# Patient Record
Sex: Male | Born: 1960 | Race: White | Hispanic: No | Smoking: Former smoker
Health system: Southern US, Community
[De-identification: ages and names within clinical notes are randomized; demographics above are authoritative.]

## PROBLEM LIST (undated history)

## (undated) DIAGNOSIS — Z951 Presence of aortocoronary bypass graft: Secondary | ICD-10-CM

## (undated) DIAGNOSIS — I214 Non-ST elevation (NSTEMI) myocardial infarction: Secondary | ICD-10-CM

## (undated) DIAGNOSIS — E785 Hyperlipidemia, unspecified: Secondary | ICD-10-CM

## (undated) DIAGNOSIS — I779 Disorder of arteries and arterioles, unspecified: Secondary | ICD-10-CM

## (undated) DIAGNOSIS — E109 Type 1 diabetes mellitus without complications: Secondary | ICD-10-CM

## (undated) DIAGNOSIS — I739 Peripheral vascular disease, unspecified: Secondary | ICD-10-CM

## (undated) DIAGNOSIS — B1910 Unspecified viral hepatitis B without hepatic coma: Secondary | ICD-10-CM

## (undated) DIAGNOSIS — I2510 Atherosclerotic heart disease of native coronary artery without angina pectoris: Secondary | ICD-10-CM

## (undated) DIAGNOSIS — G43909 Migraine, unspecified, not intractable, without status migrainosus: Secondary | ICD-10-CM

## (undated) DIAGNOSIS — R030 Elevated blood-pressure reading, without diagnosis of hypertension: Secondary | ICD-10-CM

## (undated) DIAGNOSIS — R7301 Impaired fasting glucose: Secondary | ICD-10-CM

## (undated) HISTORY — DX: Morbid (severe) obesity due to excess calories: E66.01

## (undated) HISTORY — DX: Atherosclerotic heart disease of native coronary artery without angina pectoris: I25.10

## (undated) HISTORY — DX: Disorder of arteries and arterioles, unspecified: I77.9

## (undated) HISTORY — DX: Impaired fasting glucose: R73.01

## (undated) HISTORY — DX: Hyperlipidemia, unspecified: E78.5

## (undated) HISTORY — PX: ANTERIOR CERVICAL DECOMP/DISCECTOMY FUSION: SHX1161

## (undated) HISTORY — PX: BACK SURGERY: SHX140

## (undated) HISTORY — DX: Presence of aortocoronary bypass graft: Z95.1

## (undated) HISTORY — DX: Peripheral vascular disease, unspecified: I73.9

## (undated) HISTORY — DX: Elevated blood-pressure reading, without diagnosis of hypertension: R03.0

---

## 1998-06-23 ENCOUNTER — Emergency Department (HOSPITAL_COMMUNITY): Admission: EM | Admit: 1998-06-23 | Discharge: 1998-06-23 | Payer: Self-pay

## 2000-04-19 ENCOUNTER — Encounter: Payer: Self-pay | Admitting: Gastroenterology

## 2000-04-19 ENCOUNTER — Ambulatory Visit (HOSPITAL_COMMUNITY): Admission: RE | Admit: 2000-04-19 | Discharge: 2000-04-19 | Payer: Self-pay | Admitting: Gastroenterology

## 2001-12-08 ENCOUNTER — Encounter: Payer: Self-pay | Admitting: Emergency Medicine

## 2001-12-08 ENCOUNTER — Emergency Department (HOSPITAL_COMMUNITY): Admission: EM | Admit: 2001-12-08 | Discharge: 2001-12-08 | Payer: Self-pay | Admitting: Emergency Medicine

## 2014-09-03 ENCOUNTER — Ambulatory Visit (INDEPENDENT_AMBULATORY_CARE_PROVIDER_SITE_OTHER): Payer: BLUE CROSS/BLUE SHIELD | Admitting: Family Medicine

## 2014-09-03 VITALS — BP 138/86 | HR 97 | Temp 98.2°F | Resp 18 | Ht 71.0 in | Wt 301.0 lb

## 2014-09-03 DIAGNOSIS — E1165 Type 2 diabetes mellitus with hyperglycemia: Secondary | ICD-10-CM | POA: Diagnosis not present

## 2014-09-03 DIAGNOSIS — E118 Type 2 diabetes mellitus with unspecified complications: Secondary | ICD-10-CM

## 2014-09-03 DIAGNOSIS — E669 Obesity, unspecified: Secondary | ICD-10-CM | POA: Insufficient documentation

## 2014-09-03 DIAGNOSIS — IMO0002 Reserved for concepts with insufficient information to code with codable children: Secondary | ICD-10-CM | POA: Insufficient documentation

## 2014-09-03 MED ORDER — METFORMIN HCL 500 MG PO TABS: 500.0000 mg | ORAL_TABLET | Freq: Two times a day (BID) | ORAL | Status: DC

## 2014-09-03 NOTE — Patient Instructions (Signed)
We are going to start you back on treatment for your diabetes.  You do have diabetes for sure This is a chronic illness that generally does not go away, but which we will control with diet, exercise and medication as needed Please come by for fasting labs in 2 weeks or so.  You can come in just for a blood draw  In the meantime please start taking metformin. Take one in the evening for one week, then increase to twice a day  Please come and see me in one month in the office (you can make an appt if you like) to check on your progress.   As a diabetic, there are several things you can do to monitor your condition and maintain your health.  1. Check your feet daily for any skin breakdown 2. Exercise and keep track of your diet 3. Let us know before you run out of your medications 4. Get your annual flu shot, and ask if you need a pneumonia shot 5. Ask if you are up to date on your labs; you should have an A1c every 6 months, a urine protein test annually, and a cholesterol test annually.  Your doctor may decide to do labs more often if indicated 6. Take off your shoes and socks at each visit.  Be sure your doctor examines your feet.   7. Ask about your blood pressure.  Your goal is 130/ 80 or less 8. Get an annual eye exam.  Please ask your ophthalmologist to send us your report 9. Keep up with your dental cleanings and exams.

## 2014-09-03 NOTE — Progress Notes (Signed)
[facility] [address], [place] [id number] [phone]  Date:  09/03/2014   Name:  Joshua LeechFredrick F Hinchcliff   DOB:  11/28/1960   MRN:  [id number]  PCP:  No primary care provider on file.    Chief Complaint: establish pcp   History of Present Illness:  Joshua Howell is a 54 y.o. 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 However he was found to have glucose in his urine. A1c is 10%.  He is not currently on any medication.  He was first dx a year ago. Was on metformin for a little while but he is no longer taking this. His old doctor moved away, so he stopped following up.  He states that he was told he could stop the medication however prior to his doctor moving.   He is not fasting today; had a biscuit this am His wife of 30 years passed away 3 years ago; he tends to eat to deal with loneliness.   He is aware that he needs to lose weight and has been able to lose weight in the past  There are no active problems to display for this patient.   Past Medical History  Diagnosis Date  . Diabetes mellitus without complication     Past Surgical History  Procedure Laterality Date  . Neck surgery      History  Substance Use Topics  . Smoking status: Former Games developermoker  . Smokeless tobacco: Not on file  . Alcohol Use: Not on file    History reviewed. No pertinent family history.  No Known Allergies  Medication list has been reviewed and updated.  No current outpatient prescriptions on file prior to visit.   No current facility-administered medications on file prior to visit.    Review of Systems:  As per HPI- otherwise negative.   Physical Examination: Filed Vitals:   09/03/14 1223  BP: 138/86  Pulse: 97  Temp: 98.2 F (36.8 C)  Resp: 18   Filed Vitals:   09/03/14 1223  Height: 5\' 11"  (1.803 m)  Weight: 301 lb (136.533 kg)   Body mass index is 42 kg/(m^2). Ideal Body Weight: Weight in (lb) to  have BMI = 25: 178.9  GEN: WDWN, NAD, Non-toxic, A & O x 3, morbid obesity HEENT: Atraumatic, Normocephalic. Neck supple. No masses, No LAD. Ears and Nose: No external deformity. CV: RRR, No M/G/R. No JVD. No thrill. No extra heart sounds. PULM: CTA B, no wheezes, crackles, rhonchi. No retractions. No resp. distress. No accessory muscle use. ABD: S, NT, ND. No rebound. No HSM. EXTR: No c/c/e NEURO Normal gait.  PSYCH: Normally interactive. Conversant. Not depressed or anxious appearing.  Calm demeanor.    Assessment and Plan: Diabetes mellitus type 2, uncontrolled - Plan: metFORMIN (GLUCOPHAGE) 500 MG tablet, Comprehensive metabolic panel, Lipid panel  Morbid obesity  Here today for a DOT exam. Added a personal PE when he was found to have uncontrolled DM with A1c of 10%, not currently on any tx.  Will restart metformin 500 qd, increase to BID He is not fasting today; will have him come in for fasting labs as avove in 2 weeks, plan to recheck progress in clinic in one month  Signed Abbe AmsterdamJessica Phenix Vandermeulen, MD

## 2014-09-17 ENCOUNTER — Other Ambulatory Visit (INDEPENDENT_AMBULATORY_CARE_PROVIDER_SITE_OTHER): Payer: BLUE CROSS/BLUE SHIELD | Admitting: Radiology

## 2014-09-17 DIAGNOSIS — E1165 Type 2 diabetes mellitus with hyperglycemia: Secondary | ICD-10-CM

## 2014-09-17 DIAGNOSIS — IMO0002 Reserved for concepts with insufficient information to code with codable children: Secondary | ICD-10-CM

## 2014-09-17 LAB — COMPREHENSIVE METABOLIC PANEL
ALT: 23 U/L (ref 0–53)
AST: 18 U/L (ref 0–37)
Albumin: 4.4 g/dL (ref 3.5–5.2)
Alkaline Phosphatase: 46 U/L (ref 39–117)
BUN: 13 mg/dL (ref 6–23)
CO2: 27 mEq/L (ref 19–32)
Calcium: 9.3 mg/dL (ref 8.4–10.5)
Chloride: 101 mEq/L (ref 96–112)
Creat: 1.08 mg/dL (ref 0.50–1.35)
Glucose, Bld: 144 mg/dL — ABNORMAL HIGH (ref 70–99)
Potassium: 4.8 mEq/L (ref 3.5–5.3)
Sodium: 137 mEq/L (ref 135–145)
Total Bilirubin: 1.7 mg/dL — ABNORMAL HIGH (ref 0.2–1.2)
Total Protein: 7.4 g/dL (ref 6.0–8.3)

## 2014-09-17 LAB — LIPID PANEL
Cholesterol: 149 mg/dL (ref 0–200)
HDL: 40 mg/dL (ref >=40)
LDL Cholesterol: 72 mg/dL (ref 0–99)
Total CHOL/HDL Ratio: 3.7 Ratio
Triglycerides: 185 mg/dL — ABNORMAL HIGH (ref <150)
VLDL: 37 mg/dL (ref 0–40)

## 2014-09-17 NOTE — Progress Notes (Signed)
Lab only 

## 2014-09-18 ENCOUNTER — Encounter: Payer: Self-pay | Admitting: Family Medicine

## 2016-05-11 DIAGNOSIS — J0100 Acute maxillary sinusitis, unspecified: Secondary | ICD-10-CM | POA: Diagnosis not present

## 2016-05-30 ENCOUNTER — Institutional Professional Consult (permissible substitution): Payer: BLUE CROSS/BLUE SHIELD | Admitting: Medical

## 2016-06-06 ENCOUNTER — Ambulatory Visit (INDEPENDENT_AMBULATORY_CARE_PROVIDER_SITE_OTHER): Payer: BLUE CROSS/BLUE SHIELD | Admitting: Medical

## 2016-06-06 ENCOUNTER — Encounter: Payer: Self-pay | Admitting: Medical

## 2016-06-06 ENCOUNTER — Telehealth: Payer: Self-pay | Admitting: Medical

## 2016-06-06 VITALS — BP 140/98 | HR 86 | Ht 72.0 in | Wt 297.6 lb

## 2016-06-06 DIAGNOSIS — R7301 Impaired fasting glucose: Secondary | ICD-10-CM | POA: Diagnosis not present

## 2016-06-06 DIAGNOSIS — R009 Unspecified abnormalities of heart beat: Secondary | ICD-10-CM

## 2016-06-06 DIAGNOSIS — J0110 Acute frontal sinusitis, unspecified: Secondary | ICD-10-CM

## 2016-06-06 DIAGNOSIS — I10 Essential (primary) hypertension: Secondary | ICD-10-CM | POA: Diagnosis not present

## 2016-06-06 DIAGNOSIS — R0683 Snoring: Secondary | ICD-10-CM | POA: Diagnosis not present

## 2016-06-06 LAB — COMPREHENSIVE METABOLIC PANEL
ALT: 18 U/L (ref 9–46)
AST: 18 U/L (ref 10–35)
Albumin: 4.1 g/dL (ref 3.6–5.1)
Alkaline Phosphatase: 52 U/L (ref 40–115)
BUN: 13 mg/dL (ref 7–25)
CO2: 23 mmol/L (ref 20–31)
Calcium: 9.6 mg/dL (ref 8.6–10.3)
Chloride: 104 mmol/L (ref 98–110)
Creat: 1.08 mg/dL (ref 0.70–1.33)
Glucose, Bld: 129 mg/dL — ABNORMAL HIGH (ref 65–99)
Potassium: 4.3 mmol/L (ref 3.5–5.3)
Sodium: 137 mmol/L (ref 135–146)
Total Bilirubin: 1.5 mg/dL — ABNORMAL HIGH (ref 0.2–1.2)
Total Protein: 7.3 g/dL (ref 6.1–8.1)

## 2016-06-06 LAB — CBC
HEMATOCRIT: 48.6 % (ref 38.5–50.0)
HEMOGLOBIN: 16.4 g/dL (ref 13.2–17.1)
MCH: 31.9 pg (ref 27.0–33.0)
MCHC: 33.7 g/dL (ref 32.0–36.0)
MCV: 94.6 fL (ref 80.0–100.0)
MPV: 11.1 fL (ref 7.5–12.5)
Platelets: 227 10*3/uL (ref 140–400)
RBC: 5.14 MIL/uL (ref 4.20–5.80)
RDW: 13.6 % (ref 11.0–15.0)
WBC: 8.6 10*3/uL (ref 4.0–10.5)

## 2016-06-06 LAB — LIPID PANEL
Cholesterol: 168 mg/dL (ref <200)
HDL: 40 mg/dL — ABNORMAL LOW (ref >40)
LDL Cholesterol: 74 mg/dL (ref <100)
Total CHOL/HDL Ratio: 4.2 Ratio (ref <5.0)
Triglycerides: 268 mg/dL — ABNORMAL HIGH (ref <150)
VLDL: 54 mg/dL — ABNORMAL HIGH (ref <30)

## 2016-06-06 LAB — TSH: TSH: 1.86 mIU/L (ref 0.40–4.50)

## 2016-06-06 MED ORDER — AMOXICILLIN 500 MG PO TABS: ORAL_TABLET | ORAL | 0 refills | Status: DC

## 2016-06-06 NOTE — Progress Notes (Signed)
Subjective: Chief Complaint  Patient presents with  . new patient    new patient , needs sleep study for  work.   Here as a new patient.  Here with his daughter today.  Here to establish care.   Recently had  DOT physical, was advised he needed a sleep study, was given temporary clearance.    Works daily, does [employer].  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    No prior sleep study.  Has hx/o impaired glucose, elevated BPs, was  Metformin in the psat for elevated sugars.  Never been on BP medication.    Thinks he still has sinus infection.  Was seen by urgent care 2 weeks ago for same, but despite a round of antibiotic, it didn't clear up.  Still has sinus pressure, congestion, headache, post nasal drainage.  No fever, no NVD, no SOB.   Regarding sleep, no hx/o apnea, no daytime somnolence, no fatigue, but is known to snore, has 17" neck.    Past Medical History:  Diagnosis Date  . Elevated blood-pressure reading without diagnosis of hypertension   . Impaired fasting blood sugar   . Morbid obesity (HCC)    Current Outpatient Prescriptions on File Prior to Visit  Medication Sig Dispense Refill  . DiphenhydrAMINE HCl (BENADRYL ALLERGY PO) Take by mouth.    . metFORMIN (GLUCOPHAGE) 500 MG tablet Take 1 tablet (500 mg total) by mouth 2 (two) times daily with a meal. (Patient not taking: Reported on 06/06/2016) 180 tablet 3   No current facility-administered medications on file prior to visit.    ROS as in subjective   Objective: BP (!) 140/98 (BP Location: Right Arm, Patient Position: Sitting)   Pulse 86   Ht 6' (1.829 m)   Wt 297 lb 9.6 oz (135 kg)   SpO2 95%   BMI 40.36 kg/m   BP Readings from Last 3 Encounters:  06/06/16 (!) 140/98  09/03/14 138/86   Wt Readings from Last 3 Encounters:  06/06/16 297 lb 9.6 oz (135 kg)  09/03/14 (!) 301 lb (136.5 kg)   General appearance: Alert, WD/WN, no  distress                             Skin: warm, no rash                           Head: + frontal sinus tenderness,                            Eyes: conjunctiva normal, corneas clear, PERRLA                            Ears: pearly TMs, external ear canals normal                          Nose: septum midline, turbinates swollen, with erythema and clear discharge             Mouth/throat: MMM, tongue normal, mild pharyngeal erythema                           Neck: supple, no adenopathy, no thyromegaly, non tender  Heart: RRR, normal S1, S2, no murmurs                         Lungs: CTA bilaterally, no wheezes, rales, or rhonchi       Assessment: Encounter Diagnoses  Name Primary?  . Elevated heart rate with elevated blood pressure and diagnosis of hypertension Yes  . Morbid obesity (HCC)   . Snoring   . Impaired fasting blood sugar   . Acute frontal sinusitis, recurrence not specified     Plan: Discussed concerns.  Will pursue sleep study, labs today.  Discussed need to work on weight loss, healthy diet, monitoring BP.    Sinusitis - begin amoxicillin, rest, hydrate well.  [name] was seen today for new patient.  Diagnoses and all orders for this visit:  Elevated heart rate with elevated blood pressure and diagnosis of hypertension -     Comprehensive metabolic panel -     Lipid panel -     CBC -     TSH -     Hemoglobin A1c  Morbid obesity (HCC) -     Comprehensive metabolic panel -     Lipid panel -     CBC -     TSH -     Hemoglobin A1c  Snoring -     Comprehensive metabolic panel -     Lipid panel -     CBC -     TSH -     Hemoglobin A1c  Impaired fasting blood sugar -     Comprehensive metabolic panel -     Lipid panel -     CBC -     TSH -     Hemoglobin A1c  Acute frontal sinusitis, recurrence not specified  Other orders -     amoxicillin (AMOXIL) 500 MG tablet; 2 tablets po BID x 10 days

## 2016-06-06 NOTE — Telephone Encounter (Signed)
[facility]

## 2016-06-07 ENCOUNTER — Other Ambulatory Visit: Payer: Self-pay | Admitting: Medical

## 2016-06-07 ENCOUNTER — Other Ambulatory Visit: Payer: Self-pay

## 2016-06-07 DIAGNOSIS — E118 Type 2 diabetes mellitus with unspecified complications: Principal | ICD-10-CM

## 2016-06-07 DIAGNOSIS — IMO0002 Reserved for concepts with insufficient information to code with codable children: Secondary | ICD-10-CM

## 2016-06-07 DIAGNOSIS — E1165 Type 2 diabetes mellitus with hyperglycemia: Secondary | ICD-10-CM

## 2016-06-07 DIAGNOSIS — R0683 Snoring: Secondary | ICD-10-CM

## 2016-06-07 LAB — HEMOGLOBIN A1C
HEMOGLOBIN A1C: 7.8 % — AB (ref <5.7)
MEAN PLASMA GLUCOSE: 177 mg/dL

## 2016-06-07 MED ORDER — METFORMIN HCL 500 MG PO TABS: 500.0000 mg | ORAL_TABLET | Freq: Two times a day (BID) | ORAL | 2 refills | Status: DC

## 2016-06-07 NOTE — Telephone Encounter (Signed)
Sent  Referral for sleep study

## 2016-06-26 ENCOUNTER — Encounter: Payer: Self-pay | Admitting: Medical

## 2016-06-26 ENCOUNTER — Ambulatory Visit (INDEPENDENT_AMBULATORY_CARE_PROVIDER_SITE_OTHER): Payer: BLUE CROSS/BLUE SHIELD | Admitting: Medical

## 2016-06-26 VITALS — BP 140/88 | HR 86 | Wt 295.2 lb

## 2016-06-26 DIAGNOSIS — E782 Mixed hyperlipidemia: Secondary | ICD-10-CM | POA: Diagnosis not present

## 2016-06-26 DIAGNOSIS — R0683 Snoring: Secondary | ICD-10-CM

## 2016-06-26 DIAGNOSIS — E118 Type 2 diabetes mellitus with unspecified complications: Secondary | ICD-10-CM

## 2016-06-26 NOTE — Progress Notes (Signed)
Subjective: Chief Complaint  Patient presents with  . follow up dm , possible bloodwork    follow up dm    Here for f/u on recent visit.  I saw him on 06/06/16 for new patient regarding possible sleep apnea, elevated BP.   We did labs at that visit, and unfortunately labs were consisted with diabetes and abnormal lipids.   Here to discuss this.  When we called back about labs, we advised he begin Metformin, we also refereed for sleep study, and discussed need for weight loss.    No prior sleep study.  Has hx/o impaired glucose, elevated BPs, was  Metformin in the past for elevated sugars.  Never been on BP medication.    Regarding sleep, no hx/o apnea, no daytime somnolence, no fatigue, but is known to snore, has 17" neck.    Past Medical History:  Diagnosis Date  . Elevated blood-pressure reading without diagnosis of hypertension   . Impaired fasting blood sugar   . Morbid obesity (HCC)    Current Outpatient Prescriptions on File Prior to Visit  Medication Sig Dispense Refill  . metFORMIN (GLUCOPHAGE) 500 MG tablet Take 1 tablet (500 mg total) by mouth 2 (two) times daily with a meal. 180 tablet 2  . DiphenhydrAMINE HCl (BENADRYL ALLERGY PO) Take by mouth.     No current facility-administered medications on file prior to visit.    ROS as in subjective   Objective: BP 140/88   Pulse 86   Wt 295 lb 3.2 oz (133.9 kg)   SpO2 96%   BMI 40.04 kg/m   BP Readings from Last 3 Encounters:  06/26/16 140/88  06/06/16 (!) 140/98  09/03/14 138/86   Wt Readings from Last 3 Encounters:  06/26/16 295 lb 3.2 oz (133.9 kg)  06/06/16 297 lb 9.6 oz (135 kg)  09/03/14 (!) 301 lb (136.5 kg)   General appearance: Alert, WD/WN, no distress                             Assessment: Encounter Diagnoses  Name Primary?  . Diabetes mellitus with complication (HCC) Yes  . Mixed dyslipidemia   . Morbid obesity (HCC)   . Snoring      Plan: Diabetes - discussed diagnosis, possible  complications, diet, exercise, need for lifestyle changes.   discussed medications recommendations today.   C/t metformin that we just started him back on, gave script for glucometer and discussed daily testing.  F/u 79mo.  Mixed dyslipidemia - advised diet changes, c/t physical activity.  obesity - advised needed lifestyle changes, weight loss  Snoring, obesity, elevated BPs .  He declines sleep study for now.   Joshua Howell was seen today for follow up dm , possible bloodwork.  Diagnoses and all orders for this visit:  Diabetes mellitus with complication (HCC)  Mixed dyslipidemia  Morbid obesity (HCC)  Snoring

## 2016-06-26 NOTE — Patient Instructions (Signed)
Type 2 Diabetes  Diabetes is a long-lasting (chronic) disease.  With diabetes, either the pancreas does not make enough of a hormone called insulin, or the body has trouble using the insulin that is made.  Over time, diabetes can damage the eyes, kidneys, and nerves causing retinopathy, nephropathy, and neuropathy.  Diabetes puts you at risk for heart disease and peripheral vascular disease which can lead to heart attack, stroke, foot ulcers, and amputations.    Our goal and hopefully your goal is to manage your diabetes in such a way to slow the progression of the disease and do all we can to keep you healthy  Home Care:   Eat healthy, exercise regularly, limit alcohol, and don't smoke!  Check your blood sugar (glucose) once a day before breakfast, or as indicated by our discussion today.  Take your medications daily, don't run out of medications.  Learn about low blood sugar (hypoglycemia). Know how to treat it.  Wear a necklace or bracelet that says you have diabetes.  Check your feet every night for cuts, sores, blisters, and redness. Tell your medical provider if you have problems.  Maintain a normal body weight, or normal BMI - height to weight ratio of 20-25.  Ask me about this.  GET HELP RIGHT AWAY IF:  You have trouble keeping your blood sugar in target range.  You have problems with your medicines.  You are sick and not getting better after 24 hours.  You have a sore or wound that is not healing.  You have vision problems or changes.  You have a fever.  Diet: healthy diabetic diet as we discussed  Exercise regularly since it has beneficial effects on the heart and blood sugars. Exercising at least three times per week or 150 minutes per week can be as important as medication to a diabetic.  Find some form of exercise that you will enjoy doing regularly.  This can include walking, biking, kayaking, golfing, swimming, dance, aerobics, hiking, etc.  If you have joint  problems, many local gyms have equipment to accommodate people with specific needs.    Vaccinations:  Diabetics are at increased risk for infection, and illnesses can take longer to resolve.  Current vaccine recommendations include yearly Influenza (flu) vaccine (recommended in October), Pneumococcal vaccine, Hepatitis B vaccine series, Tdap (tetanus, diptheria and pertussis) vaccine every 10 years, and other age appropriate vaccinations.     Office visits:  We recommend routine medical care to make sure we are addressing prevention and issues as they arise.  Typically this could mean twice yearly or up to quarterly depending upon your unique health situation.  Exams should include a yearly physical, a yearly foot exam, and other examination as appropriate.  You should see an eye doctor yearly to help screen for and prevent blood vessel complications in your eyes.  Labs: Diabetics should have blood work done at least twice yearly to monitor your Hemoglobin A1C (a three-month average of your blood sugars) and your cholesterol.  You should have your urine and blood checked yearly to screen for kidney damage.  This may include creatinine and micro-albumin levels.  Other labs as appropriate.    Blood pressure goals:  Goal blood pressure in diabetics should be 130/80 or less. Monitoring your blood pressure with a home blood pressure cuff of your own is an excellent idea.  If you are prescribed medication for blood pressure, take your medication every day, and don't run out of medication.  Having high  blood pressure can damage your heart, eyes, kidneys, and put you at risk for heart attack and stroke.  Tobacco use:  If you smoke, dip or chew, quitting will reduce your risk of heart attack, stroke, peripheral vascular disease, and many cancers.    Diabetic Report Card for Joshua Howell  June 26, 2016  Below is a summary of recent tests related to your diabetes that can help you manage your health.    Hemoglobin A1C:  Your Hemoglobin A1C values should be less than 7. If these are greater than 7, you have a higher chance of having eye, heart, and kidney problems in the future.   Your most recent Hemoglobin A1C values were:  Hgb A1c MFr Bld (%)  Date Value  06/06/2016 7.8 (H)     Cholesterol:  Your LDL Cholesterol (bad cholesterol) values should be less than 100 mg/dL if you do not have cardiovascular disease.  The LDL should be less than 70 mg/dL if you do have cardiovascular disease.  If your LDL is consistently higher than 100 mg/dL, then your risk of heart attack and stroke increases yearly.   Your most recent LDL Cholesterol (bad cholesterol) results were:  LDL Cholesterol (mg/dL)  Date Value  16/02/9603 74  09/17/2014 72     Your HDL Cholesterol (good cholesterol) values should be higher than 40 mg/dL.  If your HDL is lower than 40 mg/dL, this increases your risk of heart attack and stroke.   Your most recent HDL Cholesterol (bad cholesterol) results were:  Lab Results  Component Value Date   CHOL 168 06/06/2016   HDL 40 (L) 06/06/2016   LDLCALC 74 06/06/2016   TRIG 268 (H) 06/06/2016   CHOLHDL 4.2 06/06/2016       Blood Pressure:  Your blood pressure values should be less than 130/80. Please contact me if your readings at home are consistently higher than this.   Your most recent blood pressure readings at our clinic were:  BP Readings from Last 3 Encounters:  06/26/16 140/88  06/06/16 (!) 140/98  09/03/14 138/86

## 2016-07-12 DIAGNOSIS — E109 Type 1 diabetes mellitus without complications: Secondary | ICD-10-CM

## 2016-07-12 HISTORY — DX: Type 1 diabetes mellitus without complications: E10.9

## 2016-09-24 ENCOUNTER — Other Ambulatory Visit: Payer: Self-pay

## 2016-09-24 ENCOUNTER — Encounter: Payer: Self-pay | Admitting: Medical

## 2016-09-24 ENCOUNTER — Ambulatory Visit (INDEPENDENT_AMBULATORY_CARE_PROVIDER_SITE_OTHER): Payer: BLUE CROSS/BLUE SHIELD | Admitting: Medical

## 2016-09-24 VITALS — BP 132/78 | HR 98 | Wt 289.6 lb

## 2016-09-24 DIAGNOSIS — E118 Type 2 diabetes mellitus with unspecified complications: Secondary | ICD-10-CM

## 2016-09-24 DIAGNOSIS — IMO0002 Reserved for concepts with insufficient information to code with codable children: Secondary | ICD-10-CM

## 2016-09-24 DIAGNOSIS — E782 Mixed hyperlipidemia: Secondary | ICD-10-CM | POA: Diagnosis not present

## 2016-09-24 DIAGNOSIS — E1165 Type 2 diabetes mellitus with hyperglycemia: Secondary | ICD-10-CM

## 2016-09-24 LAB — POCT GLYCOSYLATED HEMOGLOBIN (HGB A1C)

## 2016-09-24 MED ORDER — METFORMIN HCL 500 MG PO TABS: 500.0000 mg | ORAL_TABLET | Freq: Two times a day (BID) | ORAL | 2 refills | Status: DC

## 2016-09-24 NOTE — Progress Notes (Signed)
Subjective: Chief Complaint  Patient presents with  . Diabetes    diabetes check    Here for f/u.   Last visit we discussed his new diagnosis of diabetes and elevated lipids.   He has been compliant with Metformin 500mg  BID, tolerating it well.  He has made diet changes, less bread, less calories, is active on the job with exercise.  He is monitoring glucose, getting 100-166.   He has dropped a pant size and losing some weight.   No foot concerns.   No polyuria, no polydipsia.   Drinks coke zero.  No other aggravating or relieving factors. No other complaint.  Past Medical History:  Diagnosis Date  . Diabetes mellitus without complication (HCC) 07/2016  . Elevated blood-pressure reading without diagnosis of hypertension   . Impaired fasting blood sugar   . Morbid obesity (HCC)    Current Outpatient Prescriptions on File Prior to Visit  Medication Sig Dispense Refill  . DiphenhydrAMINE HCl (BENADRYL ALLERGY PO) Take by mouth.     No current facility-administered medications on file prior to visit.    ROS as in subjective    Objective: BP 132/78   Pulse 98   Wt 289 lb 9.6 oz (131.4 kg)   SpO2 96%   BMI 39.28 kg/m   BP Readings from Last 3 Encounters:  09/24/16 132/78  06/26/16 140/88  06/06/16 (!) 140/98   Wt Readings from Last 3 Encounters:  09/24/16 289 lb 9.6 oz (131.4 kg)  06/26/16 295 lb 3.2 oz (133.9 kg)  06/06/16 297 lb 9.6 oz (135 kg)   General appearance: alert, no distress, WD/WN,  HEENT: normocephalic, sclerae anicteric, TMs pearly, nares patent, no discharge or erythema, pharynx normal Oral cavity: MMM, no lesions Neck: supple, no lymphadenopathy, no thyromegaly, no masses Heart: RRR, normal S1, S2, no murmurs Lungs: CTA bilaterally, no wheezes, rhonchi, or rales Ext: no edema Pulses: 2+ symmetric, upper and lower extremities, normal cap refill      Assessment: Encounter Diagnoses  Name Primary?  . Diabetes mellitus with complication (HCC) Yes  .  Mixed dyslipidemia   . Morbid obesity (HCC)   . Uncontrolled type 2 diabetes mellitus with complication, without long-term current use of insulin (HCC)       Plan Congratulated him on his recent diet changes, weight loss, compliance with medications, glucose monitoring.   C/t efforts.  Hgba1C improved today.  C/t metformin but add samples of Jardiance.  Advised he call within a month regarding glucose readings and additional weight loss.     Joshua MendFredrick was seen today for diabetes.  Diagnoses and all orders for this visit:  Diabetes mellitus with complication (HCC) -     HgB A1c  Mixed dyslipidemia  Morbid obesity (HCC)  Uncontrolled type 2 diabetes mellitus with complication, without long-term current use of insulin (HCC) -     metFORMIN (GLUCOPHAGE) 500 MG tablet; Take 1 tablet (500 mg total) by mouth 2 (two) times daily with a meal.  Other orders -     empagliflozin (JARDIANCE) 10 MG TABS tablet; Take 10 mg by mouth daily.

## 2016-09-25 ENCOUNTER — Encounter: Payer: Self-pay | Admitting: Medical

## 2016-09-25 MED ORDER — METFORMIN HCL 500 MG PO TABS: 500.0000 mg | ORAL_TABLET | Freq: Two times a day (BID) | ORAL | 2 refills | Status: DC

## 2016-09-25 MED ORDER — EMPAGLIFLOZIN 10 MG PO TABS: 10.0000 mg | ORAL_TABLET | Freq: Every day | ORAL | 0 refills | Status: DC

## 2017-01-23 ENCOUNTER — Emergency Department (HOSPITAL_COMMUNITY): Payer: BLUE CROSS/BLUE SHIELD

## 2017-01-23 ENCOUNTER — Encounter: Payer: Self-pay | Admitting: Medical

## 2017-01-23 ENCOUNTER — Ambulatory Visit (INDEPENDENT_AMBULATORY_CARE_PROVIDER_SITE_OTHER): Payer: BLUE CROSS/BLUE SHIELD | Admitting: Medical

## 2017-01-23 ENCOUNTER — Encounter (HOSPITAL_COMMUNITY): Payer: Self-pay | Admitting: Emergency Medicine

## 2017-01-23 ENCOUNTER — Inpatient Hospital Stay (HOSPITAL_COMMUNITY)
Admission: EM | Admit: 2017-01-23 | Discharge: 2017-02-01 | DRG: 234 | Disposition: A | Discharge status: Home / Self Care | Payer: BLUE CROSS/BLUE SHIELD | Attending: Thoracic Surgery (Cardiothoracic Vascular Surgery) | Admitting: Thoracic Surgery (Cardiothoracic Vascular Surgery)

## 2017-01-23 VITALS — BP 144/92 | HR 90 | Wt 290.8 lb

## 2017-01-23 DIAGNOSIS — E669 Obesity, unspecified: Secondary | ICD-10-CM | POA: Diagnosis present

## 2017-01-23 DIAGNOSIS — J9811 Atelectasis: Secondary | ICD-10-CM | POA: Diagnosis not present

## 2017-01-23 DIAGNOSIS — Z7984 Long term (current) use of oral hypoglycemic drugs: Secondary | ICD-10-CM

## 2017-01-23 DIAGNOSIS — J90 Pleural effusion, not elsewhere classified: Secondary | ICD-10-CM | POA: Diagnosis not present

## 2017-01-23 DIAGNOSIS — E1169 Type 2 diabetes mellitus with other specified complication: Secondary | ICD-10-CM | POA: Diagnosis not present

## 2017-01-23 DIAGNOSIS — E1165 Type 2 diabetes mellitus with hyperglycemia: Secondary | ICD-10-CM | POA: Diagnosis present

## 2017-01-23 DIAGNOSIS — E782 Mixed hyperlipidemia: Secondary | ICD-10-CM | POA: Diagnosis not present

## 2017-01-23 DIAGNOSIS — D696 Thrombocytopenia, unspecified: Secondary | ICD-10-CM | POA: Diagnosis not present

## 2017-01-23 DIAGNOSIS — Z87891 Personal history of nicotine dependence: Secondary | ICD-10-CM | POA: Diagnosis not present

## 2017-01-23 DIAGNOSIS — Z8249 Family history of ischemic heart disease and other diseases of the circulatory system: Secondary | ICD-10-CM | POA: Diagnosis not present

## 2017-01-23 DIAGNOSIS — E871 Hypo-osmolality and hyponatremia: Secondary | ICD-10-CM | POA: Diagnosis not present

## 2017-01-23 DIAGNOSIS — R0600 Dyspnea, unspecified: Secondary | ICD-10-CM | POA: Insufficient documentation

## 2017-01-23 DIAGNOSIS — I214 Non-ST elevation (NSTEMI) myocardial infarction: Secondary | ICD-10-CM

## 2017-01-23 DIAGNOSIS — Z79899 Other long term (current) drug therapy: Secondary | ICD-10-CM | POA: Diagnosis not present

## 2017-01-23 DIAGNOSIS — Z951 Presence of aortocoronary bypass graft: Secondary | ICD-10-CM | POA: Diagnosis not present

## 2017-01-23 DIAGNOSIS — E118 Type 2 diabetes mellitus with unspecified complications: Secondary | ICD-10-CM

## 2017-01-23 DIAGNOSIS — I2510 Atherosclerotic heart disease of native coronary artery without angina pectoris: Secondary | ICD-10-CM | POA: Diagnosis not present

## 2017-01-23 DIAGNOSIS — Z01810 Encounter for preprocedural cardiovascular examination: Secondary | ICD-10-CM | POA: Diagnosis not present

## 2017-01-23 DIAGNOSIS — R0602 Shortness of breath: Secondary | ICD-10-CM | POA: Diagnosis not present

## 2017-01-23 DIAGNOSIS — R079 Chest pain, unspecified: Secondary | ICD-10-CM | POA: Diagnosis not present

## 2017-01-23 DIAGNOSIS — D62 Acute posthemorrhagic anemia: Secondary | ICD-10-CM | POA: Diagnosis not present

## 2017-01-23 DIAGNOSIS — IMO0002 Reserved for concepts with insufficient information to code with codable children: Secondary | ICD-10-CM | POA: Diagnosis present

## 2017-01-23 DIAGNOSIS — Z6838 Body mass index (BMI) 38.0-38.9, adult: Secondary | ICD-10-CM

## 2017-01-23 DIAGNOSIS — I081 Rheumatic disorders of both mitral and tricuspid valves: Secondary | ICD-10-CM | POA: Diagnosis not present

## 2017-01-23 DIAGNOSIS — R0609 Other forms of dyspnea: Secondary | ICD-10-CM | POA: Diagnosis not present

## 2017-01-23 DIAGNOSIS — I25110 Atherosclerotic heart disease of native coronary artery with unstable angina pectoris: Secondary | ICD-10-CM | POA: Diagnosis not present

## 2017-01-23 DIAGNOSIS — I10 Essential (primary) hypertension: Secondary | ICD-10-CM | POA: Diagnosis not present

## 2017-01-23 DIAGNOSIS — Z09 Encounter for follow-up examination after completed treatment for conditions other than malignant neoplasm: Secondary | ICD-10-CM

## 2017-01-23 DIAGNOSIS — Z981 Arthrodesis status: Secondary | ICD-10-CM | POA: Diagnosis not present

## 2017-01-23 DIAGNOSIS — Z4682 Encounter for fitting and adjustment of non-vascular catheter: Secondary | ICD-10-CM | POA: Diagnosis not present

## 2017-01-23 HISTORY — DX: Type 1 diabetes mellitus without complications: E10.9

## 2017-01-23 HISTORY — DX: Migraine, unspecified, not intractable, without status migrainosus: G43.909

## 2017-01-23 HISTORY — DX: Unspecified viral hepatitis B without hepatic coma: B19.10

## 2017-01-23 HISTORY — DX: Non-ST elevation (NSTEMI) myocardial infarction: I21.4

## 2017-01-23 LAB — BASIC METABOLIC PANEL
Anion gap: 8 (ref 5–15)
BUN: 15 mg/dL (ref 6–20)
CO2: 25 mmol/L (ref 22–32)
Calcium: 9.3 mg/dL (ref 8.9–10.3)
Chloride: 102 mmol/L (ref 101–111)
Creatinine, Ser: 1.21 mg/dL (ref 0.61–1.24)
GFR calc Af Amer: >60 mL/min (ref >60)
GFR calc non Af Amer: >60 mL/min (ref >60)
Glucose, Bld: 172 mg/dL — ABNORMAL HIGH (ref 65–99)
Potassium: 4.4 mmol/L (ref 3.5–5.1)
Sodium: 135 mmol/L (ref 135–145)

## 2017-01-23 LAB — GLUCOSE, CAPILLARY: GLUCOSE-CAPILLARY: 193 mg/dL — AB (ref 65–99)

## 2017-01-23 LAB — I-STAT TROPONIN, ED: Troponin i, poc: 1.61 ng/mL (ref 0.00–0.08)

## 2017-01-23 LAB — CBC
HCT: 49 % (ref 39.0–52.0)
Hemoglobin: 16.6 g/dL (ref 13.0–17.0)
MCH: 32.4 pg (ref 26.0–34.0)
MCHC: 33.9 g/dL (ref 30.0–36.0)
MCV: 95.7 fL (ref 78.0–100.0)
Platelets: 188 10*3/uL (ref 150–400)
RBC: 5.12 MIL/uL (ref 4.22–5.81)
RDW: 12.9 % (ref 11.5–15.5)
WBC: 8.6 10*3/uL (ref 4.0–10.5)

## 2017-01-23 MED ORDER — ASPIRIN 81 MG PO CHEW
324.0000 mg | CHEWABLE_TABLET | Freq: Once | ORAL | Status: AC
  Administered 2017-01-23: 324 mg via ORAL
  Filled 2017-01-23: qty 4

## 2017-01-23 MED ORDER — NITROGLYCERIN 0.4 MG SL SUBL: 0.4000 mg | SUBLINGUAL_TABLET | SUBLINGUAL | Status: DC | PRN

## 2017-01-23 MED ORDER — ASPIRIN EC 81 MG PO TBEC
81.0000 mg | DELAYED_RELEASE_TABLET | Freq: Every day | ORAL | Status: DC
  Administered 2017-01-25 – 2017-01-27 (×3): 81 mg via ORAL
  Filled 2017-01-23 (×3): qty 1

## 2017-01-23 MED ORDER — ATORVASTATIN CALCIUM 80 MG PO TABS
80.0000 mg | ORAL_TABLET | Freq: Every day | ORAL | Status: DC
  Administered 2017-01-24 – 2017-01-31 (×7): 80 mg via ORAL
  Filled 2017-01-23 (×7): qty 1

## 2017-01-23 MED ORDER — ACETAMINOPHEN 325 MG PO TABS: 650.0000 mg | ORAL_TABLET | ORAL | Status: DC | PRN

## 2017-01-23 MED ORDER — HEPARIN (PORCINE) IN NACL 100-0.45 UNIT/ML-% IJ SOLN
1200.0000 [IU]/h | INTRAMUSCULAR | Status: DC
  Administered 2017-01-23: 1200 [IU]/h via INTRAVENOUS
  Filled 2017-01-23: qty 250

## 2017-01-23 MED ORDER — METOPROLOL TARTRATE 12.5 MG HALF TABLET
12.5000 mg | ORAL_TABLET | Freq: Two times a day (BID) | ORAL | Status: DC
  Administered 2017-01-23 – 2017-01-27 (×9): 12.5 mg via ORAL
  Filled 2017-01-23 (×9): qty 1

## 2017-01-23 MED ORDER — HEPARIN BOLUS VIA INFUSION
4000.0000 [IU] | Freq: Once | INTRAVENOUS | Status: AC
  Administered 2017-01-23: 4000 [IU] via INTRAVENOUS
  Filled 2017-01-23: qty 4000

## 2017-01-23 MED ORDER — ONDANSETRON HCL 4 MG/2ML IJ SOLN: 4.0000 mg | Freq: Four times a day (QID) | INTRAMUSCULAR | Status: DC | PRN

## 2017-01-23 NOTE — Progress Notes (Signed)
ANTICOAGULATION CONSULT NOTE - Initial Consult  Pharmacy Consult for heparin Indication: chest pain/ACS  No Known Allergies  Patient Measurements:   Heparin Dosing Weight: 99 Kg  Vital Signs: Temp: 97.9 F (36.6 C) (09/12 1641) Temp Source: Oral (09/12 1641) BP: 124/72 (09/12 1641) Pulse Rate: 94 (09/12 1641)  Labs:  Recent Labs  01/23/17 1651  HGB 16.6  HCT 49.0  PLT 188  CREATININE 1.21    CrCl cannot be calculated (Unknown ideal weight.).   Medical History: Past Medical History:  Diagnosis Date  . Diabetes mellitus without complication (HCC) 07/2016  . Elevated blood-pressure reading without diagnosis of hypertension   . Impaired fasting blood sugar   . Morbid obesity (HCC)    Assessment: 56 yoM sent from urgent care with abnormal EKG and chest pain earlier this week. No anticoagulation PTA; Troponin 1.61; CBC WNL  Goal of Therapy:  Heparin level 0.3-0.7 units/ml Monitor platelets by anticoagulation protocol: Yes   Plan:  Give 4000 units bolus x 1 Start heparin infusion at 1200 units/hr Check anti-Xa level in 6 hours and daily while on heparin Continue to monitor H&H and platelets  Sheyli Horwitz L Vestal Crandall 01/23/2017,6:17 PM

## 2017-01-23 NOTE — ED Triage Notes (Signed)
Pt reports feeling like he had indigestion yesterday with some pain in upper back, saw his pcp today who did an ekg and sent him for further evaluation due to ekg looking abnormal. Pt denies pain today, resp e/u, nad.

## 2017-01-23 NOTE — H&P (Signed)
History & Physical    Patient ID: Joshua LeechFredrick F Thum MRN: [id number], DOB/AGE: 58/09/1960   Admit date: 01/23/2017   Primary Physician: Jac Canavanysinger, David S, PA-C Primary Cardiologist: None  Patient Profile    56 y o man with NSTEMI  Past Medical History    Past Medical History:  Diagnosis Date  . Diabetes mellitus without complication (HCC) 07/2016  . Elevated blood-pressure reading without diagnosis of hypertension   . Impaired fasting blood sugar   . Morbid obesity (HCC)     Past Surgical History:  Procedure Laterality Date  . NECK SURGERY       Allergies  No Known Allergies  History of Present Illness    Mr Leonie GreenLingle has DM otherwise no sign PMH. 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 Was on the way to work on Monday when he developed chest burning. Lasted for half a day. Nothing ,ade it better or worse. He came ot ED today upon urging from his friend. He went to urgent care first. There they were concerned about his ECG and [facility]. IN the ED he is pain free. No SOB, PND, orthopnea or LEE.   He visits with PCP regularly and has "ok" lipids, BP and A1c.   Home Medications    Prior to Admission medications   Medication Sig Start Date End Date Taking? Authorizing Provider  DiphenhydrAMINE HCl (BENADRYL ALLERGY PO) Take 25 mg by mouth as needed (allergies).    Yes [provider]  metFORMIN (GLUCOPHAGE) 500 MG tablet Take 1 tablet (500 mg total) by mouth 2 (two) times daily with a meal. 09/25/16  Yes Tysinger, Kermit Baloavid S, PA-C    Family History    Family History  Problem Relation Age of Onset  . Gallbladder disease Mother   . Aneurysm Mother        brain  . Stroke Mother   . Heart disease Father 55       3 vessel ABG  . Heart disease Brother 45       MI  . Other Brother        died of hemorrhage in head after fall    Social History    Social History   Social History  . Marital status: Married    Spouse name: N/A  . Number of children: N/A  .  Years of education: N/A   Occupational History  . Not on file.   Social History Main Topics  . Smoking status: Former Games developermoker  . Smokeless tobacco: Never Used  . Alcohol use No  . Drug use: No  . Sexual activity: Not on file   Other Topics Concern  . Not on file   Social History Narrative  . No narrative on file     Review of Systems    General:  No chills, fever, night sweats or weight changes.  Cardiovascular:  No chest pain, dyspnea on exertion, edema, orthopnea, palpitations, paroxysmal nocturnal dyspnea. Dermatological: No rash, lesions/masses Respiratory: No cough, dyspnea Urologic: No hematuria, dysuria Abdominal:   No nausea, vomiting, diarrhea, bright red blood per rectum, melena, or hematemesis Neurologic:  No visual changes, wkns, changes in mental status. All other systems reviewed and are otherwise negative except as noted above.  Physical Exam    Blood pressure 124/74, pulse 89, temperature 97.9 F (36.6 C), temperature source Oral, resp. rate 15, height 6' 0.05" (1.83 m), SpO2 97 %.  General: Pleasant, NAD Psych: Normal affect. Neuro: Alert and oriented X 3. Moves  all extremities spontaneously. HEENT: Normal  Neck: Supple without bruits or JVD. Lungs:  Resp regular and unlabored, CTA. Heart: RRR no s3, s4, or murmurs. Abdomen: Soft, non-tender, non-distended, BS + x 4.  Extremities: No clubbing, cyanosis or edema. DP/PT/Radials 2+ and equal bilaterally.  Labs    Troponin [facility] Test)  Recent Labs  01/23/17 1657  TROPIPOC 1.61*   No results for input(s): CKTOTAL, CKMB, TROPONINI in the last 72 hours. Lab Results  Component Value Date   WBC 8.6 01/23/2017   HGB 16.6 01/23/2017   HCT 49.0 01/23/2017   MCV 95.7 01/23/2017   PLT 188 01/23/2017    Recent Labs Lab 01/23/17 1651  NA 135  K 4.4  CL 102  CO2 25  BUN 15  CREATININE 1.21  CALCIUM 9.3  GLUCOSE 172*   Lab Results  Component Value Date   CHOL 168 06/06/2016   HDL 40  (L) 06/06/2016   LDLCALC 74 06/06/2016   TRIG 268 (H) 06/06/2016   No results found for: [facility]   Radiology Studies    Dg Chest 2 View  Result Date: 01/23/2017 CLINICAL DATA:  56 year old male with abnormal EKG. Epigastric pain, nausea and shortness of breath. EXAM: CHEST  2 VIEW COMPARISON:  None. FINDINGS: Lung volumes are at the upper limits of normal to mildly hyperinflated with some attenuation of upper lobe bronchovascular markings. Cardiac size and mediastinal contours are within normal limits. Visualized tracheal air column is within normal limits. No pneumothorax, pulmonary edema, pleural effusion or confluent pulmonary opacity. Mildly increased interstitial markings in both lungs. Osteopenia. No acute osseous abnormality identified. Negative visible bowel gas pattern. IMPRESSION: 1.  No acute cardiopulmonary abnormality. 2. Borderline to mild pulmonary hyperinflation with questionable upper lobe emphysema. Electronically Signed   By: Odessa Fleming M.D.   On: 01/23/2017 17:11    ECG & Cardiac Imaging    NSR, borderline ST elevation in I AVL  Assessment & Plan    Mr Brookover has a PMH of DM. No with NSTEMI. He has some high risk findings on ECG not fully meeting STEMI criteria. His trop is ~1. He is pain free.   Plan: - Admit to cardiology - TSH, A1c - Heparin drip, npo after MN. Initiated BB and Statin. ASA given - LHC tomorrow.   Signed, Macario Golds, MD 01/23/2017, 9:11 PM

## 2017-01-23 NOTE — ED Notes (Addendum)
Informed first nurse Arline AspCindy of troponin 1.61ng/mL

## 2017-01-23 NOTE — Progress Notes (Signed)
Subjective:  Joshua Howell is a 56 y.o. male who presents for chest pain.    Monday was at work, felt central chest pressure, SOB, nausea, lasted for hours, felt fatigue, coworker said he looked pale.   He rested, and after lunch felt a little better.  Thought it was indigestion.   Has felt relatively ok yesterday and today.  However, he reports DOE for the past month, relieved with rest.   No other specific chest pain events.     No hx/o HTN.  Diabetes diagnosed this year, recent glucose 135-140.    compliant with metformin.   He is a former smoker.    No other aggravating or relieving factors.    No other c/o.  The following portions of the patient's history were reviewed and updated as appropriate: allergies, current medications, past family history, past medical history, past social history, past surgical history and problem list.  ROS Otherwise as in subjective above   Past Medical History:  Diagnosis Date  . Diabetes mellitus without complication (HCC) 07/2016  . Elevated blood-pressure reading without diagnosis of hypertension   . Impaired fasting blood sugar   . Morbid obesity (HCC)    Current Outpatient Prescriptions on File Prior to Visit  Medication Sig Dispense Refill  . DiphenhydrAMINE HCl (BENADRYL ALLERGY PO) Take by mouth.    . metFORMIN (GLUCOPHAGE) 500 MG tablet Take 1 tablet (500 mg total) by mouth 2 (two) times daily with a meal. 180 tablet 2   No current facility-administered medications on file prior to visit.     Objective:Exam BP (!) 144/92   Pulse 90   Wt 290 lb 12.8 oz (131.9 kg)   SpO2 96%   BMI 39.44 kg/m   General appearance: alert, no distress, WD/WN, morbidly obese white male HEENT: normocephalic, sclerae anicteric, conjunctiva pink and moist, TMs pearly, nares patent, no discharge or erythema, pharynx normal Oral cavity: MMM, no lesions Neck: supple, no lymphadenopathy, no  thyromegaly, no masses Heart: RRR, normal S1, S2, no murmurs Lungs: CTA bilaterally, no wheezes, rhonchi, or rales Abdomen: +bs, soft, non tender, non distended, no masses, no hepatomegaly, no splenomegaly Pulses: 2+ radial pulses, 2+ pedal pulses, normal cap refill Ext: no edema Somewhat flushed in face, ext a little pale    Adult ECG Report  Indication: chest pain, DOE  Rate: 85 bpm  Rhythm: normal sinus rhythm and sinus arrhythmia  QRS Axis: 71 degrees  PR Interval:  QRS Duration:  QTc:  Conduction Disturbances: widened QRS in precordial leads, depression of ST wave III, possible IVCD  Other Abnormalities: none  Patient's cardiac risk factors are: advanced age (older than 25 for men, 23 for women), diabetes mellitus, family history of premature cardiovascular disease, male gender and obesity (BMI >= 30 kg/m2).  EKG comparison: none  Narrative Interpretation: abnormal EKG    Assessment: Encounter Diagnoses  Name Primary?  . Chest pain, unspecified type Yes  . DOE (dyspnea on exertion)   . Diabetes mellitus with complication (HCC)   . Mixed dyslipidemia   . Morbid obesity (HCC)   . Family history of premature CAD      Plan: Discussed abnormal EKG findings, no prior EKG to compare.   He hasn't had symptoms in the last 2 days but for the past month DOE, and significant chest pain Monday.    He has several cardiac risk factors, including male age 50yo, diabetic, morbid obesity, premature family hx/o of CAD.   After discussed  symptoms , he will report to the ED for evaluation now.  Declines EMS.      Follow up: to ED  Joshua Howell was seen today for chest pain.  Diagnoses and all orders for this visit:  Chest pain, unspecified type -     EKG 12-Lead -     Pulse oximetry (single); Future  DOE (dyspnea on exertion) -     EKG 12-Lead -     Pulse oximetry (single); Future  Diabetes mellitus with complication (HCC) -     EKG 12-Lead -     Pulse oximetry  (single); Future  Mixed dyslipidemia -     EKG 12-Lead -     Pulse oximetry (single); Future  Morbid obesity (HCC) -     EKG 12-Lead -     Pulse oximetry (single); Future  Family history of premature CAD -     EKG 12-Lead -     Pulse oximetry (single); Future

## 2017-01-23 NOTE — ED Provider Notes (Signed)
[facility]-EMERGENCY DEPT Provider Note   CSN: [id number] Arrival date & time: 01/23/17  1624     History   Chief Complaint Chief Complaint  Patient presents with  . Abnormal ECG    HPI Joshua Howell is a 56 y.o. male.  HPI   56 year old male with chest pain. Patient states for the last several weeks he has had dyspnea with exertion has been unusual for him. Monday he was driving his car when he had substernal chest pain which she describes as very severe indigestion and generally felt very tired. Symptoms slowly subsided and he has been asymptomatic since then. He went to be evaluated today after the encouragement of a coworker. He was then referred to the emergency room because of concerning symptoms and abnormal EKG. He states that he has not had further symptoms since Monday.  Past Medical History:  Diagnosis Date  . Diabetes mellitus without complication (HCC) 07/2016  . Elevated blood-pressure reading without diagnosis of hypertension   . Impaired fasting blood sugar   . Morbid obesity [facility](HCC)     Patient Active Problem List   Diagnosis Date Noted  . Family history of premature CAD 01/23/2017  . DOE (dyspnea on exertion) 01/23/2017  . Chest pain 01/23/2017  . Mixed dyslipidemia 06/26/2016  . Snoring 06/06/2016  . Diabetes mellitus with complication (HCC) 09/03/2014  . Morbid obesity (HCC) 09/03/2014    Past Surgical History:  Procedure Laterality Date  . NECK SURGERY         Home Medications    Prior to Admission medications   Medication Sig Start Date End Date Taking? Authorizing Provider  DiphenhydrAMINE HCl (BENADRYL ALLERGY PO) Take by mouth.    [provider]  metFORMIN (GLUCOPHAGE) 500 MG tablet Take 1 tablet (500 mg total) by mouth 2 (two) times daily with a meal. 09/25/16   Tysinger, Kermit Baloavid S, PA-C    Family History Family History  Problem Relation Age of Onset  . Gallbladder disease Mother   . Aneurysm Mother        brain  . Stroke  Mother   . Heart disease Father 55       3 vessel ABG  . Heart disease Brother 45       MI  . Other Brother        died of hemorrhage in head after fall    Social History Social History  Substance Use Topics  . Smoking status: Former Games developermoker  . Smokeless tobacco: Never Used  . Alcohol use No     Allergies   Patient has no known allergies.   Review of Systems Review of Systems  All systems reviewed and negative, other than as noted in HPI.   Physical Exam Updated Vital Signs BP 124/72   Pulse 94   Temp 97.9 F (36.6 C) (Oral)   Resp 16   SpO2 94%   Physical Exam  Constitutional: He appears well-developed and well-nourished. No distress.  Laying in bed. NAD. Obese. Fine looking beard.   HENT:  Head: Normocephalic and atraumatic.  Eyes: Conjunctivae are normal. Right eye exhibits no discharge. Left eye exhibits no discharge.  Neck: Neck supple.  Cardiovascular: Normal rate, regular rhythm and normal heart sounds.  Exam reveals no gallop and no friction rub.   No murmur heard. Pulmonary/Chest: Effort normal and breath sounds normal. No respiratory distress.  Abdominal: Soft. He exhibits no distension. There is no tenderness.  Musculoskeletal: He exhibits no edema or tenderness.  Neurological: He  is alert.  Skin: Skin is warm and dry.  Psychiatric: He has a normal mood and affect. His behavior is normal. Thought content normal.  Nursing note and vitals reviewed.    ED Treatments / Results  Labs (all labs ordered are listed, but only abnormal results are displayed) Labs Reviewed  BASIC METABOLIC PANEL - Abnormal; Notable for the following:       Result Value   Glucose, Bld 172 (*)    All other components within normal limits  I-STAT TROPONIN, ED - Abnormal; Notable for the following:    Troponin i, poc 1.61 (*)    All other components within normal limits  CBC    EKG  EKG Interpretation None       Radiology Dg Chest 2 View  Result Date:  01/23/2017 CLINICAL DATA:  56 year old male with abnormal EKG. Epigastric pain, nausea and shortness of breath. EXAM: CHEST  2 VIEW COMPARISON:  None. FINDINGS: Lung volumes are at the upper limits of normal to mildly hyperinflated with some attenuation of upper lobe bronchovascular markings. Cardiac size and mediastinal contours are within normal limits. Visualized tracheal air column is within normal limits. No pneumothorax, pulmonary edema, pleural effusion or confluent pulmonary opacity. Mildly increased interstitial markings in both lungs. Osteopenia. No acute osseous abnormality identified. Negative visible bowel gas pattern. IMPRESSION: 1.  No acute cardiopulmonary abnormality. 2. Borderline to mild pulmonary hyperinflation with questionable upper lobe emphysema. Electronically Signed   By: Odessa Fleming M.D.   On: 01/23/2017 17:11    Procedures Procedures (including critical care time)  CRITICAL CARE Performed by: Raeford Razor Total critical care time: 35 minutes Critical care time was exclusive of separately billable procedures and treating other patients. Critical care was necessary to treat or prevent imminent or life-threatening deterioration. Critical care was time spent personally by me on the following activities: development of treatment plan with patient and/or surrogate as well as nursing, discussions with consultants, evaluation of patient's response to treatment, examination of patient, obtaining history from patient or surrogate, ordering and performing treatments and interventions, ordering and review of laboratory studies, ordering and review of radiographic studies, pulse oximetry and re-evaluation of patient's condition.   Medications Ordered in ED Medications  aspirin chewable tablet 324 mg (not administered)     Initial Impression / Assessment and Plan / ED Course  I have reviewed the triage vital signs and the nursing notes.  Pertinent labs & imaging results that were  available during my care of the patient were reviewed by me and considered in my medical decision making (see chart for details).     56 year old male with concerning symptoms and a significantly elevated troponin.   EKG from doctor's office was reviewed. Sinus rhythm. Normal intervals. Poor R wave progression across precordium. Perhaps some mild ST depression inferiorly.  Patient states that he's been asymptomatic for the past 2 days although he seems to really downplay the extent of them. His troponin is significantly elevated. Actual event was probably on Monday based on his description. Will give him aspirin and heparin though. Will discuss with cardiology. Admission.    Final Clinical Impressions(s) / ED Diagnoses   Final diagnoses:        NSTEMI (non-ST elevated myocardial infarction) [facility])        New Prescriptions New Prescriptions   No medications on file     Raeford Razor, MD 02/02/17 [phone]

## 2017-01-24 ENCOUNTER — Other Ambulatory Visit: Payer: Self-pay | Admitting: *Deleted

## 2017-01-24 ENCOUNTER — Encounter (HOSPITAL_COMMUNITY): Payer: Self-pay | Admitting: Interventional Cardiology

## 2017-01-24 ENCOUNTER — Encounter (HOSPITAL_COMMUNITY)
Admission: EM | Disposition: A | Discharge status: Home / Self Care | Payer: Self-pay | Source: Home / Self Care | Attending: Cardiovascular Disease

## 2017-01-24 ENCOUNTER — Inpatient Hospital Stay (HOSPITAL_COMMUNITY): Payer: BLUE CROSS/BLUE SHIELD

## 2017-01-24 DIAGNOSIS — E118 Type 2 diabetes mellitus with unspecified complications: Secondary | ICD-10-CM

## 2017-01-24 DIAGNOSIS — Z8249 Family history of ischemic heart disease and other diseases of the circulatory system: Secondary | ICD-10-CM

## 2017-01-24 DIAGNOSIS — I2510 Atherosclerotic heart disease of native coronary artery without angina pectoris: Secondary | ICD-10-CM

## 2017-01-24 DIAGNOSIS — E782 Mixed hyperlipidemia: Secondary | ICD-10-CM

## 2017-01-24 DIAGNOSIS — I214 Non-ST elevation (NSTEMI) myocardial infarction: Principal | ICD-10-CM

## 2017-01-24 DIAGNOSIS — I25110 Atherosclerotic heart disease of native coronary artery with unstable angina pectoris: Secondary | ICD-10-CM

## 2017-01-24 DIAGNOSIS — I10 Essential (primary) hypertension: Secondary | ICD-10-CM

## 2017-01-24 HISTORY — PX: LEFT HEART CATH AND CORONARY ANGIOGRAPHY: CATH118249

## 2017-01-24 LAB — GLUCOSE, CAPILLARY
GLUCOSE-CAPILLARY: 121 mg/dL — AB (ref 65–99)
Glucose-Capillary: 151 mg/dL — ABNORMAL HIGH (ref 65–99)
Glucose-Capillary: 132 mg/dL — ABNORMAL HIGH (ref 65–99)
Glucose-Capillary: 114 mg/dL — ABNORMAL HIGH (ref 65–99)

## 2017-01-24 LAB — TSH: TSH: 3.112 u[IU]/mL (ref 0.350–4.500)

## 2017-01-24 LAB — PROTIME-INR
INR: 1.01
Prothrombin Time: 13.2 seconds (ref 11.4–15.2)

## 2017-01-24 LAB — HEPATIC FUNCTION PANEL
ALT: 24 U/L (ref 17–63)
AST: 33 U/L (ref 15–41)
Albumin: 3.7 g/dL (ref 3.5–5.0)
Alkaline Phosphatase: 49 U/L (ref 38–126)
BILIRUBIN DIRECT: 0.3 mg/dL (ref 0.1–0.5)
BILIRUBIN TOTAL: 2.2 mg/dL — AB (ref 0.3–1.2)
Indirect Bilirubin: 1.9 mg/dL — ABNORMAL HIGH (ref 0.3–0.9)
Total Protein: 7.2 g/dL (ref 6.5–8.1)

## 2017-01-24 LAB — CBC
HCT: 45.6 % (ref 39.0–52.0)
Hemoglobin: 15.4 g/dL (ref 13.0–17.0)
MCH: 32.3 pg (ref 26.0–34.0)
MCHC: 33.8 g/dL (ref 30.0–36.0)
MCV: 95.6 fL (ref 78.0–100.0)
Platelets: 164 10*3/uL (ref 150–400)
RBC: 4.77 MIL/uL (ref 4.22–5.81)
RDW: 12.8 % (ref 11.5–15.5)
WBC: 8.2 10*3/uL (ref 4.0–10.5)

## 2017-01-24 LAB — ECHOCARDIOGRAM COMPLETE
Height: 72 in
Weight: 4525.6 oz

## 2017-01-24 LAB — TROPONIN I
TROPONIN I: 3.02 ng/mL — AB (ref <0.03)
TROPONIN I: 2.54 ng/mL — AB (ref <0.03)
Troponin I: 3.26 ng/mL (ref <0.03)

## 2017-01-24 LAB — LIPID PANEL
CHOLESTEROL: 162 mg/dL (ref 0–200)
HDL: 43 mg/dL (ref >40)
LDL Cholesterol: 93 mg/dL (ref 0–99)
Total CHOL/HDL Ratio: 3.8 RATIO
Triglycerides: 130 mg/dL (ref <150)
VLDL: 26 mg/dL (ref 0–40)

## 2017-01-24 LAB — HEPARIN LEVEL (UNFRACTIONATED)
Heparin Unfractionated: 0.12 IU/mL — ABNORMAL LOW (ref 0.30–0.70)
Heparin Unfractionated: 0.14 IU/mL — ABNORMAL LOW (ref 0.30–0.70)

## 2017-01-24 LAB — HIV ANTIBODY (ROUTINE TESTING W REFLEX): HIV Screen 4th Generation wRfx: NONREACTIVE

## 2017-01-24 LAB — HEMOGLOBIN A1C
HEMOGLOBIN A1C: 7 % — AB (ref 4.8–5.6)
MEAN PLASMA GLUCOSE: 154.2 mg/dL

## 2017-01-24 LAB — T4, FREE: FREE T4: 0.85 ng/dL (ref 0.61–1.12)

## 2017-01-24 LAB — MRSA PCR SCREENING: MRSA by PCR: NEGATIVE

## 2017-01-24 SURGERY — LEFT HEART CATH AND CORONARY ANGIOGRAPHY
Anesthesia: LOCAL

## 2017-01-24 MED ORDER — MOVING RIGHT ALONG BOOK
Freq: Once | Status: AC
  Administered 2017-01-24: 13:00:00
  Filled 2017-01-24: qty 1

## 2017-01-24 MED ORDER — INSULIN ASPART 100 UNIT/ML ~~LOC~~ SOLN: 0.0000 [IU] | Freq: Every day | SUBCUTANEOUS | Status: DC

## 2017-01-24 MED ORDER — ONDANSETRON HCL 4 MG/2ML IJ SOLN: 4.0000 mg | Freq: Four times a day (QID) | INTRAMUSCULAR | Status: DC | PRN

## 2017-01-24 MED ORDER — IOPAMIDOL (ISOVUE-370) INJECTION 76%
INTRAVENOUS | Status: DC | PRN
  Administered 2017-01-24: 50 mL via INTRA_ARTERIAL

## 2017-01-24 MED ORDER — ~~LOC~~ CARDIAC SURGERY, PATIENT & FAMILY EDUCATION
Freq: Once | Status: AC
  Administered 2017-01-24: 21:00:00
  Filled 2017-01-24: qty 1

## 2017-01-24 MED ORDER — MIDAZOLAM HCL 2 MG/2ML IJ SOLN
INTRAMUSCULAR | Status: DC | PRN
  Administered 2017-01-24: 2 mg via INTRAVENOUS

## 2017-01-24 MED ORDER — VERAPAMIL HCL 2.5 MG/ML IV SOLN
INTRAVENOUS | Status: DC | PRN
  Administered 2017-01-24: 09:00:00 via INTRA_ARTERIAL

## 2017-01-24 MED ORDER — HEPARIN SODIUM (PORCINE) 1000 UNIT/ML IJ SOLN
INTRAMUSCULAR | Status: AC
  Filled 2017-01-24: qty 1

## 2017-01-24 MED ORDER — SODIUM CHLORIDE 0.9% FLUSH: 3.0000 mL | INTRAVENOUS | Status: DC | PRN

## 2017-01-24 MED ORDER — ACETAMINOPHEN 325 MG PO TABS: 650.0000 mg | ORAL_TABLET | ORAL | Status: DC | PRN

## 2017-01-24 MED ORDER — FENTANYL CITRATE (PF) 100 MCG/2ML IJ SOLN
INTRAMUSCULAR | Status: AC
  Filled 2017-01-24: qty 2

## 2017-01-24 MED ORDER — INSULIN ASPART 100 UNIT/ML ~~LOC~~ SOLN
0.0000 [IU] | Freq: Three times a day (TID) | SUBCUTANEOUS | Status: DC
  Administered 2017-01-24 – 2017-01-27 (×7): 2 [IU] via SUBCUTANEOUS

## 2017-01-24 MED ORDER — LIDOCAINE HCL (PF) 1 % IJ SOLN
INTRAMUSCULAR | Status: AC
  Filled 2017-01-24: qty 30

## 2017-01-24 MED ORDER — HEPARIN (PORCINE) IN NACL 100-0.45 UNIT/ML-% IJ SOLN: 1550.0000 [IU]/h | INTRAMUSCULAR | Status: DC

## 2017-01-24 MED ORDER — PERFLUTREN LIPID MICROSPHERE
1.0000 mL | INTRAVENOUS | Status: AC | PRN
  Administered 2017-01-24: 12:00:00 2 mL via INTRAVENOUS
  Filled 2017-01-24: qty 10

## 2017-01-24 MED ORDER — FENTANYL CITRATE (PF) 100 MCG/2ML IJ SOLN
INTRAMUSCULAR | Status: DC | PRN
  Administered 2017-01-24: 25 ug via INTRAVENOUS

## 2017-01-24 MED ORDER — HEPARIN (PORCINE) IN NACL 2-0.9 UNIT/ML-% IJ SOLN
INTRAMUSCULAR | Status: AC
  Filled 2017-01-24: qty 1000

## 2017-01-24 MED ORDER — SODIUM CHLORIDE 0.9 % IV SOLN
INTRAVENOUS | Status: AC
  Administered 2017-01-24: 10:00:00 via INTRAVENOUS

## 2017-01-24 MED ORDER — LIDOCAINE HCL (PF) 1 % IJ SOLN
INTRAMUSCULAR | Status: DC | PRN
  Administered 2017-01-24: 5 mL

## 2017-01-24 MED ORDER — HEPARIN (PORCINE) IN NACL 100-0.45 UNIT/ML-% IJ SOLN
1800.0000 [IU]/h | INTRAMUSCULAR | Status: DC
  Administered 2017-01-24: 16:00:00 1550 [IU]/h via INTRAVENOUS
  Administered 2017-01-25 – 2017-01-27 (×4): 1800 [IU]/h via INTRAVENOUS
  Filled 2017-01-24 (×6): qty 250

## 2017-01-24 MED ORDER — SODIUM CHLORIDE 0.9 % IV SOLN
250.0000 mL | INTRAVENOUS | Status: DC | PRN
  Administered 2017-01-28 (×2): via INTRAVENOUS

## 2017-01-24 MED ORDER — MOVING RIGHT ALONG BOOK
Freq: Once | Status: AC
  Administered 2017-01-24: 21:00:00
  Filled 2017-01-24: qty 1

## 2017-01-24 MED ORDER — PERFLUTREN LIPID MICROSPHERE
INTRAVENOUS | Status: AC
  Filled 2017-01-24: qty 10

## 2017-01-24 MED ORDER — SODIUM CHLORIDE 0.9 % IV SOLN: 250.0000 mL | INTRAVENOUS | Status: DC | PRN

## 2017-01-24 MED ORDER — ASPIRIN 81 MG PO CHEW
CHEWABLE_TABLET | ORAL | Status: AC
  Filled 2017-01-24: qty 4

## 2017-01-24 MED ORDER — HEPARIN BOLUS VIA INFUSION
2500.0000 [IU] | Freq: Once | INTRAVENOUS | Status: AC
  Administered 2017-01-24: 2500 [IU] via INTRAVENOUS
  Filled 2017-01-24: qty 2500

## 2017-01-24 MED ORDER — IOPAMIDOL (ISOVUE-370) INJECTION 76%
INTRAVENOUS | Status: AC
  Filled 2017-01-24: qty 100

## 2017-01-24 MED ORDER — SODIUM CHLORIDE 0.9 % WEIGHT BASED INFUSION: 1.0000 mL/kg/h | INTRAVENOUS | Status: DC

## 2017-01-24 MED ORDER — MIDAZOLAM HCL 2 MG/2ML IJ SOLN
INTRAMUSCULAR | Status: AC
  Filled 2017-01-24: qty 2

## 2017-01-24 MED ORDER — HEPARIN (PORCINE) IN NACL 2-0.9 UNIT/ML-% IJ SOLN
INTRAMUSCULAR | Status: AC | PRN
  Administered 2017-01-24: 1000 mL

## 2017-01-24 MED ORDER — VERAPAMIL HCL 2.5 MG/ML IV SOLN
INTRAVENOUS | Status: AC
  Filled 2017-01-24: qty 2

## 2017-01-24 MED ORDER — SODIUM CHLORIDE 0.9 % WEIGHT BASED INFUSION
3.0000 mL/kg/h | INTRAVENOUS | Status: DC
  Administered 2017-01-24: 3 mL/kg/h via INTRAVENOUS

## 2017-01-24 MED ORDER — ASPIRIN 81 MG PO CHEW
324.0000 mg | CHEWABLE_TABLET | Freq: Once | ORAL | Status: DC
  Administered 2017-01-24: 11:00:00 324 mg via ORAL

## 2017-01-24 MED ORDER — HEPARIN SODIUM (PORCINE) 1000 UNIT/ML IJ SOLN
INTRAMUSCULAR | Status: DC | PRN
  Administered 2017-01-24: 6000 [IU] via INTRAVENOUS

## 2017-01-24 MED ORDER — SODIUM CHLORIDE 0.9% FLUSH
3.0000 mL | Freq: Two times a day (BID) | INTRAVENOUS | Status: DC
  Administered 2017-01-25 (×2): 3 mL via INTRAVENOUS

## 2017-01-24 MED ORDER — ~~LOC~~ CARDIAC SURGERY, PATIENT & FAMILY EDUCATION
Freq: Once | Status: AC
  Administered 2017-01-24: 13:00:00
  Filled 2017-01-24: qty 1

## 2017-01-24 MED ORDER — SODIUM CHLORIDE 0.9% FLUSH: 3.0000 mL | Freq: Two times a day (BID) | INTRAVENOUS | Status: DC

## 2017-01-24 SURGICAL SUPPLY — 9 items
CATH 5FR JL3.5 JR4 ANG PIG MP (CATHETERS) ×2 IMPLANT
DEVICE RAD COMP TR BAND LRG (VASCULAR PRODUCTS) ×2 IMPLANT
GLIDESHEATH SLEND SS 6F .021 (SHEATH) ×2 IMPLANT
GUIDEWIRE INQWIRE 1.5J.035X260 (WIRE) ×1 IMPLANT
INQWIRE 1.5J .035X260CM (WIRE) ×2
KIT HEART LEFT (KITS) ×2 IMPLANT
PACK CARDIAC CATHETERIZATION (CUSTOM PROCEDURE TRAY) ×2 IMPLANT
TRANSDUCER W/STOPCOCK (MISCELLANEOUS) ×2 IMPLANT
TUBING CIL FLEX 10 FLL-RA (TUBING) ×2 IMPLANT

## 2017-01-24 NOTE — Progress Notes (Signed)
ANTICOAGULATION CONSULT NOTE - Follow Up Consult  Pharmacy Consult for Heparin Indication: 3VCAD  No Known Allergies  Patient Measurements: Height: 6' (182.9 cm) Weight: 282 lb 13.6 oz (128.3 kg) IBW/kg (Calculated) : 77.6 Heparin Dosing Weight: 99 kg  Vital Signs: Temp: 98.6 F (37 C) (09/13 1446) Temp Source: Oral (09/13 1446) BP: 100/67 (09/13 1900) Pulse Rate: 59 (09/13 1825)  Labs:  Recent Labs  01/23/17 1651 01/24/17 0116 01/24/17 0640 01/24/17 1248 01/24/17 1902 01/24/17 2109  HGB 16.6 15.4  --   --   --   --   HCT 49.0 45.6  --   --   --   --   PLT 188 164  --   --   --   --   LABPROT  --   --   --  13.2  --   --   INR  --   --   --  1.01  --   --   HEPARINUNFRC  --  0.12*  --   --   --  0.14*  CREATININE 1.21  --   --   --   --   --   TROPONINI  --   --  3.02* 2.54* 3.26*  --     Estimated Creatinine Clearance: 92.1 mL/min (by C-G formula based on SCr of 1.21 mg/dL).   Assessment:  Anticoag: none PTA; hep gtt for ACS s/p cath 9/13 (resume 6 hours post sheath; removed ~ 9:30, radial). 3VCAD for CABG consult. PM HL 0.14 low  Goal of Therapy:  Heparin level 0.3-0.7 units/ml Monitor platelets by anticoagulation protocol: Yes   Plan:  Increase IV heparin to 1800 units/hr Daily HL and CBC CABG 9/17   Rameses Ou S. Merilynn Finlandobertson, PharmD, BCPS Clinical Staff Pharmacist Pager [phone]  Misty Stanleyobertson, Rithy Mandley Stillinger 01/24/2017,9:52 PM

## 2017-01-24 NOTE — Progress Notes (Signed)
  Echocardiogram 2D Echocardiogram has been performed.  Joshua PartridgeBrooke S Glendia Olshefski 01/24/2017, 12:14 PM

## 2017-01-24 NOTE — Progress Notes (Signed)
Progress Note  Patient Name: Joshua LeechFredrick F Howell Date of Encounter: 01/24/2017  Primary Cardiologist: New, will be Dr Delton SeeNelson  Patient Profile     56 y.o. male w/ hx DM, ?HTN, morbid obesity was admitted 09/12 w/ NSTEMI, initial trop 1.61  Subjective   Initially, he thought CP was indigestion. It is currently gone. No SOB.   Inpatient Medications    Scheduled Meds: . aspirin  324 mg Oral Once  . aspirin EC  81 mg Oral Daily  . atorvastatin  80 mg Oral q1800  . metoprolol tartrate  12.5 mg Oral BID  . sodium chloride flush  3 mL Intravenous Q12H   Continuous Infusions: . sodium chloride    . sodium chloride 3 mL/kg/hr (01/24/17 0800)   Followed by  . sodium chloride    . heparin 1,550 Units/hr (01/24/17 0600)   PRN Meds: sodium chloride, acetaminophen, nitroGLYCERIN, ondansetron (ZOFRAN) IV, sodium chloride flush   Vital Signs    Vitals:   01/24/17 0100 01/24/17 0200 01/24/17 0400 01/24/17 0722  BP: (!) 116/53 (!) 90/44 123/87 111/67  Pulse: 74 64 73 (!) 58  Resp: 17 15 17 15   Temp:   (!) 97.5 F (36.4 C) 97.6 F (36.4 C)  TempSrc:   Oral Oral  SpO2: 90% 96% 98% 96%  Weight:   282 lb 13.6 oz (128.3 kg)   Height:        Intake/Output Summary (Last 24 hours) at 01/24/17 0837 Last data filed at 01/24/17 0600  Gross per 24 hour  Intake           134.18 ml  Output              400 ml  Net          -265.82 ml   Filed Weights   01/23/17 2119 01/23/17 2237 01/24/17 0400  Weight: 290 lb (131.5 kg) 282 lb 13.6 oz (128.3 kg) 282 lb 13.6 oz (128.3 kg)    Telemetry    SR - Personally Reviewed  ECG    SR, late R wave progression, small Q waves seen, no ST elevation or depression - Personally Reviewed  Physical Exam   General: Well developed, well nourished, male appearing in no acute distress. Head: Normocephalic, atraumatic.  Neck: Supple without bruits, JVD not elevated but difficult to assess 2nd body habitus. Lungs:  Resp regular and unlabored,  CTA. Heart: RRR, S1, S2, no S3, S4, or murmur; no rub. Abdomen: Soft, non-tender, non-distended with normoactive bowel sounds. No hepatomegaly. No rebound/guarding. No obvious abdominal masses. Extremities: No clubbing, cyanosis, no edema. Distal pedal pulses are 2+ bilaterally. Neuro: Alert and oriented X 3. Moves all extremities spontaneously. Psych: Normal affect.  Labs    Hematology  Recent Labs Lab 01/23/17 1651 01/24/17 0116  WBC 8.6 8.2  RBC 5.12 4.77  HGB 16.6 15.4  HCT 49.0 45.6  MCV 95.7 95.6  MCH 32.4 32.3  MCHC 33.9 33.8  RDW 12.9 12.8  PLT 188 164    Chemistry  Recent Labs Lab 01/23/17 1651 01/24/17 0640  NA 135  --   K 4.4  --   CL 102  --   CO2 25  --   GLUCOSE 172*  --   BUN 15  --   CREATININE 1.21  --   CALCIUM 9.3  --   PROT  --  7.2  ALBUMIN  --  3.7  AST  --  33  ALT  --  24  ALKPHOS  --  49  BILITOT  --  2.2*  GFRNONAA >60  --   GFRAA >60  --   ANIONGAP 8  --      Cardiac Enzymes  Recent Labs Lab 01/24/17 0640  TROPONINI 3.02*     Recent Labs Lab 01/23/17 1657  TROPIPOC 1.61*     Radiology    Dg Chest 2 View  Result Date: 01/23/2017 CLINICAL DATA:  56 year old male with abnormal EKG. Epigastric pain, nausea and shortness of breath. EXAM: CHEST  2 VIEW COMPARISON:  None. FINDINGS: Lung volumes are at the upper limits of normal to mildly hyperinflated with some attenuation of upper lobe bronchovascular markings. Cardiac size and mediastinal contours are within normal limits. Visualized tracheal air column is within normal limits. No pneumothorax, pulmonary edema, pleural effusion or confluent pulmonary opacity. Mildly increased interstitial markings in both lungs. Osteopenia. No acute osseous abnormality identified. Negative visible bowel gas pattern. IMPRESSION: 1.  No acute cardiopulmonary abnormality. 2. Borderline to mild pulmonary hyperinflation with questionable upper lobe emphysema. Electronically Signed   By: Odessa Fleming  M.D.   On: 01/23/2017 17:11     Cardiac Studies   ECHO: 01/24/2017 - ordered   CATH: 01/24/2017 - ordered   Patient Profile     56 y.o. male w/ hx  DM, ?HTN, morbid obesity was admitted 09/12 w/ NSTEMI, initial trop 1.61  Assessment & Plan    Principal Problem:   NSTEMI (non-ST elevated myocardial infarction) (HCC) - no more CP, cath indicated - The risks and benefits of a cardiac catheterization including, but not limited to, death, stroke, MI, kidney damage and bleeding were discussed with the patient who indicates understanding and agrees to proceed.   Active Problems:   Diabetes mellitus with complication (HCC) - encourage diet compliance, hold metformin. - add SSI    Morbid obesity (HCC) - encourage healthier diet    Mixed dyslipidemia - lipid profile ordered, high-dose statin started   Joshua Howell 8:37 AM 01/24/2017 Pager: [phone]  The patient was seen, examined and discussed with Theodore Demark, PA-C and I agree with the above.   56 y.o. male w/ hx  DM, ?HTN, morbid obesity was admitted 09/12 w/ NSTEMI, initial trop 1.61, the patient underwent left cardiac cath this am with findings of 3 vessel disease and is awaiting CT surgery consult.  He is currently chest pain free, no SOB. He is advised to stop smoking, He has FH of premature CAD with his brother having MI in his early 72' and undergoing CABG surgery, still alive. His HR, BP rather low, he was started on aspirin, high dose atorvastatin and low dose metoprolol.  Echo is pending.  Joshua Alexander, MD 01/24/2017

## 2017-01-24 NOTE — Progress Notes (Signed)
ANTICOAGULATION CONSULT NOTE  Pharmacy Consult for heparin Indication: chest pain/ACS  No Known Allergies  Patient Measurements: Height: 6' (182.9 cm) Weight: 282 lb 13.6 oz (128.3 kg) IBW/kg (Calculated) : 77.6 Heparin Dosing Weight: 99 Kg  Vital Signs: Temp: 97.6 F (36.4 C) (09/13 0722) Temp Source: Oral (09/13 0722) BP: 107/75 (09/13 0920) Pulse Rate: 75 (09/13 0920)  Labs:  Recent Labs  01/23/17 1651 01/24/17 0116 01/24/17 0640  HGB 16.6 15.4  --   HCT 49.0 45.6  --   PLT 188 164  --   HEPARINUNFRC  --  0.12*  --   CREATININE 1.21  --   --   TROPONINI  --   --  3.02*    Estimated Creatinine Clearance: 92.1 mL/min (by C-G formula based on SCr of 1.21 mg/dL).   Medical History: Past Medical History:  Diagnosis Date  . Elevated blood-pressure reading without diagnosis of hypertension   . Hepatitis B   . Impaired fasting blood sugar   . Migraine    "only in [employer]" (01/23/2017)  . Morbid obesity (HCC)   . NSTEMI (non-ST elevated myocardial infarction) (HCC) 01/23/2017  . Type I diabetes mellitus (HCC) 07/2016   Assessment:  56 yoM sent from urgent care with abnormal EKG and chest pain earlier this week. No anticoagulation PTA. He is noted with NSTEMI s/p cath with 3VCAD for CABG consult. Pharmacy to resume heparin 6 hours post sheath pull (done at ~ 9: 30am)  Goal of Therapy:  Heparin level 0.3-0.7 units/ml Monitor platelets by anticoagulation protocol: Yes   Plan:  -resume heparin at 1550 units/hr at 3:30pm -Heparin level in 6 hours and daily wth CBC daily  Harland GermanAndrew Pretty Weltman, Pharm D 01/24/2017 10:13 AM

## 2017-01-24 NOTE — Consult Note (Signed)
Reason for Consult:3 vessel CAD Referring Physician: Dr. Doreen Beam is an 56 y.o. male.  HPI: 56 yo man with a history of morbid obesity, hypertension, type II diabetes without complication, and a family history of CAD who presented yesterday after a prolonged episode of CP 4 days ago. He has no prior history of CAD. On Monday while driving to work he developed a substernal CP. He thought it was indigestion, but more severe than he usually experiences. He had some relief with belching. He c/o feeling fatigued but the pain did not radiate and he did not have N/V or diaphoresis.Lasted several hours before resolving spontaneously.  He went to his primary on Thursday. An ECG was abnormal and he was sent to [facility]. He ruled in for an OOH MI with a troponin of 3.02. He has not had any additional pain since Monday. Today he unedrwent cardiac catheterization which revealed severe 3 vessel CAD.  Past Medical History:  Diagnosis Date  . Elevated blood-pressure reading without diagnosis of hypertension   . Hepatitis B   . Impaired fasting blood sugar   . Migraine    "only in [employer]" (01/23/2017)  . Morbid obesity (HCC)   . NSTEMI (non-ST elevated myocardial infarction) (HCC) 01/23/2017  . Type I diabetes mellitus (HCC) 07/2016    Past Surgical History:  Procedure Laterality Date  . ANTERIOR CERVICAL DECOMP/DISCECTOMY FUSION     "took a piece off my right hip hip & put it in there"  . BACK SURGERY    . LEFT HEART CATH AND CORONARY ANGIOGRAPHY N/A 01/24/2017   Procedure: LEFT HEART CATH AND CORONARY ANGIOGRAPHY;  Surgeon: Corky Crafts, MD;  Location: [facility] INVASIVE CV LAB;  Service: Cardiovascular;  Laterality: N/A;    Family History  Problem Relation Age of Onset  . Gallbladder disease Mother   . Aneurysm Mother        brain  . Stroke Mother   . Heart disease Father 55       3 vessel ABG  . Heart disease Brother 45       MI  . Other Brother        died of  hemorrhage in head after fall    Social History:  reports that he has quit smoking. His smoking use included Cigarettes. He has never used smokeless tobacco. He reports that he drinks alcohol. He reports that he does not use drugs.  Allergies: No Known Allergies  Medications:  Prior to Admission:  Prescriptions Prior to Admission  Medication Sig Dispense Refill Last Dose  . DiphenhydrAMINE HCl (BENADRYL ALLERGY PO) Take 25 mg by mouth as needed (allergies).    prn  . metFORMIN (GLUCOPHAGE) 500 MG tablet Take 1 tablet (500 mg total) by mouth 2 (two) times daily with a meal. 180 tablet 2 01/23/2017 at Unknown time    Results for orders placed or performed during the hospital encounter of 01/23/17 (from the past 48 hour(s))  Basic metabolic panel     Status: Abnormal   Collection Time: 01/23/17  4:51 PM  Result Value Ref Range   Sodium 135 135 - 145 mmol/L   Potassium 4.4 3.5 - 5.1 mmol/L   Chloride 102 101 - 111 mmol/L   CO2 25 22 - 32 mmol/L   Glucose, Bld 172 (H) 65 - 99 mg/dL   BUN 15 6 - 20 mg/dL   Creatinine, Ser 1.09 0.61 - 1.24 mg/dL   Calcium 9.3 8.9 - 10.3  mg/dL   GFR calc non Af Amer >60 >60 mL/min   GFR calc Af Amer >60 >60 mL/min    Comment: (NOTE) The eGFR has been calculated using the CKD EPI equation. This calculation has not been validated in all clinical situations. eGFR's persistently <60 mL/min signify possible Chronic Kidney Disease.    Anion gap 8 5 - 15  CBC     Status: None   Collection Time: 01/23/17  4:51 PM  Result Value Ref Range   WBC 8.6 4.0 - 10.5 K/uL   RBC 5.12 4.22 - 5.81 MIL/uL   Hemoglobin 16.6 13.0 - 17.0 g/dL   HCT 37.3 54.6 - 14.9 %   MCV 95.7 78.0 - 100.0 fL   MCH 32.4 26.0 - 34.0 pg   MCHC 33.9 30.0 - 36.0 g/dL   RDW 93.1 93.6 - 58.3 %   Platelets 188 150 - 400 K/uL  I-stat troponin, ED     Status: Abnormal   Collection Time: 01/23/17  4:57 PM  Result Value Ref Range   Troponin i, poc 1.61 (HH) 0.00 - 0.08 ng/mL   Comment  NOTIFIED PHYSICIAN    Comment 3            Comment: Due to the release kinetics of cTnI, a negative result within the first hours of the onset of symptoms does not rule out myocardial infarction with certainty. If myocardial infarction is still suspected, repeat the test at appropriate intervals.   MRSA PCR Screening     Status: None   Collection Time: 01/23/17 10:33 PM  Result Value Ref Range   MRSA by PCR NEGATIVE NEGATIVE    Comment:        The GeneXpert MRSA Assay (FDA approved for NASAL specimens only), is one component of a comprehensive MRSA colonization surveillance program. It is not intended to diagnose MRSA infection nor to guide or monitor treatment for MRSA infections.   Glucose, capillary     Status: Abnormal   Collection Time: 01/23/17 11:06 PM  Result Value Ref Range   Glucose-Capillary 193 (H) 65 - 99 mg/dL   Comment 1 Notify RN    Comment 2 Document in Chart   Heparin level (unfractionated)     Status: Abnormal   Collection Time: 01/24/17  1:16 AM  Result Value Ref Range   Heparin Unfractionated 0.12 (L) 0.30 - 0.70 IU/mL    Comment:        IF HEPARIN RESULTS ARE BELOW EXPECTED VALUES, AND PATIENT DOSAGE HAS BEEN CONFIRMED, SUGGEST FOLLOW UP TESTING OF ANTITHROMBIN III LEVELS.   CBC     Status: None   Collection Time: 01/24/17  1:16 AM  Result Value Ref Range   WBC 8.2 4.0 - 10.5 K/uL   RBC 4.77 4.22 - 5.81 MIL/uL   Hemoglobin 15.4 13.0 - 17.0 g/dL   HCT 64.9 40.2 - 70.0 %   MCV 95.6 78.0 - 100.0 fL   MCH 32.3 26.0 - 34.0 pg   MCHC 33.8 30.0 - 36.0 g/dL   RDW 43.2 97.1 - 02.4 %   Platelets 164 150 - 400 K/uL  HIV antibody (Routine Testing)     Status: None   Collection Time: 01/24/17  1:16 AM  Result Value Ref Range   HIV Screen 4th Generation wRfx Non Reactive Non Reactive    Comment: (NOTE) Performed At: [facility] [address] [place], [place] [id number] Mila Homer MD LL:[id number]   Hemoglobin A1c     Status:  Abnormal   Collection Time: 01/24/17  1:16 AM  Result Value Ref Range   Hgb A1c MFr Bld 7.0 (H) 4.8 - 5.6 %    Comment: (NOTE) Pre diabetes:          5.7%-6.4% Diabetes:              >6.4% Glycemic control for   <7.0% adults with diabetes    Mean Plasma Glucose 154.2 mg/dL  TSH     Status: None   Collection Time: 01/24/17  1:16 AM  Result Value Ref Range   TSH 3.112 0.350 - 4.500 uIU/mL    Comment: Performed by a 3rd Generation assay with a functional sensitivity of <=0.01 uIU/mL.  T4, free     Status: None   Collection Time: 01/24/17  1:16 AM  Result Value Ref Range   Free T4 0.85 0.61 - 1.12 ng/dL    Comment: (NOTE) Biotin ingestion may interfere with free T4 tests. If the results are inconsistent with the TSH level, previous test results, or the clinical presentation, then consider biotin interference. If needed, order repeat testing after stopping biotin.   Glucose, capillary     Status: Abnormal   Collection Time: 01/24/17  6:08 AM  Result Value Ref Range   Glucose-Capillary 151 (H) 65 - 99 mg/dL   Comment 1 Notify RN    Comment 2 Document in Chart   Troponin I     Status: Abnormal   Collection Time: 01/24/17  6:40 AM  Result Value Ref Range   Troponin I 3.02 (HH) <0.03 ng/mL    Comment: CRITICAL RESULT CALLED TO, READ BACK BY AND VERIFIED WITH: J.Carney Corners [id number] 01/24/17 CLARK,S   Lipid panel     Status: None   Collection Time: 01/24/17  6:40 AM  Result Value Ref Range   Cholesterol 162 0 - 200 mg/dL   Triglycerides 130 <150 mg/dL   HDL 43 >40 mg/dL   Total CHOL/HDL Ratio 3.8 RATIO   VLDL 26 0 - 40 mg/dL   LDL Cholesterol 93 0 - 99 mg/dL    Comment:        Total Cholesterol/HDL:CHD Risk Coronary Heart Disease Risk Table                     Men   Women  1/2 Average Risk   3.4   3.3  Average Risk       5.0   4.4  2 X Average Risk   9.6   7.1  3 X Average Risk  23.4   11.0        Use the calculated Patient Ratio above and the CHD Risk Table to determine  the patient's CHD Risk.        ATP III CLASSIFICATION (LDL):  <100     mg/dL   Optimal  100-129  mg/dL   Near or Above                    Optimal  130-159  mg/dL   Borderline  160-189  mg/dL   High  >190     mg/dL   Very High   Hepatic function panel     Status: Abnormal   Collection Time: 01/24/17  6:40 AM  Result Value Ref Range   Total Protein 7.2 6.5 - 8.1 g/dL   Albumin 3.7 3.5 - 5.0 g/dL   AST 33 15 - 41 U/L   ALT 24 17 - 63 U/L  Alkaline Phosphatase 49 38 - 126 U/L   Total Bilirubin 2.2 (H) 0.3 - 1.2 mg/dL   Bilirubin, Direct 0.3 0.1 - 0.5 mg/dL   Indirect Bilirubin 1.9 (H) 0.3 - 0.9 mg/dL  Glucose, capillary     Status: Abnormal   Collection Time: 01/24/17 12:36 PM  Result Value Ref Range   Glucose-Capillary 132 (H) 65 - 99 mg/dL   Comment 1 Notify RN    Comment 2 Document in Chart   Troponin I     Status: Abnormal   Collection Time: 01/24/17 12:48 PM  Result Value Ref Range   Troponin I 2.54 (HH) <0.03 ng/mL    Comment: CRITICAL VALUE NOTED.  VALUE IS CONSISTENT WITH PREVIOUSLY REPORTED AND CALLED VALUE.  Protime-INR     Status: None   Collection Time: 01/24/17 12:48 PM  Result Value Ref Range   Prothrombin Time 13.2 11.4 - 15.2 seconds   INR 1.01     Dg Chest 2 View  Result Date: 01/23/2017 CLINICAL DATA:  56 year old male with abnormal EKG. Epigastric pain, nausea and shortness of breath. EXAM: CHEST  2 VIEW COMPARISON:  None. FINDINGS: Lung volumes are at the upper limits of normal to mildly hyperinflated with some attenuation of upper lobe bronchovascular markings. Cardiac size and mediastinal contours are within normal limits. Visualized tracheal air column is within normal limits. No pneumothorax, pulmonary edema, pleural effusion or confluent pulmonary opacity. Mildly increased interstitial markings in both lungs. Osteopenia. No acute osseous abnormality identified. Negative visible bowel gas pattern. IMPRESSION: 1.  No acute cardiopulmonary abnormality. 2.  Borderline to mild pulmonary hyperinflation with questionable upper lobe emphysema. Electronically Signed   By: Genevie Ann M.D.   On: 01/23/2017 17:11    Review of Systems  Constitutional: Positive for malaise/fatigue. Negative for diaphoresis.  Eyes: Negative for blurred vision and double vision.  Cardiovascular: Positive for chest pain and leg swelling. Negative for claudication.  Gastrointestinal: Positive for heartburn. Negative for nausea and vomiting.  Genitourinary: Negative for dysuria and frequency.  Neurological: Negative for dizziness, focal weakness, loss of consciousness and weakness.  Endo/Heme/Allergies: Positive for polydipsia. Does not bruise/bleed easily.  All other systems reviewed and are negative.  Blood pressure 109/65, pulse 63, temperature 98.6 F (37 C), temperature source Oral, resp. rate 17, height 6' (1.829 m), weight 282 lb 13.6 oz (128.3 kg), SpO2 95 %. Physical Exam  Vitals reviewed. Constitutional: He is oriented to person, place, and time. No distress.  obese  HENT:  Head: Normocephalic and atraumatic.  Mouth/Throat: No oropharyngeal exudate.  Eyes: Conjunctivae and EOM are normal. No scleral icterus.  Neck: Neck supple. No thyromegaly present.  No bruit  Cardiovascular: Normal rate, regular rhythm, normal heart sounds and intact distal pulses.  Exam reveals no gallop and no friction rub.   No murmur heard. Respiratory: Effort normal and breath sounds normal. No respiratory distress. He has no wheezes. He has no rales.  GI: Soft. He exhibits no distension. There is no tenderness.  Musculoskeletal: He exhibits no edema.  Lymphadenopathy:    He has no cervical adenopathy.  Neurological: He is alert and oriented to person, place, and time. No cranial nerve deficit.  Motor intact  Skin: Skin is warm and dry.   ECHOCARDIOGRAM Study Conclusions  - Left ventricle: The cavity size was normal. Systolic function was   normal. The estimated ejection  fraction was in the range of 60%   to 65%. Wall motion was normal; there were no regional wall  motion abnormalities. - Right atrium: The atrium was mildly dilated.  CARDIAC CATHETERIZATION Conclusion     Mid RCA lesion, 80 %stenosed. Proximal vessel is ectatic.  Post Atrio lesion, 100 %stenosed.  Ramus lesion, 95 %stenosed.  Ost LAD to Prox LAD lesion, 90 %stenosed.  The left ventricular systolic function is normal.  LV end diastolic pressure is normal.  The left ventricular ejection fraction is 55-65% by visual estimate.  There is no aortic valve stenosis.   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  Plan for CVTS consult.  Restart heparin 6 hours post sheath pull.    I personally reviewed the cath images and concur with the findings noted above.  Assessment/Plan: 56 yo man with multiple CRF but no prior history of CAD presents late after an out of hospital MI. He was found to have severe 3 vessel CAD with preserved LV function. CABG is indicated for survival benefit and relief of symptoms.  I discussed the general nature of the procedure, the need for general anesthesia, the use of cardiopulmonary bypass, and the incisions to be used with the patient and his children. We discussed the expected hospital stay, overall recovery and short and long term outcomes. I informed them of the indications, risks, benefits and alternatives. They understand the risks include, but are not limited to death, stroke, MI, DVT/PE, bleeding, possible need for transfusion, infections, cardiac arrhythmias, as well as other organ system dysfunction including respiratory, renal, or GI complications.   He accepts the risks and agrees to proceed.  Plan CABG on Monday 9/17  Melrose Nakayama 01/24/2017, 4:01 PM

## 2017-01-24 NOTE — Care Management Note (Signed)
Case Management Note  Patient Details  Name: Joshua Howell MRN: [id number] Date of Birth: 08/16/1960  Subjective/Objective:  From home with son who is 56 years old he is there in the evening, he also has a daughter who can assist in the day time if needed.  s/p left heart cath/coronary angiography, hs severe 3 vessel CAD, for CVTS consult.                 Action/Plan: NCM will follow for dc needs.   Expected Discharge Date:                  Expected Discharge Plan:  Home/Self Care  In-House Referral:     Discharge planning Services  CM Consult  Post Acute Care Choice:    Choice offered to:     DME Arranged:    DME Agency:     HH Arranged:    HH Agency:     Status of Service:  In process, will continue to follow  If discussed at Long Length of Stay Meetings, dates discussed:    Additional Comments:  Leone Havenaylor, Yovanni Frenette Clinton, RN 01/24/2017, 10:11 AM

## 2017-01-24 NOTE — Interval H&P Note (Signed)
Cath Lab Visit (complete for each Cath Lab visit)  Clinical Evaluation Leading to the Procedure:   ACS: Yes.    Non-ACS:    Anginal Classification: CCS IV  Anti-ischemic medical therapy: Minimal Therapy (1 class of medications)  Non-Invasive Test Results: No non-invasive testing performed  Prior CABG: No previous CABG      History and Physical Interval Note:  01/24/2017 8:50 AM  Joshua LeechFredrick F Offutt  has presented today for surgery, with the diagnosis of NSTEMI  The various methods of treatment have been discussed with the patient and family. After consideration of risks, benefits and other options for treatment, the patient has consented to  Procedure(s): LEFT HEART CATH AND CORONARY ANGIOGRAPHY (N/A) as a surgical intervention .  The patient's history has been reviewed, patient examined, no change in status, stable for surgery.  I have reviewed the patient's chart and labs.  Questions were answered to the patient's satisfaction.     Lance MussJayadeep Alphonsa Brickle

## 2017-01-24 NOTE — Progress Notes (Signed)
ANTICOAGULATION CONSULT NOTE - FOLLOW UP    HL = 0.12 (goal 0.3 - 0.7 units/mL) Heparin dosing weight = 99 kg   Assessment: 56 YOM presented with abnormal EKG and chest pain.  He continues on IV heparin and heparin level is sub-therapeutic.  No issue with heparin infusion nor bleeding per RN.   Plan: Rebolus with 2500 units IV x 1, then Increase heparin gtt to 1550 units/hr Check 6 hr heparin level   Joshua Howell, PharmD, BCPS 01/24/2017, 2:30 AM

## 2017-01-25 ENCOUNTER — Inpatient Hospital Stay (HOSPITAL_COMMUNITY): Payer: BLUE CROSS/BLUE SHIELD

## 2017-01-25 DIAGNOSIS — Z01810 Encounter for preprocedural cardiovascular examination: Secondary | ICD-10-CM

## 2017-01-25 LAB — CBC
HEMATOCRIT: 46.2 % (ref 39.0–52.0)
Hemoglobin: 15.6 g/dL (ref 13.0–17.0)
MCH: 32.4 pg (ref 26.0–34.0)
MCHC: 33.8 g/dL (ref 30.0–36.0)
MCV: 95.9 fL (ref 78.0–100.0)
Platelets: 170 10*3/uL (ref 150–400)
RBC: 4.82 MIL/uL (ref 4.22–5.81)
RDW: 13 % (ref 11.5–15.5)
WBC: 7.2 10*3/uL (ref 4.0–10.5)

## 2017-01-25 LAB — TYPE AND SCREEN
ABO/RH(D): A POS
Antibody Screen: NEGATIVE

## 2017-01-25 LAB — GLUCOSE, CAPILLARY
GLUCOSE-CAPILLARY: 129 mg/dL — AB (ref 65–99)
GLUCOSE-CAPILLARY: 126 mg/dL — AB (ref 65–99)
Glucose-Capillary: 128 mg/dL — ABNORMAL HIGH (ref 65–99)
Glucose-Capillary: 114 mg/dL — ABNORMAL HIGH (ref 65–99)

## 2017-01-25 LAB — PULMONARY FUNCTION TEST
DL/VA % PRED: 106 %
DL/VA: 5.04 ml/min/mmHg/L
DLCO COR % PRED: 95 %
DLCO COR: 33.33 ml/min/mmHg
DLCO UNC % PRED: 97 %
DLCO unc: 34.23 ml/min/mmHg
FEF 25-75 POST: 4.21 L/s
FEF 25-75 PRE: 2.02 L/s
FEF2575-%CHANGE-POST: 108 %
FEF2575-%PRED-PRE: 62 %
FEF2575-%Pred-Post: 129 %
FEV1-%Change-Post: 23 %
FEV1-%PRED-PRE: 73 %
FEV1-%Pred-Post: 89 %
FEV1-POST: 3.52 L
FEV1-Pre: 2.86 L
FEV1FVC-%CHANGE-POST: 3 %
FEV1FVC-%PRED-PRE: 97 %
FEV6-%CHANGE-POST: 18 %
FEV6-%PRED-PRE: 77 %
FEV6-%Pred-Post: 91 %
FEV6-Post: 4.51 L
FEV6-Pre: 3.81 L
FEV6FVC-%Change-Post: 0 %
FEV6FVC-%Pred-Post: 101 %
FEV6FVC-%Pred-Pre: 102 %
FVC-%Change-Post: 18 %
FVC-%Pred-Post: 89 %
FVC-%Pred-Pre: 75 %
FVC-PRE: 3.88 L
FVC-Post: 4.61 L
POST FEV1/FVC RATIO: 76 %
PRE FEV1/FVC RATIO: 74 %
Post FEV6/FVC ratio: 98 %
Pre FEV6/FVC Ratio: 98 %
RV % pred: 151 %
RV: 3.51 L
TLC % pred: 102 %
TLC: 7.54 L

## 2017-01-25 LAB — HEPARIN LEVEL (UNFRACTIONATED)
Heparin Unfractionated: 0.38 IU/mL (ref 0.30–0.70)
Heparin Unfractionated: 0.34 IU/mL (ref 0.30–0.70)

## 2017-01-25 LAB — ABO/RH: ABO/RH(D): A POS

## 2017-01-25 MED ORDER — TEMAZEPAM 7.5 MG PO CAPS: 15.0000 mg | ORAL_CAPSULE | Freq: Once | ORAL | Status: DC | PRN

## 2017-01-25 MED ORDER — METOPROLOL TARTRATE 12.5 MG HALF TABLET
12.5000 mg | ORAL_TABLET | Freq: Once | ORAL | Status: DC
  Filled 2017-01-25: qty 1

## 2017-01-25 MED ORDER — DIAZEPAM 5 MG PO TABS
5.0000 mg | ORAL_TABLET | Freq: Once | ORAL | Status: AC
  Administered 2017-01-28: 5 mg via ORAL
  Filled 2017-01-25: qty 1

## 2017-01-25 MED ORDER — ALBUTEROL SULFATE (2.5 MG/3ML) 0.083% IN NEBU
2.5000 mg | INHALATION_SOLUTION | Freq: Once | RESPIRATORY_TRACT | Status: AC
  Administered 2017-01-25: 13:00:00 2.5 mg via RESPIRATORY_TRACT

## 2017-01-25 MED ORDER — CHLORHEXIDINE GLUCONATE 0.12 % MT SOLN
15.0000 mL | Freq: Once | OROMUCOSAL | Status: AC
  Administered 2017-01-28: 15 mL via OROMUCOSAL
  Filled 2017-01-25: qty 15

## 2017-01-25 MED ORDER — BISACODYL 5 MG PO TBEC
5.0000 mg | DELAYED_RELEASE_TABLET | Freq: Once | ORAL | Status: DC
  Filled 2017-01-25: qty 1

## 2017-01-25 MED ORDER — CHLORHEXIDINE GLUCONATE CLOTH 2 % EX PADS: 6.0000 | MEDICATED_PAD | Freq: Once | CUTANEOUS | Status: DC

## 2017-01-25 NOTE — Care Management Note (Addendum)
Case Management Note  Patient Details  Name: Joshua Howell MRN: [id number] Date of Birth: June 29, 1960  Subjective/Objective:    From home with son who is 56 years old he is there in the evening, he also has a daughter who can assist in the day time if needed.  s/p left heart cath/coronary angiography, hs severe 3 vessel CAD, for CVTS consult.  9/14 0901 [name] RN, BSN - Per CVTS  Plan for CABG on Monday 9/17, conts on heparin drip.     9/17 1455 [name] RN, BSN - post op CABG,  On vent,  conts on iv abx, precedex, dopamine, neo.    9/18 1752 [name] RN, BSN - wean dopamine and neo drips, , dc chest tubes.            Action/Plan: NCM will follow for dc needs.   Expected Discharge Date:                  Expected Discharge Plan:  Home/Self Care  In-House Referral:     Discharge planning Services  CM Consult  Post Acute Care Choice:    Choice offered to:     DME Arranged:    DME Agency:     HH Arranged:    HH Agency:     Status of Service:  In process, will continue to follow  If discussed at Long Length of Stay Meetings, dates discussed:    Additional Comments:  [name], RN 01/25/2017, 9:01 AM

## 2017-01-25 NOTE — Progress Notes (Signed)
ANTICOAGULATION CONSULT NOTE  Pharmacy Consult for heparin Indication: chest pain/ACS  No Known Allergies  Patient Measurements: Height: 6' (182.9 cm) Weight: 284 lb 6.3 oz (129 kg) IBW/kg (Calculated) : 77.6 Heparin Dosing Weight: 99 Kg  Vital Signs: Temp: 98.3 F (36.8 C) (09/14 1124) Temp Source: Oral (09/14 1124) BP: 116/62 (09/14 1124) Pulse Rate: 62 (09/14 1124)  Labs:  Recent Labs  01/23/17 1651  01/24/17 0116 01/24/17 0640 01/24/17 1248 01/24/17 1902 01/24/17 2109 01/25/17 0445 01/25/17 1211  HGB 16.6  --  15.4  --   --   --   --  15.6  --   HCT 49.0  --  45.6  --   --   --   --  46.2  --   PLT 188  --  164  --   --   --   --  170  --   LABPROT  --   --   --   --  13.2  --   --   --   --   INR  --   --   --   --  1.01  --   --   --   --   HEPARINUNFRC  --   < > 0.12*  --   --   --  0.14* 0.38 0.34  CREATININE 1.21  --   --   --   --   --   --   --   --   TROPONINI  --   --   --  3.02* 2.54* 3.26*  --   --   --   < > = values in this interval not displayed.  Estimated Creatinine Clearance: 92.4 mL/min (by C-G formula based on SCr of 1.21 mg/dL).   Medical History: Past Medical History:  Diagnosis Date  . Elevated blood-pressure reading without diagnosis of hypertension   . Hepatitis B   . Impaired fasting blood sugar   . Migraine    "only in [employer]" (01/23/2017)  . Morbid obesity (HCC)   . NSTEMI (non-ST elevated myocardial infarction) (HCC) 01/23/2017  . Type I diabetes mellitus (HCC) 07/2016   Assessment:  56 yoM sent from urgent care with abnormal EKG and chest pain earlier this week. No anticoagulation PTA. He is noted with NSTEMI s/p cath with 3VCAD for CABG on 9/17 -heparin level is at goal  Goal of Therapy:  Heparin level 0.3-0.7 units/ml Monitor platelets by anticoagulation protocol: Yes   Plan:  -No heparin changes needed -Daily heparin level and CBC  Harland German, Pharm D 01/25/2017 1:46 PM

## 2017-01-25 NOTE — Progress Notes (Signed)
CARDIAC REHAB PHASE I  MD to advise if pt appropriate for ambulation while awaiting surgery. Cardiac surgery pre-op education completed with pt and family at bedside. Reviewed IS, sternal precautions, cardiac surgery booklet and cardiac surgery guidelines. Left instructions to view cardiac surgery videos. Pt verbalized understanding. Pt in bed, call bell within reach. Will follow.   [id number] Joylene Grapes, RN, BSN 01/25/2017 9:42 AM

## 2017-01-25 NOTE — Progress Notes (Signed)
Progress Note  Patient Name: Joshua Howell Date of Encounter: 01/25/2017  Primary Cardiologist: Dr. Delton See  Subjective   No indigestion, no SOB no other complaints  Inpatient Medications    Scheduled Meds: . aspirin EC  81 mg Oral Daily  . atorvastatin  80 mg Oral q1800  . insulin aspart  0-15 Units Subcutaneous TID WC  . insulin aspart  0-5 Units Subcutaneous QHS  . metoprolol tartrate  12.5 mg Oral BID  . sodium chloride flush  3 mL Intravenous Q12H   Continuous Infusions: . sodium chloride    . heparin 1,800 Units/hr (01/25/17 0835)   PRN Meds: sodium chloride, acetaminophen, acetaminophen, nitroGLYCERIN, ondansetron (ZOFRAN) IV, sodium chloride flush   Vital Signs    Vitals:   01/24/17 2000 01/24/17 2100 01/25/17 0445 01/25/17 0740  BP: (!) 127/57 121/67 (!) 95/54 111/77  Pulse:    65  Resp: (!) 27 (!) 23 18   Temp:   98.2 F (36.8 C) (!) 97.5 F (36.4 C)  TempSrc:   Oral Oral  SpO2: 96% 95% 95% 100%  Weight:   284 lb 6.3 oz (129 kg)   Height:        Intake/Output Summary (Last 24 hours) at 01/25/17 1044 Last data filed at 01/25/17 0700  Gross per 24 hour  Intake          1073.13 ml  Output             2300 ml  Net         -1226.87 ml   Filed Weights   01/23/17 2237 01/24/17 0400 01/25/17 0445  Weight: 282 lb 13.6 oz (128.3 kg) 282 lb 13.6 oz (128.3 kg) 284 lb 6.3 oz (129 kg)    Telemetry    SR - Personally Reviewed  ECG    SB at 59, no acute changes  - Personally Reviewed  Physical Exam   GEN: No acute distress.   Neck: No JVD Cardiac: RRR, no murmurs, rubs, or gallops. Rt wrist without hematoma  Respiratory: Clear to auscultation bilaterally. GI: obese,Soft, nontender, non-distended  MS: No edema; No deformity. Neuro:  Nonfocal  Psych: Normal affect   Labs    Chemistry Recent Labs Lab 01/23/17 1651 01/24/17 0640  NA 135  --   K 4.4  --   CL 102  --   CO2 25  --   GLUCOSE 172*  --   BUN 15  --   CREATININE 1.21  --     CALCIUM 9.3  --   PROT  --  7.2  ALBUMIN  --  3.7  AST  --  33  ALT  --  24  ALKPHOS  --  49  BILITOT  --  2.2*  GFRNONAA >60  --   GFRAA >60  --   ANIONGAP 8  --      Hematology Recent Labs Lab 01/23/17 1651 01/24/17 0116 01/25/17 0445  WBC 8.6 8.2 7.2  RBC 5.12 4.77 4.82  HGB 16.6 15.4 15.6  HCT 49.0 45.6 46.2  MCV 95.7 95.6 95.9  MCH 32.4 32.3 32.4  MCHC 33.9 33.8 33.8  RDW 12.9 12.8 13.0  PLT 188 164 170    Cardiac Enzymes Recent Labs Lab 01/24/17 0640 01/24/17 1248 01/24/17 1902  TROPONINI 3.02* 2.54* 3.26*    Recent Labs Lab 01/23/17 1657  TROPIPOC 1.61*     BNPNo results for input(s): BNP, PROBNP in the last 168 hours.   DDimer No results  for input(s): DDIMER in the last 168 hours.   Radiology    Dg Chest 2 View  Result Date: 01/23/2017 CLINICAL DATA:  56 year old male with abnormal EKG. Epigastric pain, nausea and shortness of breath. EXAM: CHEST  2 VIEW COMPARISON:  None. FINDINGS: Lung volumes are at the upper limits of normal to mildly hyperinflated with some attenuation of upper lobe bronchovascular markings. Cardiac size and mediastinal contours are within normal limits. Visualized tracheal air column is within normal limits. No pneumothorax, pulmonary edema, pleural effusion or confluent pulmonary opacity. Mildly increased interstitial markings in both lungs. Osteopenia. No acute osseous abnormality identified. Negative visible bowel gas pattern. IMPRESSION: 1.  No acute cardiopulmonary abnormality. 2. Borderline to mild pulmonary hyperinflation with questionable upper lobe emphysema. Electronically Signed   By: Odessa Fleming M.D.   On: 01/23/2017 17:11    Cardiac Studies   Procedures  01/24/17  LEFT HEART CATH AND CORONARY ANGIOGRAPHY  Conclusion     Mid RCA lesion, 80 %stenosed. Proximal vessel is ectatic.  Post Atrio lesion, 100 %stenosed.  Ramus lesion, 95 %stenosed.  Ost LAD to Prox LAD lesion, 90 %stenosed.  The left ventricular  systolic function is normal.  LV end diastolic pressure is normal.  The left ventricular ejection fraction is 55-65% by visual estimate.  There is no aortic valve stenosis.   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  Plan for CVTS consult.  Restart heparin 6 hours post sheath pull.     ECHO 01/24/17 Study Conclusions  - Left ventricle: The cavity size was normal. Systolic function was   normal. The estimated ejection fraction was in the range of 60%   to 65%. Wall motion was normal; there were no regional wall   motion abnormalities. - Right atrium: The atrium was mildly dilated.   Patient Profile     56 y.o. male w/ hx DM, ?HTN, morbid obesity was admitted 09/12 w/ NSTEMI, initial trop 1.61   Assessment & Plan    NSTEMI (non-ST elevated myocardial infarction) (HCC) -out of hospital MI -Troponin pk 3.26 - no more CP, cath done - no chest pain today -on IV heparin  Transfer to tele  ? Ambulate in hall  CAD significant disease back on heparin. --Dr. Dorris Fetch has seen  Pt agreeable to CABG and planned for Monday     Diabetes mellitus with complication (HCC) - encourage diet compliance, hold metformin. - add SSI - glucose 114 to 128 stable    Morbid obesity (HCC) - encourage healthier diet    Mixed dyslipidemia - lipid profile  With LDL 93, TG 130, T chol 162 , high-dose statin started  For questions or updates, please contact [facility] Please consult [email or web address] for contact info under Cardiology/STEMI.      Signed, Nada Boozer, NP  01/25/2017, 10:44 AM    The patient was seen, examined and discussed with Nada Boozer, NP and I agree with the above.   Asymptomatic, on ASA, atorvastatin, metoprolol, back on heparin drip as his troponin is still going up, BP controlled, awaiting CABG on Monday, we will transfer to telemetry.   Tobias Alexander, MD 01/25/2017

## 2017-01-25 NOTE — Progress Notes (Signed)
1 Day Post-Op Procedure(s) (LRB): LEFT HEART CATH AND CORONARY ANGIOGRAPHY (N/A) Subjective: No complaints  Objective: Vital signs in last 24 hours: Temp:  [97.5 F (36.4 C)-98.3 F (36.8 C)] 98.3 F (36.8 C) (09/14 1124) Pulse Rate:  [59-65] 62 (09/14 1124) Cardiac Rhythm: Normal sinus rhythm;Sinus bradycardia (09/14 0800) Resp:  [15-27] 18 (09/14 0445) BP: (95-127)/(54-77) 116/62 (09/14 1124) SpO2:  [95 %-100 %] 100 % (09/14 1124) Weight:  [284 lb 6.3 oz (129 kg)] 284 lb 6.3 oz (129 kg) (09/14 0445)  Hemodynamic parameters for last 24 hours:    Intake/Output from previous day: 09/13 0701 - 09/14 0700 In: 1073.1 [P.O.:810; I.V.:263.1] Out: 2300 [Urine:2300] Intake/Output this shift: Total I/O In: -  Out: 900 [Urine:900]  General appearance: alert, cooperative and no distress Heart: regular rate and rhythm Lungs: clear to auscultation bilaterally  Lab Results:  Recent Labs  01/24/17 0116 01/25/17 0445  WBC 8.2 7.2  HGB 15.4 15.6  HCT 45.6 46.2  PLT 164 170   BMET:  Recent Labs  01/23/17 1651  NA 135  K 4.4  CL 102  CO2 25  GLUCOSE 172*  BUN 15  CREATININE 1.21  CALCIUM 9.3    PT/INR:  Recent Labs  01/24/17 1248  LABPROT 13.2  INR 1.01   ABG No results found for: PHART, HCO3, TCO2, ACIDBASEDEF, O2SAT CBG (last 3)   Recent Labs  01/24/17 2115 01/25/17 0629 01/25/17 1128  GLUCAP 121* 128* 129*    Assessment/Plan: S/P Procedure(s) (LRB): LEFT HEART CATH AND CORONARY ANGIOGRAPHY (N/A) -PFTs OK Vascular labs done but report not in chart yet For CABG on Monday, all questions answered   LOS: 2 days    [name] 01/25/2017

## 2017-01-25 NOTE — Progress Notes (Signed)
Pre-op Cardiac Surgery  Carotid Findings:   Findings are consistent with a 1-39 percent stenosis involving the right internal carotid artery and the left internal carotid artery. The vertebral arteries demonstrate antegrade flow.  Upper Extremity Right Left  Brachial Pressures 126  Triphasic 137  Triphasic  Radial Waveforms Triphasic Triphasic  Ulnar Waveforms Triphasic Triphasic  Palmar Arch (Allen's Test) Palmar waveforms are obliterated with radial and ulnar compression. Palmar waveforms are obliterated with radial and ulnar compression.    Lower  Extremity Right Left  Dorsalis Pedis 147 138  Posterior Tibial 141 136  Ankle/Brachial Indices 1.07 1.01   Findings:   Right ABI of 1.07 and left ABI of 1.01 are suggestive of arterial flow within normal limits at rest.  01/25/17 4:10 PM Olen Cordial RVT

## 2017-01-25 NOTE — Progress Notes (Signed)
ANTICOAGULATION CONSULT NOTE - FOLLOW UP    HL = 0.38 (goal 0.3 - 0.7 units/mL) Heparin dosing weight = 99 kg   Assessment: 56 YOM s/p cath to continue on IV heparin while awaiting CABG.  Heparin level is therapeutic; no bleeding reported.   Plan: Continue heparin gtt at 1800 units/hr Daily heparin level and CBC   Yazlin Ekblad D. Laney Potash, PharmD, BCPS 01/25/2017, 5:29 AM

## 2017-01-26 DIAGNOSIS — I25110 Atherosclerotic heart disease of native coronary artery with unstable angina pectoris: Secondary | ICD-10-CM

## 2017-01-26 LAB — COMPREHENSIVE METABOLIC PANEL
ALT: 22 U/L (ref 17–63)
AST: 23 U/L (ref 15–41)
Albumin: 3.5 g/dL (ref 3.5–5.0)
Alkaline Phosphatase: 46 U/L (ref 38–126)
Anion gap: 8 (ref 5–15)
BUN: 18 mg/dL (ref 6–20)
CHLORIDE: 104 mmol/L (ref 101–111)
CO2: 23 mmol/L (ref 22–32)
Calcium: 8.7 mg/dL — ABNORMAL LOW (ref 8.9–10.3)
Creatinine, Ser: 1.16 mg/dL (ref 0.61–1.24)
GFR calc non Af Amer: >60 mL/min (ref >60)
Glucose, Bld: 120 mg/dL — ABNORMAL HIGH (ref 65–99)
POTASSIUM: 4.2 mmol/L (ref 3.5–5.1)
Sodium: 135 mmol/L (ref 135–145)
TOTAL PROTEIN: 6.7 g/dL (ref 6.5–8.1)
Total Bilirubin: 1.6 mg/dL — ABNORMAL HIGH (ref 0.3–1.2)

## 2017-01-26 LAB — VAS US DOPPLER PRE CABG
LCCADDIAS: -13 cm/s
LCCADSYS: -54 cm/s
LCCAPDIAS: 12 cm/s
LEFT ECA DIAS: -9 cm/s
LEFT VERTEBRAL DIAS: -11 cm/s
LICADDIAS: -21 cm/s
LICAPDIAS: -14 cm/s
LICAPSYS: -40 cm/s
Left CCA prox sys: 66 cm/s
Left ICA dist sys: -47 cm/s
RCCAPSYS: -79 cm/s
RIGHT ECA DIAS: -9 cm/s
RIGHT VERTEBRAL DIAS: -10 cm/s
Right CCA prox dias: -12 cm/s
Right cca dist sys: -39 cm/s

## 2017-01-26 LAB — CBC
HEMATOCRIT: 44.9 % (ref 39.0–52.0)
HEMOGLOBIN: 15.1 g/dL (ref 13.0–17.0)
MCH: 32.1 pg (ref 26.0–34.0)
MCHC: 33.6 g/dL (ref 30.0–36.0)
MCV: 95.3 fL (ref 78.0–100.0)
PLATELETS: 154 10*3/uL (ref 150–400)
RBC: 4.71 MIL/uL (ref 4.22–5.81)
RDW: 12.9 % (ref 11.5–15.5)
WBC: 6.2 10*3/uL (ref 4.0–10.5)

## 2017-01-26 LAB — GLUCOSE, CAPILLARY
GLUCOSE-CAPILLARY: 132 mg/dL — AB (ref 65–99)
GLUCOSE-CAPILLARY: 130 mg/dL — AB (ref 65–99)
Glucose-Capillary: 125 mg/dL — ABNORMAL HIGH (ref 65–99)
Glucose-Capillary: 126 mg/dL — ABNORMAL HIGH (ref 65–99)

## 2017-01-26 LAB — BLOOD GAS, ARTERIAL
ACID-BASE DEFICIT: 1.1 mmol/L (ref 0.0–2.0)
Bicarbonate: 23.2 mmol/L (ref 20.0–28.0)
DRAWN BY: 28340
FIO2: 21
O2 Saturation: 95.5 %
PATIENT TEMPERATURE: 98.6
PH ART: 7.383 (ref 7.350–7.450)
pCO2 arterial: 39.9 mmHg (ref 32.0–48.0)
pO2, Arterial: 82.4 mmHg — ABNORMAL LOW (ref 83.0–108.0)

## 2017-01-26 LAB — APTT: aPTT: 66 seconds — ABNORMAL HIGH (ref 24–36)

## 2017-01-26 LAB — URINALYSIS, COMPLETE (UACMP) WITH MICROSCOPIC
BACTERIA UA: NONE SEEN
Bilirubin Urine: NEGATIVE
Glucose, UA: NEGATIVE mg/dL
Hgb urine dipstick: NEGATIVE
Ketones, ur: NEGATIVE mg/dL
Leukocytes, UA: NEGATIVE
Nitrite: NEGATIVE
PH: 5 (ref 5.0–8.0)
Protein, ur: NEGATIVE mg/dL
SPECIFIC GRAVITY, URINE: 1.009 (ref 1.005–1.030)
SQUAMOUS EPITHELIAL / LPF: NONE SEEN

## 2017-01-26 LAB — SURGICAL PCR SCREEN
MRSA, PCR: NEGATIVE
Staphylococcus aureus: NEGATIVE

## 2017-01-26 LAB — HEPARIN LEVEL (UNFRACTIONATED): HEPARIN UNFRACTIONATED: 0.37 [IU]/mL (ref 0.30–0.70)

## 2017-01-26 MED ORDER — CHLORHEXIDINE GLUCONATE CLOTH 2 % EX PADS
6.0000 | MEDICATED_PAD | Freq: Once | CUTANEOUS | Status: AC
  Administered 2017-01-28: 6 via TOPICAL

## 2017-01-26 MED ORDER — CHLORHEXIDINE GLUCONATE CLOTH 2 % EX PADS
6.0000 | MEDICATED_PAD | Freq: Once | CUTANEOUS | Status: AC
  Administered 2017-01-27: 6 via TOPICAL

## 2017-01-26 NOTE — Progress Notes (Signed)
Progress Note  Patient Name: Joshua Howell Date of Encounter: 01/26/2017  Primary Cardiologist: Delton See  Subjective   No angina walking in the hall.  Inpatient Medications    Scheduled Meds: . aspirin EC  81 mg Oral Daily  . atorvastatin  80 mg Oral q1800  . [START ON 01/27/2017] bisacodyl  5 mg Oral Once  . [START ON 01/28/2017] chlorhexidine  15 mL Mouth/Throat Once  . [START ON 01/27/2017] Chlorhexidine Gluconate Cloth  6 each Topical Once   And  . [START ON 01/28/2017] Chlorhexidine Gluconate Cloth  6 each Topical Once  . [START ON 01/28/2017] diazepam  5 mg Oral Once  . insulin aspart  0-15 Units Subcutaneous TID WC  . insulin aspart  0-5 Units Subcutaneous QHS  . metoprolol tartrate  12.5 mg Oral BID  . [START ON 01/28/2017] metoprolol tartrate  12.5 mg Oral Once  . sodium chloride flush  3 mL Intravenous Q12H   Continuous Infusions: . sodium chloride    . heparin 1,800 Units/hr (01/26/17 0018)   PRN Meds: sodium chloride, acetaminophen, acetaminophen, nitroGLYCERIN, ondansetron (ZOFRAN) IV, sodium chloride flush, [START ON 01/27/2017] temazepam   Vital Signs    Vitals:   01/25/17 1934 01/25/17 2154 01/26/17 0418 01/26/17 0700  BP: (!) 102/59 (!) 117/57 102/68 (!) 99/47  Pulse: 67 63 60 61  Resp: (!) 21  15   Temp: (!) 97.4 F (36.3 C)  97.6 F (36.4 C) 97.7 F (36.5 C)  TempSrc: Oral  Oral Oral  SpO2: 95%  96% 96%  Weight:   271 lb 9.6 oz (123.2 kg)   Height:        Intake/Output Summary (Last 24 hours) at 01/26/17 0847 Last data filed at 01/26/17 0751  Gross per 24 hour  Intake              363 ml  Output             2600 ml  Net            -2237 ml   Filed Weights   01/24/17 0400 01/25/17 0445 01/26/17 0418  Weight: 282 lb 13.6 oz (128.3 kg) 284 lb 6.3 oz (129 kg) 271 lb 9.6 oz (123.2 kg)    Telemetry    NSR - Personally Reviewed  ECG    NSR, NSIVCD, RAD, QRS 116 ms - Personally Reviewed  Physical Exam  Obese GEN: No acute distress.     Neck: No JVD Cardiac: RRR, no murmurs, rubs, or gallops. Healthy radial cath site. Respiratory: Clear to auscultation bilaterally. GI: Soft, nontender, non-distended  MS: No edema; No deformity. Neuro:  Nonfocal  Psych: Normal affect   Labs    Chemistry Recent Labs Lab 01/23/17 1651 01/24/17 0640 01/26/17 0449  NA 135  --  135  K 4.4  --  4.2  CL 102  --  104  CO2 25  --  23  GLUCOSE 172*  --  120*  BUN 15  --  18  CREATININE 1.21  --  1.16  CALCIUM 9.3  --  8.7*  PROT  --  7.2 6.7  ALBUMIN  --  3.7 3.5  AST  --  33 23  ALT  --  24 22  ALKPHOS  --  49 46  BILITOT  --  2.2* 1.6*  GFRNONAA >60  --  >60  GFRAA >60  --  >60  ANIONGAP 8  --  8     Hematology Recent  Labs Lab 01/24/17 0116 01/25/17 0445 01/26/17 0449  WBC 8.2 7.2 6.2  RBC 4.77 4.82 4.71  HGB 15.4 15.6 15.1  HCT 45.6 46.2 44.9  MCV 95.6 95.9 95.3  MCH 32.3 32.4 32.1  MCHC 33.8 33.8 33.6  RDW 12.8 13.0 12.9  PLT 164 170 154    Cardiac Enzymes Recent Labs Lab 01/24/17 0640 01/24/17 1248 01/24/17 1902  TROPONINI 3.02* 2.54* 3.26*    Recent Labs Lab 01/23/17 1657  TROPIPOC 1.61*     BNPNo results for input(s): BNP, PROBNP in the last 168 hours.   DDimer No results for input(s): DDIMER in the last 168 hours.   Radiology    No results found.  Cardiac Studies   Diagnostic Diagram        Patient Profile     56 y.o. male with new onset angina, small NSTEMI, found to have severe 2-vessel CAD, scheduled for CABG Monday.  Assessment & Plan    1. CAD/NSTEMI: keep on IV heparin until CABG. 2. DM w mixed hyperlipidemia: borderline control, improving trend; A1c 7% 3. Morbid obesity: increases surgical risk  For questions or updates, please contact [facility] Please consult [email or web address] for contact info under Cardiology/STEMI.      Signed, Thurmon Fair, MD  01/26/2017, 8:47 AM

## 2017-01-26 NOTE — Progress Notes (Signed)
ANTICOAGULATION CONSULT NOTE  Pharmacy Consult for Heparin Indication: chest pain/ACS  No Known Allergies  Patient Measurements: Height: 6' (182.9 cm) Weight: 271 lb 9.6 oz (123.2 kg) IBW/kg (Calculated) : 77.6 Heparin Dosing Weight: 99 Kg  Vital Signs: Temp: 97.7 F (36.5 C) (09/15 0700) Temp Source: Oral (09/15 0700) BP: 99/47 (09/15 0700) Pulse Rate: 61 (09/15 0700)  Labs:  Recent Labs  01/23/17 1651 01/24/17 0116 01/24/17 0640 01/24/17 1248 01/24/17 1902  01/25/17 0445 01/25/17 1211 01/26/17 0449  HGB 16.6 15.4  --   --   --   --  15.6  --  15.1  HCT 49.0 45.6  --   --   --   --  46.2  --  44.9  PLT 188 164  --   --   --   --  170  --  154  APTT  --   --   --   --   --   --   --   --  66*  LABPROT  --   --   --  13.2  --   --   --   --   --   INR  --   --   --  1.01  --   --   --   --   --   HEPARINUNFRC  --  0.12*  --   --   --   < > 0.38 0.34 0.37  CREATININE 1.21  --   --   --   --   --   --   --  1.16  TROPONINI  --   --  3.02* 2.54* 3.26*  --   --   --   --   < > = values in this interval not displayed.  Estimated Creatinine Clearance: 94.1 mL/min (by C-G formula based on SCr of 1.16 mg/dL).  Assessment:  56 yoM sent from urgent care with abnormal EKG and chest pain earlier this week. No anticoagulation PTA. He is noted with NSTEMI s/p cath with 3VCAD for CABG on 9/17.  -Heparin level is at goal: 0.37 -CBC stable  Goal of Therapy:  Heparin level 0.3-0.7 units/ml Monitor platelets by anticoagulation protocol: Yes   Plan:  -Continue heparin at 1,800 units/hr -Daily heparin level and CBC -Per Dr. Dorris Fetch: heparin stop time 9/17 at 4am   Diana L. Marcy Salvo, PharmD, MS PGY1 Pharmacy Resident Pager: [phone]

## 2017-01-26 NOTE — Progress Notes (Signed)
Pt arrived to 4e from 6c. Pt oriented to room and staff. Telemetry monitor applied and CCMD notified. Vitals obtained. Pt up in chair with call light within reach. Pt denies needs at this time. Will continue current plan of care.   Berdine Dance BSN, RN

## 2017-01-27 LAB — CBC
HCT: 46.9 % (ref 39.0–52.0)
Hemoglobin: 15.9 g/dL (ref 13.0–17.0)
MCH: 32.3 pg (ref 26.0–34.0)
MCHC: 33.9 g/dL (ref 30.0–36.0)
MCV: 95.3 fL (ref 78.0–100.0)
PLATELETS: 173 10*3/uL (ref 150–400)
RBC: 4.92 MIL/uL (ref 4.22–5.81)
RDW: 13.1 % (ref 11.5–15.5)
WBC: 7.1 10*3/uL (ref 4.0–10.5)

## 2017-01-27 LAB — GLUCOSE, CAPILLARY
GLUCOSE-CAPILLARY: 137 mg/dL — AB (ref 65–99)
Glucose-Capillary: 129 mg/dL — ABNORMAL HIGH (ref 65–99)

## 2017-01-27 LAB — HEPARIN LEVEL (UNFRACTIONATED): HEPARIN UNFRACTIONATED: 0.43 [IU]/mL (ref 0.30–0.70)

## 2017-01-27 MED ORDER — TRANEXAMIC ACID (OHS) BOLUS VIA INFUSION
15.0000 mg/kg | INTRAVENOUS | Status: AC
  Administered 2017-01-28: 1848 mg via INTRAVENOUS
  Filled 2017-01-27: qty 1848

## 2017-01-27 MED ORDER — SODIUM CHLORIDE 0.9 % IV SOLN
30.0000 ug/min | INTRAVENOUS | Status: AC
  Administered 2017-01-28: 10 ug/min via INTRAVENOUS
  Filled 2017-01-27: qty 2

## 2017-01-27 MED ORDER — DEXMEDETOMIDINE HCL IN NACL 400 MCG/100ML IV SOLN
0.1000 ug/kg/h | INTRAVENOUS | Status: AC
  Administered 2017-01-28: .3 ug/kg/h via INTRAVENOUS
  Filled 2017-01-27: qty 100

## 2017-01-27 MED ORDER — MAGNESIUM SULFATE 50 % IJ SOLN
40.0000 meq | INTRAMUSCULAR | Status: DC
  Filled 2017-01-27: qty 10

## 2017-01-27 MED ORDER — VANCOMYCIN HCL 10 G IV SOLR
1500.0000 mg | INTRAVENOUS | Status: AC
  Administered 2017-01-28: 1500 mg via INTRAVENOUS
  Filled 2017-01-27: qty 1500

## 2017-01-27 MED ORDER — TRANEXAMIC ACID (OHS) PUMP PRIME SOLUTION
2.0000 mg/kg | INTRAVENOUS | Status: DC
  Filled 2017-01-27: qty 2.46

## 2017-01-27 MED ORDER — PLASMA-LYTE 148 IV SOLN
INTRAVENOUS | Status: AC
  Administered 2017-01-28: 500 mL
  Filled 2017-01-27: qty 2.5

## 2017-01-27 MED ORDER — DOPAMINE-DEXTROSE 3.2-5 MG/ML-% IV SOLN
0.0000 ug/kg/min | INTRAVENOUS | Status: DC
  Filled 2017-01-27: qty 250

## 2017-01-27 MED ORDER — EPINEPHRINE PF 1 MG/ML IJ SOLN
0.0000 ug/min | INTRAVENOUS | Status: DC
  Filled 2017-01-27: qty 4

## 2017-01-27 MED ORDER — HEPARIN SODIUM (PORCINE) 1000 UNIT/ML IJ SOLN
INTRAMUSCULAR | Status: DC
Start: 2017-01-28 — End: 2017-01-28
  Filled 2017-01-27: qty 30

## 2017-01-27 MED ORDER — NITROGLYCERIN IN D5W 200-5 MCG/ML-% IV SOLN
2.0000 ug/min | INTRAVENOUS | Status: DC
  Filled 2017-01-27: qty 250

## 2017-01-27 MED ORDER — DEXTROSE 5 % IV SOLN
1.5000 g | INTRAVENOUS | Status: AC
  Administered 2017-01-28: 1.5 g via INTRAVENOUS
  Administered 2017-01-28: .75 g via INTRAVENOUS
  Filled 2017-01-27: qty 1.5

## 2017-01-27 MED ORDER — POTASSIUM CHLORIDE 2 MEQ/ML IV SOLN
80.0000 meq | INTRAVENOUS | Status: DC
  Filled 2017-01-27: qty 40

## 2017-01-27 MED ORDER — DEXTROSE 5 % IV SOLN
750.0000 mg | INTRAVENOUS | Status: DC
  Filled 2017-01-27: qty 750

## 2017-01-27 MED ORDER — SODIUM CHLORIDE 0.9 % IV SOLN
INTRAVENOUS | Status: AC
  Administered 2017-01-28: 1.7 [IU]/h via INTRAVENOUS
  Filled 2017-01-27: qty 1

## 2017-01-27 MED ORDER — TRANEXAMIC ACID 1000 MG/10ML IV SOLN
1.5000 mg/kg/h | INTRAVENOUS | Status: AC
  Administered 2017-01-28: 1.5 mg/kg/h via INTRAVENOUS
  Filled 2017-01-27: qty 25

## 2017-01-27 MED ORDER — ASPIRIN 81 MG PO CHEW
CHEWABLE_TABLET | ORAL | Status: AC
  Filled 2017-01-27: qty 1

## 2017-01-27 NOTE — Anesthesia Preprocedure Evaluation (Addendum)
Anesthesia Evaluation  Patient identified by MRN, date of birth, ID band Patient awake    Reviewed: Allergy & Precautions, NPO status , Patient's Chart, lab work & pertinent test results  History of Anesthesia Complications Negative for: history of anesthetic complications  Airway Mallampati: I  TM Distance: >3 FB Neck ROM: Full    Dental  (+) Dental Advisory Given, Chipped,    Pulmonary shortness of breath and with exertion, neg COPD, Current Smoker,    breath sounds clear to auscultation       Cardiovascular (-) angina+ CAD (3v ASCAD), + Past MI and + DOE   Rhythm:Regular Rate:Normal  01/24/17 ECHO: EF 60-65%, valves OK   Neuro/Psych  Headaches, neg Seizures negative psych ROS   GI/Hepatic negative GI ROS, (+) Hepatitis -, B  Endo/Other  diabetes (glu 145), Type 2, Oral Hypoglycemic AgentsMorbid obesity  Renal/GU      Musculoskeletal negative musculoskeletal ROS (+)   Abdominal   Peds  Hematology negative hematology ROS (+)   Anesthesia Other Findings Bearded   - Left ventricle: The cavity size was normal. Systolic function was   normal. The estimated ejection fraction was in the range of 60%   to 65%. Wall motion was normal; there were no regional wall   motion abnormalities.   Mid RCA lesion, 80 %stenosed. Proximal vessel is ectatic.  Post Atrio lesion, 100 %stenosed.  Ramus lesion, 95 %stenosed.  Ost LAD to Prox LAD lesion, 90 %stenosed.  The left ventricular systolic function is normal.  LV end diastolic pressure is normal.  The left ventricular ejection fraction is 55-65% by visual estimate.  There is no aortic valve stenosis.    Reproductive/Obstetrics                           Anesthesia Physical Anesthesia Plan  ASA: III  Anesthesia Plan: General   Post-op Pain Management:    Induction: Intravenous  PONV Risk Score and Plan: 2 and Treatment may vary due  to age or medical condition  Airway Management Planned: Oral ETT  Additional Equipment: Arterial line, CVP, PA Cath, TEE and Ultrasound Guidance Line Placement  Intra-op Plan:   Post-operative Plan: Post-operative intubation/ventilation  Informed Consent: I have reviewed the patients History and Physical, chart, labs and discussed the procedure including the risks, benefits and alternatives for the proposed anesthesia with the patient or authorized representative who has indicated his/her understanding and acceptance.   Dental advisory given  Plan Discussed with: CRNA and Surgeon  Anesthesia Plan Comments: (Plan routine monitors, A line, PA cath, GETA with TEE and post op ventilation)       Anesthesia Quick Evaluation

## 2017-01-27 NOTE — Progress Notes (Signed)
ANTICOAGULATION CONSULT NOTE  Pharmacy Consult for Heparin Indication: chest pain/ACS  No Known Allergies  Patient Measurements: Height: 6' (182.9 cm) Weight: 271 lb 9.6 oz (123.2 kg) IBW/kg (Calculated) : 77.6 Heparin Dosing Weight: 99 Kg  Vital Signs: Temp: 97.7 F (36.5 C) (09/16 0421) Temp Source: Oral (09/16 0421) BP: 120/99 (09/16 0421) Pulse Rate: 54 (09/16 0600)  Labs:  Recent Labs  01/24/17 1248 01/24/17 1902  01/25/17 0445 01/25/17 1211 01/26/17 0449 01/27/17 0209  HGB  --   --   < > 15.6  --  15.1 15.9  HCT  --   --   --  46.2  --  44.9 46.9  PLT  --   --   --  170  --  154 173  APTT  --   --   --   --   --  66*  --   LABPROT 13.2  --   --   --   --   --   --   INR 1.01  --   --   --   --   --   --   HEPARINUNFRC  --   --   < > 0.38 0.34 0.37 0.43  CREATININE  --   --   --   --   --  1.16  --   TROPONINI 2.54* 3.26*  --   --   --   --   --   < > = values in this interval not displayed.  Estimated Creatinine Clearance: 94.1 mL/min (by C-G formula based on SCr of 1.16 mg/dL).  Assessment:  56 yoM sent from urgent care with abnormal EKG and chest pain earlier this week. No anticoagulation PTA. He is noted with NSTEMI s/p cath with 3VCAD for CABG on 9/17.  Heparin level is within goal range at 0.43 today on 1800 units/hr. H/H and platelets remain stable. No signs/symptoms of bleeding in chart.  Goal of Therapy:  Heparin level 0.3-0.7 units/ml Monitor platelets by anticoagulation protocol: Yes   Plan:  -Continue heparin at 1,800 units/hr -Daily heparin level and CBC -Per Dr. Dorris Fetch: heparin stop time 9/17 at 4am  Girard Cooter, PharmD Clinical Pharmacist  Phone: [phone]

## 2017-01-28 ENCOUNTER — Inpatient Hospital Stay (HOSPITAL_COMMUNITY): Payer: BLUE CROSS/BLUE SHIELD

## 2017-01-28 ENCOUNTER — Encounter (HOSPITAL_COMMUNITY): Payer: Self-pay | Admitting: Certified Registered Nurse Anesthetist

## 2017-01-28 ENCOUNTER — Inpatient Hospital Stay (HOSPITAL_COMMUNITY)
Admission: EM | Disposition: A | Discharge status: Home / Self Care | Payer: Self-pay | Source: Home / Self Care | Attending: Cardiovascular Disease

## 2017-01-28 ENCOUNTER — Inpatient Hospital Stay (HOSPITAL_COMMUNITY): Payer: BLUE CROSS/BLUE SHIELD | Admitting: Certified Registered Nurse Anesthetist

## 2017-01-28 DIAGNOSIS — Z951 Presence of aortocoronary bypass graft: Secondary | ICD-10-CM

## 2017-01-28 HISTORY — PX: TEE WITHOUT CARDIOVERSION: SHX5443

## 2017-01-28 HISTORY — PX: CORONARY ARTERY BYPASS GRAFT: SHX141

## 2017-01-28 LAB — POCT I-STAT 3, ART BLOOD GAS (G3+)
ACID-BASE DEFICIT: 1 mmol/L (ref 0.0–2.0)
ACID-BASE DEFICIT: 2 mmol/L (ref 0.0–2.0)
Acid-base deficit: 2 mmol/L (ref 0.0–2.0)
Acid-base deficit: 4 mmol/L — ABNORMAL HIGH (ref 0.0–2.0)
Acid-base deficit: 4 mmol/L — ABNORMAL HIGH (ref 0.0–2.0)
BICARBONATE: 24.2 mmol/L (ref 20.0–28.0)
BICARBONATE: 23.5 mmol/L (ref 20.0–28.0)
BICARBONATE: 21.8 mmol/L (ref 20.0–28.0)
Bicarbonate: 25.2 mmol/L (ref 20.0–28.0)
Bicarbonate: 21.6 mmol/L (ref 20.0–28.0)
O2 SAT: 100 %
O2 Saturation: 94 %
O2 Saturation: 95 %
O2 Saturation: 95 %
O2 Saturation: 95 %
PCO2 ART: 49.6 mmHg — AB (ref 32.0–48.0)
PCO2 ART: 39.3 mmHg (ref 32.0–48.0)
PCO2 ART: 40.4 mmHg (ref 32.0–48.0)
PO2 ART: 289 mmHg — AB (ref 83.0–108.0)
PO2 ART: 76 mmHg — AB (ref 83.0–108.0)
Patient temperature: 36.4
Patient temperature: 36.5
Patient temperature: 36.5
TCO2: 27 mmol/L (ref 22–32)
TCO2: 26 mmol/L (ref 22–32)
TCO2: 25 mmol/L (ref 22–32)
TCO2: 23 mmol/L (ref 22–32)
TCO2: 23 mmol/L (ref 22–32)
pCO2 arterial: 42.6 mmHg (ref 32.0–48.0)
pCO2 arterial: 40.4 mmHg (ref 32.0–48.0)
pH, Arterial: 7.314 — ABNORMAL LOW (ref 7.350–7.450)
pH, Arterial: 7.359 (ref 7.350–7.450)
pH, Arterial: 7.369 (ref 7.350–7.450)
pH, Arterial: 7.35 (ref 7.350–7.450)
pH, Arterial: 7.333 — ABNORMAL LOW (ref 7.350–7.450)
pO2, Arterial: 71 mmHg — ABNORMAL LOW (ref 83.0–108.0)
pO2, Arterial: 76 mmHg — ABNORMAL LOW (ref 83.0–108.0)
pO2, Arterial: 76 mmHg — ABNORMAL LOW (ref 83.0–108.0)

## 2017-01-28 LAB — POCT I-STAT, CHEM 8
BUN: 24 mg/dL — ABNORMAL HIGH (ref 6–20)
BUN: 20 mg/dL (ref 6–20)
BUN: 22 mg/dL — ABNORMAL HIGH (ref 6–20)
BUN: 21 mg/dL — AB (ref 6–20)
BUN: 20 mg/dL (ref 6–20)
BUN: 20 mg/dL (ref 6–20)
BUN: 21 mg/dL — AB (ref 6–20)
BUN: 18 mg/dL (ref 6–20)
CALCIUM ION: 1.01 mmol/L — AB (ref 1.15–1.40)
CALCIUM ION: 1.01 mmol/L — AB (ref 1.15–1.40)
CALCIUM ION: 1.03 mmol/L — AB (ref 1.15–1.40)
CHLORIDE: 102 mmol/L (ref 101–111)
CHLORIDE: 103 mmol/L (ref 101–111)
CHLORIDE: 105 mmol/L (ref 101–111)
CREATININE: 0.8 mg/dL (ref 0.61–1.24)
CREATININE: 0.9 mg/dL (ref 0.61–1.24)
CREATININE: 0.9 mg/dL (ref 0.61–1.24)
Calcium, Ion: 1.22 mmol/L (ref 1.15–1.40)
Calcium, Ion: 1.04 mmol/L — ABNORMAL LOW (ref 1.15–1.40)
Calcium, Ion: 1.16 mmol/L (ref 1.15–1.40)
Calcium, Ion: 1.03 mmol/L — ABNORMAL LOW (ref 1.15–1.40)
Calcium, Ion: 1.13 mmol/L — ABNORMAL LOW (ref 1.15–1.40)
Chloride: 100 mmol/L — ABNORMAL LOW (ref 101–111)
Chloride: 105 mmol/L (ref 101–111)
Chloride: 102 mmol/L (ref 101–111)
Chloride: 102 mmol/L (ref 101–111)
Chloride: 102 mmol/L (ref 101–111)
Creatinine, Ser: 0.9 mg/dL (ref 0.61–1.24)
Creatinine, Ser: 0.9 mg/dL (ref 0.61–1.24)
Creatinine, Ser: 0.9 mg/dL (ref 0.61–1.24)
Creatinine, Ser: 0.9 mg/dL (ref 0.61–1.24)
Creatinine, Ser: 0.8 mg/dL (ref 0.61–1.24)
GLUCOSE: 154 mg/dL — AB (ref 65–99)
GLUCOSE: 149 mg/dL — AB (ref 65–99)
Glucose, Bld: 147 mg/dL — ABNORMAL HIGH (ref 65–99)
Glucose, Bld: 141 mg/dL — ABNORMAL HIGH (ref 65–99)
Glucose, Bld: 150 mg/dL — ABNORMAL HIGH (ref 65–99)
Glucose, Bld: 150 mg/dL — ABNORMAL HIGH (ref 65–99)
Glucose, Bld: 158 mg/dL — ABNORMAL HIGH (ref 65–99)
Glucose, Bld: 112 mg/dL — ABNORMAL HIGH (ref 65–99)
HCT: 47 % (ref 39.0–52.0)
HCT: 36 % — ABNORMAL LOW (ref 39.0–52.0)
HCT: 44 % (ref 39.0–52.0)
HCT: 38 % — ABNORMAL LOW (ref 39.0–52.0)
HCT: 34 % — ABNORMAL LOW (ref 39.0–52.0)
HCT: 34 % — ABNORMAL LOW (ref 39.0–52.0)
HCT: 39 % (ref 39.0–52.0)
HEMATOCRIT: 44 % (ref 39.0–52.0)
HEMOGLOBIN: 11.6 g/dL — AB (ref 13.0–17.0)
HEMOGLOBIN: 15 g/dL (ref 13.0–17.0)
Hemoglobin: 16 g/dL (ref 13.0–17.0)
Hemoglobin: 12.2 g/dL — ABNORMAL LOW (ref 13.0–17.0)
Hemoglobin: 15 g/dL (ref 13.0–17.0)
Hemoglobin: 12.9 g/dL — ABNORMAL LOW (ref 13.0–17.0)
Hemoglobin: 11.6 g/dL — ABNORMAL LOW (ref 13.0–17.0)
Hemoglobin: 13.3 g/dL (ref 13.0–17.0)
POTASSIUM: 5.4 mmol/L — AB (ref 3.5–5.1)
POTASSIUM: 4.3 mmol/L (ref 3.5–5.1)
Potassium: 5.2 mmol/L — ABNORMAL HIGH (ref 3.5–5.1)
Potassium: 5.5 mmol/L — ABNORMAL HIGH (ref 3.5–5.1)
Potassium: 6.3 mmol/L (ref 3.5–5.1)
Potassium: 4.6 mmol/L (ref 3.5–5.1)
Potassium: 4.6 mmol/L (ref 3.5–5.1)
Potassium: 5.9 mmol/L — ABNORMAL HIGH (ref 3.5–5.1)
SODIUM: 137 mmol/L (ref 135–145)
Sodium: 136 mmol/L (ref 135–145)
Sodium: 137 mmol/L (ref 135–145)
Sodium: 135 mmol/L (ref 135–145)
Sodium: 135 mmol/L (ref 135–145)
Sodium: 137 mmol/L (ref 135–145)
Sodium: 137 mmol/L (ref 135–145)
Sodium: 138 mmol/L (ref 135–145)
TCO2: 28 mmol/L (ref 22–32)
TCO2: 25 mmol/L (ref 22–32)
TCO2: 26 mmol/L (ref 22–32)
TCO2: 24 mmol/L (ref 22–32)
TCO2: 23 mmol/L (ref 22–32)
TCO2: 25 mmol/L (ref 22–32)
TCO2: 26 mmol/L (ref 22–32)
TCO2: 22 mmol/L (ref 22–32)

## 2017-01-28 LAB — CBC
HCT: 46.1 % (ref 39.0–52.0)
HCT: 44.2 % (ref 39.0–52.0)
HEMATOCRIT: 43.6 % (ref 39.0–52.0)
Hemoglobin: 15.6 g/dL (ref 13.0–17.0)
Hemoglobin: 14.7 g/dL (ref 13.0–17.0)
Hemoglobin: 15 g/dL (ref 13.0–17.0)
MCH: 32.4 pg (ref 26.0–34.0)
MCH: 32.1 pg (ref 26.0–34.0)
MCH: 32.1 pg (ref 26.0–34.0)
MCHC: 33.8 g/dL (ref 30.0–36.0)
MCHC: 33.7 g/dL (ref 30.0–36.0)
MCHC: 33.9 g/dL (ref 30.0–36.0)
MCV: 95.6 fL (ref 78.0–100.0)
MCV: 95.2 fL (ref 78.0–100.0)
MCV: 94.6 fL (ref 78.0–100.0)
PLATELETS: 183 10*3/uL (ref 150–400)
PLATELETS: 135 10*3/uL — AB (ref 150–400)
PLATELETS: 160 10*3/uL (ref 150–400)
RBC: 4.82 MIL/uL (ref 4.22–5.81)
RBC: 4.58 MIL/uL (ref 4.22–5.81)
RBC: 4.67 MIL/uL (ref 4.22–5.81)
RDW: 13.2 % (ref 11.5–15.5)
RDW: 13.3 % (ref 11.5–15.5)
RDW: 13.1 % (ref 11.5–15.5)
WBC: 8.5 10*3/uL (ref 4.0–10.5)
WBC: 16.2 10*3/uL — AB (ref 4.0–10.5)
WBC: 17.6 10*3/uL — AB (ref 4.0–10.5)

## 2017-01-28 LAB — GLUCOSE, CAPILLARY
GLUCOSE-CAPILLARY: 145 mg/dL — AB (ref 65–99)
GLUCOSE-CAPILLARY: 125 mg/dL — AB (ref 65–99)
GLUCOSE-CAPILLARY: 113 mg/dL — AB (ref 65–99)
GLUCOSE-CAPILLARY: 128 mg/dL — AB (ref 65–99)
Glucose-Capillary: 132 mg/dL — ABNORMAL HIGH (ref 65–99)
Glucose-Capillary: 115 mg/dL — ABNORMAL HIGH (ref 65–99)
Glucose-Capillary: 111 mg/dL — ABNORMAL HIGH (ref 65–99)
Glucose-Capillary: 122 mg/dL — ABNORMAL HIGH (ref 65–99)

## 2017-01-28 LAB — POCT I-STAT 4, (NA,K, GLUC, HGB,HCT)
GLUCOSE: 153 mg/dL — AB (ref 65–99)
HEMATOCRIT: 43 % (ref 39.0–52.0)
Hemoglobin: 14.6 g/dL (ref 13.0–17.0)
Potassium: 4.2 mmol/L (ref 3.5–5.1)
Sodium: 139 mmol/L (ref 135–145)

## 2017-01-28 LAB — HEMOGLOBIN AND HEMATOCRIT, BLOOD
HCT: 39.1 % (ref 39.0–52.0)
Hemoglobin: 13.5 g/dL (ref 13.0–17.0)

## 2017-01-28 LAB — PROTIME-INR
INR: 1.26
Prothrombin Time: 15.7 seconds — ABNORMAL HIGH (ref 11.4–15.2)

## 2017-01-28 LAB — CREATININE, SERUM: CREATININE: 1.05 mg/dL (ref 0.61–1.24)

## 2017-01-28 LAB — PLATELET COUNT: PLATELETS: 126 10*3/uL — AB (ref 150–400)

## 2017-01-28 LAB — HEPARIN LEVEL (UNFRACTIONATED): Heparin Unfractionated: 0.34 IU/mL (ref 0.30–0.70)

## 2017-01-28 LAB — MAGNESIUM: MAGNESIUM: 2.7 mg/dL — AB (ref 1.7–2.4)

## 2017-01-28 LAB — APTT: APTT: 25 s (ref 24–36)

## 2017-01-28 SURGERY — CORONARY ARTERY BYPASS GRAFTING (CABG)
Anesthesia: General | Site: Chest

## 2017-01-28 MED ORDER — TRAMADOL HCL 50 MG PO TABS
50.0000 mg | ORAL_TABLET | ORAL | Status: DC | PRN
  Administered 2017-01-29: 100 mg via ORAL
  Filled 2017-01-28: qty 2

## 2017-01-28 MED ORDER — SODIUM CHLORIDE 0.9 % IV SOLN
0.0000 ug/kg/h | INTRAVENOUS | Status: DC
  Filled 2017-01-28: qty 2

## 2017-01-28 MED ORDER — ALBUMIN HUMAN 5 % IV SOLN
250.0000 mL | INTRAVENOUS | Status: AC | PRN
  Administered 2017-01-28: 250 mL via INTRAVENOUS

## 2017-01-28 MED ORDER — PANTOPRAZOLE SODIUM 40 MG PO TBEC
40.0000 mg | DELAYED_RELEASE_TABLET | Freq: Every day | ORAL | Status: DC
  Administered 2017-01-30 – 2017-02-01 (×3): 40 mg via ORAL
  Filled 2017-01-28 (×3): qty 1

## 2017-01-28 MED ORDER — LACTATED RINGERS IV SOLN: INTRAVENOUS | Status: DC

## 2017-01-28 MED ORDER — MIDAZOLAM HCL 5 MG/5ML IJ SOLN
INTRAMUSCULAR | Status: DC | PRN
  Administered 2017-01-28: 1 mg via INTRAVENOUS
  Administered 2017-01-28 (×2): 2 mg via INTRAVENOUS
  Administered 2017-01-28: 1 mg via INTRAVENOUS
  Administered 2017-01-28: 3 mg via INTRAVENOUS
  Administered 2017-01-28: 1 mg via INTRAVENOUS

## 2017-01-28 MED ORDER — ARTIFICIAL TEARS OPHTHALMIC OINT
TOPICAL_OINTMENT | OPHTHALMIC | Status: DC | PRN
  Administered 2017-01-28: 1 via OPHTHALMIC

## 2017-01-28 MED ORDER — METOPROLOL TARTRATE 5 MG/5ML IV SOLN: 2.5000 mg | INTRAVENOUS | Status: DC | PRN

## 2017-01-28 MED ORDER — ALBUMIN HUMAN 5 % IV SOLN
INTRAVENOUS | Status: DC | PRN
  Administered 2017-01-28: 13:00:00 via INTRAVENOUS

## 2017-01-28 MED ORDER — METOPROLOL TARTRATE 12.5 MG HALF TABLET
12.5000 mg | ORAL_TABLET | Freq: Two times a day (BID) | ORAL | Status: DC
  Administered 2017-01-29 – 2017-02-01 (×6): 12.5 mg via ORAL
  Filled 2017-01-28 (×7): qty 1

## 2017-01-28 MED ORDER — BISACODYL 10 MG RE SUPP: 10.0000 mg | Freq: Every day | RECTAL | Status: DC

## 2017-01-28 MED ORDER — ASPIRIN 81 MG PO CHEW: 324.0000 mg | CHEWABLE_TABLET | Freq: Every day | ORAL | Status: DC

## 2017-01-28 MED ORDER — ACETAMINOPHEN 650 MG RE SUPP
650.0000 mg | Freq: Once | RECTAL | Status: AC
  Administered 2017-01-28: 650 mg via RECTAL

## 2017-01-28 MED ORDER — ASPIRIN EC 325 MG PO TBEC
325.0000 mg | DELAYED_RELEASE_TABLET | Freq: Every day | ORAL | Status: DC
  Administered 2017-01-29 – 2017-02-01 (×4): 325 mg via ORAL
  Filled 2017-01-28 (×4): qty 1

## 2017-01-28 MED ORDER — SODIUM CHLORIDE 0.9 % IV SOLN: 250.0000 mL | INTRAVENOUS | Status: DC

## 2017-01-28 MED ORDER — FENTANYL CITRATE (PF) 250 MCG/5ML IJ SOLN
INTRAMUSCULAR | Status: DC | PRN
  Administered 2017-01-28: 50 ug via INTRAVENOUS
  Administered 2017-01-28: 150 ug via INTRAVENOUS
  Administered 2017-01-28: 100 ug via INTRAVENOUS
  Administered 2017-01-28: 150 ug via INTRAVENOUS
  Administered 2017-01-28: 500 ug via INTRAVENOUS
  Administered 2017-01-28: 100 ug via INTRAVENOUS
  Administered 2017-01-28: 200 ug via INTRAVENOUS

## 2017-01-28 MED ORDER — METOPROLOL TARTRATE 25 MG/10 ML ORAL SUSPENSION: 12.5000 mg | Freq: Two times a day (BID) | ORAL | Status: DC

## 2017-01-28 MED ORDER — OXYCODONE HCL 5 MG PO TABS
5.0000 mg | ORAL_TABLET | ORAL | Status: DC | PRN
  Administered 2017-01-29 – 2017-01-31 (×7): 10 mg via ORAL
  Filled 2017-01-28 (×7): qty 2

## 2017-01-28 MED ORDER — ROCURONIUM BROMIDE 10 MG/ML (PF) SYRINGE
PREFILLED_SYRINGE | INTRAVENOUS | Status: DC | PRN
  Administered 2017-01-28 (×2): 50 mg via INTRAVENOUS
  Administered 2017-01-28: 40 mg via INTRAVENOUS
  Administered 2017-01-28: 60 mg via INTRAVENOUS
  Administered 2017-01-28 (×2): 50 mg via INTRAVENOUS

## 2017-01-28 MED ORDER — HEPARIN SODIUM (PORCINE) 1000 UNIT/ML IJ SOLN
INTRAMUSCULAR | Status: AC
  Filled 2017-01-28: qty 1

## 2017-01-28 MED ORDER — SODIUM CHLORIDE 0.9% FLUSH
3.0000 mL | Freq: Two times a day (BID) | INTRAVENOUS | Status: DC
  Administered 2017-01-29 – 2017-01-30 (×3): 3 mL via INTRAVENOUS

## 2017-01-28 MED ORDER — MIDAZOLAM HCL 10 MG/2ML IJ SOLN
INTRAMUSCULAR | Status: AC
  Filled 2017-01-28: qty 2

## 2017-01-28 MED ORDER — LACTATED RINGERS IV SOLN
INTRAVENOUS | Status: DC | PRN
  Administered 2017-01-28: 07:00:00 via INTRAVENOUS

## 2017-01-28 MED ORDER — SUCCINYLCHOLINE CHLORIDE 20 MG/ML IJ SOLN
INTRAMUSCULAR | Status: DC | PRN
  Administered 2017-01-28: 200 mg via INTRAVENOUS

## 2017-01-28 MED ORDER — TRANEXAMIC ACID 1000 MG/10ML IV SOLN
1.5000 mg/kg/h | INTRAVENOUS | Status: DC
  Filled 2017-01-28: qty 25

## 2017-01-28 MED ORDER — SUCCINYLCHOLINE CHLORIDE 200 MG/10ML IV SOSY
PREFILLED_SYRINGE | INTRAVENOUS | Status: AC
  Filled 2017-01-28: qty 10

## 2017-01-28 MED ORDER — INSULIN REGULAR BOLUS VIA INFUSION
0.0000 [IU] | Freq: Three times a day (TID) | INTRAVENOUS | Status: DC
  Administered 2017-01-29: 2 [IU] via INTRAVENOUS
  Filled 2017-01-28: qty 10

## 2017-01-28 MED ORDER — PROPOFOL 10 MG/ML IV BOLUS
INTRAVENOUS | Status: AC
  Filled 2017-01-28: qty 20

## 2017-01-28 MED ORDER — 0.9 % SODIUM CHLORIDE (POUR BTL) OPTIME
TOPICAL | Status: DC | PRN
  Administered 2017-01-28: 5000 mL
  Administered 2017-01-28: 1000 mL

## 2017-01-28 MED ORDER — FENTANYL CITRATE (PF) 250 MCG/5ML IJ SOLN
INTRAMUSCULAR | Status: AC
  Filled 2017-01-28: qty 20

## 2017-01-28 MED ORDER — DOPAMINE-DEXTROSE 3.2-5 MG/ML-% IV SOLN
INTRAVENOUS | Status: DC | PRN
  Administered 2017-01-28: 3 ug/kg/min via INTRAVENOUS

## 2017-01-28 MED ORDER — ACETAMINOPHEN 160 MG/5ML PO SOLN: 650.0000 mg | Freq: Once | ORAL | Status: AC

## 2017-01-28 MED ORDER — ACETAMINOPHEN 160 MG/5ML PO SOLN: 1000.0000 mg | Freq: Four times a day (QID) | ORAL | Status: DC

## 2017-01-28 MED ORDER — BISACODYL 5 MG PO TBEC
10.0000 mg | DELAYED_RELEASE_TABLET | Freq: Every day | ORAL | Status: DC
  Administered 2017-01-29 – 2017-01-30 (×2): 10 mg via ORAL
  Filled 2017-01-28 (×3): qty 2

## 2017-01-28 MED ORDER — ACETAMINOPHEN 500 MG PO TABS
1000.0000 mg | ORAL_TABLET | Freq: Four times a day (QID) | ORAL | Status: DC
  Administered 2017-01-28 – 2017-02-01 (×13): 1000 mg via ORAL
  Administered 2017-02-01: 500 mg via ORAL
  Filled 2017-01-28 (×15): qty 2

## 2017-01-28 MED ORDER — CHLORHEXIDINE GLUCONATE CLOTH 2 % EX PADS
6.0000 | MEDICATED_PAD | Freq: Every day | CUTANEOUS | Status: DC
  Administered 2017-01-28 – 2017-01-29 (×2): 6 via TOPICAL

## 2017-01-28 MED ORDER — SODIUM CHLORIDE 0.9% FLUSH
10.0000 mL | Freq: Two times a day (BID) | INTRAVENOUS | Status: DC
  Administered 2017-01-30: 10 mL

## 2017-01-28 MED ORDER — HEPARIN SODIUM (PORCINE) 1000 UNIT/ML IJ SOLN
INTRAMUSCULAR | Status: DC | PRN
  Administered 2017-01-28: 41000 [IU] via INTRAVENOUS

## 2017-01-28 MED ORDER — ROCURONIUM BROMIDE 10 MG/ML (PF) SYRINGE
PREFILLED_SYRINGE | INTRAVENOUS | Status: AC
  Filled 2017-01-28: qty 10

## 2017-01-28 MED ORDER — SODIUM CHLORIDE 0.9 % IV SOLN
INTRAVENOUS | Status: DC
  Administered 2017-01-28: 14:00:00 via INTRAVENOUS

## 2017-01-28 MED ORDER — MIDAZOLAM HCL 2 MG/2ML IJ SOLN: 2.0000 mg | INTRAMUSCULAR | Status: DC | PRN

## 2017-01-28 MED ORDER — CHLORHEXIDINE GLUCONATE 0.12 % MT SOLN
15.0000 mL | Freq: Two times a day (BID) | OROMUCOSAL | Status: DC
  Administered 2017-01-28 – 2017-01-30 (×4): 15 mL via OROMUCOSAL
  Filled 2017-01-28 (×4): qty 15

## 2017-01-28 MED ORDER — VANCOMYCIN HCL IN DEXTROSE 1-5 GM/200ML-% IV SOLN
1000.0000 mg | Freq: Once | INTRAVENOUS | Status: AC
  Administered 2017-01-28: 1000 mg via INTRAVENOUS
  Filled 2017-01-28: qty 200

## 2017-01-28 MED ORDER — ORAL CARE MOUTH RINSE
15.0000 mL | Freq: Two times a day (BID) | OROMUCOSAL | Status: DC
  Administered 2017-01-28: 15 mL via OROMUCOSAL

## 2017-01-28 MED ORDER — FAMOTIDINE IN NACL 20-0.9 MG/50ML-% IV SOLN
20.0000 mg | Freq: Two times a day (BID) | INTRAVENOUS | Status: AC
  Administered 2017-01-28: 20 mg via INTRAVENOUS

## 2017-01-28 MED ORDER — SODIUM CHLORIDE 0.45 % IV SOLN
INTRAVENOUS | Status: DC | PRN
  Administered 2017-01-28: 14:00:00 via INTRAVENOUS

## 2017-01-28 MED ORDER — FENTANYL CITRATE (PF) 250 MCG/5ML IJ SOLN
INTRAMUSCULAR | Status: AC
  Filled 2017-01-28: qty 5

## 2017-01-28 MED ORDER — MORPHINE SULFATE (PF) 4 MG/ML IV SOLN: 1.0000 mg | INTRAVENOUS | Status: AC | PRN

## 2017-01-28 MED ORDER — MAGNESIUM SULFATE 4 GM/100ML IV SOLN
4.0000 g | Freq: Once | INTRAVENOUS | Status: AC
  Administered 2017-01-28: 4 g via INTRAVENOUS
  Filled 2017-01-28: qty 100

## 2017-01-28 MED ORDER — PROTAMINE SULFATE 10 MG/ML IV SOLN
INTRAVENOUS | Status: DC | PRN
  Administered 2017-01-28: 380 mg via INTRAVENOUS

## 2017-01-28 MED ORDER — SODIUM CHLORIDE 0.9 % IJ SOLN
OROMUCOSAL | Status: DC | PRN
  Administered 2017-01-28 (×3): 4 mL via TOPICAL

## 2017-01-28 MED ORDER — NITROGLYCERIN IN D5W 200-5 MCG/ML-% IV SOLN: 0.0000 ug/min | INTRAVENOUS | Status: DC

## 2017-01-28 MED ORDER — HEMOSTATIC AGENTS (NO CHARGE) OPTIME
TOPICAL | Status: DC | PRN
  Administered 2017-01-28: 1 via TOPICAL

## 2017-01-28 MED ORDER — SODIUM CHLORIDE 0.9% FLUSH: 3.0000 mL | INTRAVENOUS | Status: DC | PRN

## 2017-01-28 MED ORDER — MORPHINE SULFATE (PF) 4 MG/ML IV SOLN
2.0000 mg | INTRAVENOUS | Status: DC | PRN
  Administered 2017-01-28: 2 mg via INTRAVENOUS
  Administered 2017-01-29 (×2): 4 mg via INTRAVENOUS
  Filled 2017-01-28 (×3): qty 1

## 2017-01-28 MED ORDER — CHLORHEXIDINE GLUCONATE 0.12 % MT SOLN
15.0000 mL | OROMUCOSAL | Status: AC
  Administered 2017-01-28: 15 mL via OROMUCOSAL
  Filled 2017-01-28: qty 15

## 2017-01-28 MED ORDER — ORAL CARE MOUTH RINSE: 15.0000 mL | Freq: Two times a day (BID) | OROMUCOSAL | Status: DC

## 2017-01-28 MED ORDER — PHENYLEPHRINE 40 MCG/ML (10ML) SYRINGE FOR IV PUSH (FOR BLOOD PRESSURE SUPPORT)
PREFILLED_SYRINGE | INTRAVENOUS | Status: AC
  Filled 2017-01-28: qty 10

## 2017-01-28 MED ORDER — ONDANSETRON HCL 4 MG/2ML IJ SOLN
INTRAMUSCULAR | Status: AC
  Filled 2017-01-28: qty 2

## 2017-01-28 MED ORDER — PROPOFOL 10 MG/ML IV BOLUS
INTRAVENOUS | Status: DC | PRN
  Administered 2017-01-28: 30 mg via INTRAVENOUS

## 2017-01-28 MED ORDER — DOCUSATE SODIUM 100 MG PO CAPS
200.0000 mg | ORAL_CAPSULE | Freq: Every day | ORAL | Status: DC
  Administered 2017-01-29 – 2017-01-31 (×3): 200 mg via ORAL
  Filled 2017-01-28 (×3): qty 2

## 2017-01-28 MED ORDER — SODIUM CHLORIDE 0.9% FLUSH
10.0000 mL | INTRAVENOUS | Status: DC | PRN
  Administered 2017-01-30: 10 mL
  Filled 2017-01-28: qty 40

## 2017-01-28 MED ORDER — DEXTROSE 5 % IV SOLN
1.5000 g | Freq: Two times a day (BID) | INTRAVENOUS | Status: AC
  Administered 2017-01-28 – 2017-01-30 (×4): 1.5 g via INTRAVENOUS
  Filled 2017-01-28 (×4): qty 1.5

## 2017-01-28 MED ORDER — LACTATED RINGERS IV SOLN: 500.0000 mL | Freq: Once | INTRAVENOUS | Status: DC | PRN

## 2017-01-28 MED ORDER — SODIUM CHLORIDE 0.9 % IV SOLN
INTRAVENOUS | Status: DC
  Administered 2017-01-29: 0.6 [IU]/h via INTRAVENOUS
  Filled 2017-01-28 (×2): qty 1

## 2017-01-28 MED ORDER — SODIUM CHLORIDE 0.9 % IV SOLN
0.0000 ug/min | INTRAVENOUS | Status: DC
  Administered 2017-01-28: 40 ug/min via INTRAVENOUS
  Filled 2017-01-28 (×3): qty 2

## 2017-01-28 MED ORDER — ONDANSETRON HCL 4 MG/2ML IJ SOLN
4.0000 mg | Freq: Four times a day (QID) | INTRAMUSCULAR | Status: DC | PRN
  Administered 2017-01-28: 4 mg via INTRAVENOUS
  Filled 2017-01-28: qty 2

## 2017-01-28 MED ORDER — POTASSIUM CHLORIDE 10 MEQ/50ML IV SOLN: 10.0000 meq | INTRAVENOUS | Status: DC

## 2017-01-28 MED FILL — Lidocaine HCl IV Inj 20 MG/ML: INTRAVENOUS | Qty: 5 | Status: AC

## 2017-01-28 MED FILL — Mannitol IV Soln 20%: INTRAVENOUS | Qty: 500 | Status: AC

## 2017-01-28 MED FILL — Magnesium Sulfate Inj 50%: INTRAMUSCULAR | Qty: 10 | Status: AC

## 2017-01-28 MED FILL — Sodium Chloride IV Soln 0.9%: INTRAVENOUS | Qty: 2000 | Status: AC

## 2017-01-28 MED FILL — Potassium Chloride Inj 2 mEq/ML: INTRAVENOUS | Qty: 40 | Status: AC

## 2017-01-28 MED FILL — Heparin Sodium (Porcine) Inj 1000 Unit/ML: INTRAMUSCULAR | Qty: 30 | Status: AC

## 2017-01-28 MED FILL — Heparin Sodium (Porcine) Inj 1000 Unit/ML: INTRAMUSCULAR | Qty: 10 | Status: AC

## 2017-01-28 MED FILL — Sodium Bicarbonate IV Soln 8.4%: INTRAVENOUS | Qty: 50 | Status: AC

## 2017-01-28 MED FILL — Electrolyte-R (PH 7.4) Solution: INTRAVENOUS | Qty: 5000 | Status: AC

## 2017-01-28 SURGICAL SUPPLY — 87 items
BAG DECANTER FOR FLEXI CONT (MISCELLANEOUS) ×3 IMPLANT
BANDAGE ACE 4X5 VEL STRL LF (GAUZE/BANDAGES/DRESSINGS) ×3 IMPLANT
BANDAGE ACE 6X5 VEL STRL LF (GAUZE/BANDAGES/DRESSINGS) ×3 IMPLANT
BASKET HEART (ORDER IN 25'S) (MISCELLANEOUS) ×1
BASKET HEART (ORDER IN 25S) (MISCELLANEOUS) ×2 IMPLANT
BLADE STERNUM SYSTEM 6 (BLADE) ×3 IMPLANT
BNDG GAUZE ELAST 4 BULKY (GAUZE/BANDAGES/DRESSINGS) ×3 IMPLANT
CANISTER SUCT 3000ML PPV (MISCELLANEOUS) ×3 IMPLANT
CANNULA EZ GLIDE 8.0 24FR (CANNULA) ×3 IMPLANT
CANNULA EZ GLIDE AORTIC 21FR (CANNULA) ×3 IMPLANT
CATH CPB KIT HENDRICKSON (MISCELLANEOUS) ×3 IMPLANT
CATH ROBINSON RED A/P 18FR (CATHETERS) ×6 IMPLANT
CATH THORACIC 36FR (CATHETERS) ×3 IMPLANT
CATH THORACIC 36FR RT ANG (CATHETERS) ×3 IMPLANT
CLIP VESOCCLUDE MED 24/CT (CLIP) IMPLANT
CLIP VESOCCLUDE SM WIDE 24/CT (CLIP) ×3 IMPLANT
CRADLE DONUT ADULT HEAD (MISCELLANEOUS) ×3 IMPLANT
DRAPE CARDIOVASCULAR INCISE (DRAPES) ×1
DRAPE SLUSH/WARMER DISC (DRAPES) ×3 IMPLANT
DRAPE SRG 135X102X78XABS (DRAPES) ×2 IMPLANT
DRSG AQUACEL AG ADV 3.5X14 (GAUZE/BANDAGES/DRESSINGS) ×3 IMPLANT
ELECT REM PT RETURN 9FT ADLT (ELECTROSURGICAL) ×6
ELECTRODE REM PT RTRN 9FT ADLT (ELECTROSURGICAL) ×4 IMPLANT
FELT TEFLON 1X6 (MISCELLANEOUS) ×3 IMPLANT
GAUZE SPONGE 4X4 12PLY STRL LF (GAUZE/BANDAGES/DRESSINGS) ×6 IMPLANT
GLOVE BIO SURGEON STRL SZ7.5 (GLOVE) ×6 IMPLANT
GLOVE BIOGEL PI IND STRL 6.5 (GLOVE) ×12 IMPLANT
GLOVE BIOGEL PI INDICATOR 6.5 (GLOVE) ×6
GLOVE SURG SIGNA 7.5 PF LTX (GLOVE) ×9 IMPLANT
GOWN STRL REUS W/ TWL LRG LVL3 (GOWN DISPOSABLE) ×12 IMPLANT
GOWN STRL REUS W/ TWL XL LVL3 (GOWN DISPOSABLE) ×4 IMPLANT
GOWN STRL REUS W/TWL LRG LVL3 (GOWN DISPOSABLE) ×6
GOWN STRL REUS W/TWL XL LVL3 (GOWN DISPOSABLE) ×2
HEMOSTAT POWDER SURGIFOAM 1G (HEMOSTASIS) ×9 IMPLANT
HEMOSTAT SURGICEL 2X14 (HEMOSTASIS) ×3 IMPLANT
INSERT FOGARTY XLG (MISCELLANEOUS) ×3 IMPLANT
KIT BASIN OR (CUSTOM PROCEDURE TRAY) ×3 IMPLANT
KIT ROOM TURNOVER OR (KITS) ×3 IMPLANT
KIT SUCTION CATH 14FR (SUCTIONS) ×6 IMPLANT
KIT VASOVIEW HEMOPRO VH 3000 (KITS) ×3 IMPLANT
MARKER GRAFT CORONARY BYPASS (MISCELLANEOUS) ×9 IMPLANT
NS IRRIG 1000ML POUR BTL (IV SOLUTION) ×18 IMPLANT
PACK OPEN HEART (CUSTOM PROCEDURE TRAY) ×3 IMPLANT
PAD ARMBOARD 7.5X6 YLW CONV (MISCELLANEOUS) ×6 IMPLANT
PAD ELECT DEFIB RADIOL ZOLL (MISCELLANEOUS) ×3 IMPLANT
PENCIL BUTTON HOLSTER BLD 10FT (ELECTRODE) ×3 IMPLANT
PUNCH AORTIC ROTATE  4.5MM 8IN (MISCELLANEOUS) ×3 IMPLANT
PUNCH AORTIC ROTATE 4.0MM (MISCELLANEOUS) IMPLANT
PUNCH AORTIC ROTATE 4.5MM 8IN (MISCELLANEOUS) IMPLANT
PUNCH AORTIC ROTATE 5MM 8IN (MISCELLANEOUS) IMPLANT
SET CARDIOPLEGIA MPS 5001102 (MISCELLANEOUS) ×3 IMPLANT
SUT BONE WAX W31G (SUTURE) ×3 IMPLANT
SUT MNCRL AB 4-0 PS2 18 (SUTURE) IMPLANT
SUT PROLENE 3 0 SH DA (SUTURE) ×3 IMPLANT
SUT PROLENE 4 0 RB 1 (SUTURE) ×1
SUT PROLENE 4 0 SH DA (SUTURE) IMPLANT
SUT PROLENE 4-0 RB1 .5 CRCL 36 (SUTURE) ×2 IMPLANT
SUT PROLENE 5 0 C 1 36 (SUTURE) ×3 IMPLANT
SUT PROLENE 6 0 C 1 30 (SUTURE) ×15 IMPLANT
SUT PROLENE 7 0 BV1 MDA (SUTURE) ×9 IMPLANT
SUT PROLENE 8 0 BV175 6 (SUTURE) IMPLANT
SUT STEEL 6MS V (SUTURE) ×3 IMPLANT
SUT STEEL STERNAL CCS#1 18IN (SUTURE) IMPLANT
SUT STEEL SZ 6 DBL 3X14 BALL (SUTURE) ×3 IMPLANT
SUT VIC AB 1 CTX 36 (SUTURE) ×2
SUT VIC AB 1 CTX36XBRD ANBCTR (SUTURE) ×4 IMPLANT
SUT VIC AB 2-0 CT1 27 (SUTURE) ×1
SUT VIC AB 2-0 CT1 TAPERPNT 27 (SUTURE) ×2 IMPLANT
SUT VIC AB 2-0 CTX 27 (SUTURE) IMPLANT
SUT VIC AB 3-0 SH 27 (SUTURE)
SUT VIC AB 3-0 SH 27X BRD (SUTURE) IMPLANT
SUT VIC AB 3-0 X1 27 (SUTURE) ×3 IMPLANT
SUT VICRYL 4-0 PS2 18IN ABS (SUTURE) IMPLANT
SUTURE E-PAK OPEN HEART (SUTURE) ×3 IMPLANT
SYSTEM SAHARA CHEST DRAIN ATS (WOUND CARE) ×3 IMPLANT
TAPE CLOTH SURG 4X10 WHT LF (GAUZE/BANDAGES/DRESSINGS) ×3 IMPLANT
TAPE PAPER 2X10 WHT MICROPORE (GAUZE/BANDAGES/DRESSINGS) ×3 IMPLANT
TOWEL GREEN STERILE (TOWEL DISPOSABLE) ×3 IMPLANT
TOWEL GREEN STERILE FF (TOWEL DISPOSABLE) IMPLANT
TOWEL OR 17X24 6PK STRL BLUE (TOWEL DISPOSABLE) IMPLANT
TOWEL OR 17X26 10 PK STRL BLUE (TOWEL DISPOSABLE) IMPLANT
TRAY FOLEY SILVER 16FR TEMP (SET/KITS/TRAYS/PACK) ×3 IMPLANT
TUBE FEEDING 8FR 16IN STR KANG (MISCELLANEOUS) ×3 IMPLANT
TUBE SUCT INTRACARD DLP 20F (MISCELLANEOUS) ×3 IMPLANT
TUBING INSUFFLATION (TUBING) ×3 IMPLANT
UNDERPAD 30X30 (UNDERPADS AND DIAPERS) ×3 IMPLANT
WATER STERILE IRR 1000ML POUR (IV SOLUTION) ×6 IMPLANT

## 2017-01-28 NOTE — Anesthesia Procedure Notes (Signed)
Procedure Name: Intubation Date/Time: 01/28/2017 8:01 AM Performed by: Rejeana Brock L Pre-anesthesia Checklist: Patient identified, Emergency Drugs available, Suction available and Patient being monitored Patient Re-evaluated:Patient Re-evaluated prior to induction Oxygen Delivery Method: Circle System Utilized Preoxygenation: Pre-oxygenation with 100% oxygen Induction Type: IV induction Ventilation: Mask ventilation without difficulty Laryngoscope Size: Mac and 4 Grade View: Grade I Tube type: Oral Tube size: 8.0 mm Number of attempts: 1 Airway Equipment and Method: Stylet and Oral airway Placement Confirmation: ETT inserted through vocal cords under direct vision,  positive ETCO2 and breath sounds checked- equal and bilateral Secured at: 22 cm Tube secured with: Tape Dental Injury: Teeth and Oropharynx as per pre-operative assessment

## 2017-01-28 NOTE — Plan of Care (Signed)
Problem: Respiratory: Goal: Ability to tolerate decreased levels of ventilator support will improve Outcome: Completed/Met Date Met: 01/28/17 Pt extubated to 4L [place]. O2 sats > 96%.   

## 2017-01-28 NOTE — Procedures (Signed)
Extubation Procedure Note  Patient Details:   Name: Joshua Howell DOB: 1960-05-15 MRN: [id number]   Airway Documentation:  Airway 8 mm (Active)  Secured at (cm) 26 cm 01/28/2017  4:38 PM  Measured From Lips 01/28/2017  4:38 PM  Secured Location Center 01/28/2017  4:38 PM  Secured By [employer] 01/28/2017  4:38 PM  Tube Holder Repositioned Yes 01/28/2017  4:38 PM  Cuff Pressure (cm H2O) 30 cm H2O 01/28/2017  1:28 PM  Site Condition Cool;Dry 01/28/2017  4:38 PM    Evaluation  O2 sats: stable throughout Complications: No apparent complications Patient did tolerate procedure well. Bilateral Breath Sounds: Clear, Diminished   Yes   Patient extubated to 4L [place]. Patient achieved NIF of -36 and VC of 1.2 liters. Cuff leak was noted prior to extubation. No complications were noted. Patient is able to speak and is oriented to time and place. Vitals are stable and sats are 94%. RT will continue to monitor.   Shanin Szymanowski Lajuana Ripple 01/28/2017, 6:29 PM

## 2017-01-28 NOTE — Progress Notes (Signed)
Patient ID: Joshua Howell, male   DOB: 13-May-1961, 56 y.o.   MRN: [id number] EVENING ROUNDS NOTE :     [address]       [place] [address]             [phone]                 Day of Surgery Procedure(s) (LRB): CORONARY ARTERY BYPASS GRAFTING (CABG ) x 4 USING LEFT INTERNAL MAMMARY ARTERY AND ENDOSCOPIC HARVESTING OF RIGHT SAPHENOUS VEIN,  LIMA-LAD SVG-RAMUS SEQ SVG-PD-PL (N/A) TRANSESOPHAGEAL ECHOCARDIOGRAM (TEE) (N/A)  Total Length of Stay:  LOS: 5 days  BP 99/70   Pulse 80   Temp 97.9 F (36.6 C)   Resp 17   Ht 6' (1.829 m)   Wt 281 lb 14.4 oz (127.9 kg)   SpO2 96%   BMI 38.23 kg/m   .Intake/Output      09/16 0701 - 09/17 0700 09/17 0701 - 09/18 0700   P.O. 720    I.V. (mL/kg)  2118 (16.6)   Blood  575   IV Piggyback  450   Total Intake(mL/kg) 720 (5.6) 3143 (24.6)   Urine (mL/kg/hr) 1500 (0.5) 2605 (1.8)   Emesis/NG output  120   Blood  1215   Chest Tube  70   Total Output 1500 4010   Net -780 -867.1          . sodium chloride 10 mL/hr at 01/28/17 1700  . [START ON 01/29/2017] sodium chloride    . sodium chloride 20 mL/hr at 01/28/17 1700  . albumin human    . cefUROXime (ZINACEF)  IV Stopped (01/28/17 1535)  . dexmedetomidine (PRECEDEX) IV infusion Stopped (01/28/17 1744)  . famotidine (PEPCID) IV Stopped (01/28/17 1430)  . insulin (NOVOLIN-R) infusion 3.6 Units/hr (01/28/17 1812)  . lactated ringers    . lactated ringers    . lactated ringers    . magnesium sulfate 4 g (01/28/17 1400)  . nitroGLYCERIN Stopped (01/28/17 1400)  . phenylephrine (NEO-SYNEPHRINE) Adult infusion 30 mcg/min (01/28/17 1700)  . vancomycin       Lab Results  Component Value Date   WBC 16.2 (H) 01/28/2017   HGB 14.6 01/28/2017   HCT 43.0 01/28/2017   PLT 135 (L) 01/28/2017   GLUCOSE 153 (H) 01/28/2017   CHOL 162 01/24/2017   TRIG 130 01/24/2017   HDL 43 01/24/2017   LDLCALC 93 01/24/2017   ALT 22 01/26/2017   AST 23 01/26/2017   NA 139  01/28/2017   K 4.2 01/28/2017   CL 102 01/28/2017   CREATININE 0.80 01/28/2017   BUN 20 01/28/2017   CO2 23 01/26/2017   TSH 3.112 01/24/2017   INR 1.26 01/28/2017   HGBA1C 7.0 (H) 01/24/2017   Patient awake and alert, follows commands, still on vent weaning, extubated soon Not bleeding   Delight Ovens MD  Beeper [phone] Office [phone] 01/28/2017 6:15 PM

## 2017-01-28 NOTE — Anesthesia Procedure Notes (Signed)
Arterial Line Insertion Start/End9/17/2018 7:00 AM, 01/28/2017 7:10 AM Performed by: Epifanio Lesches, CRNA  Patient location: Pre-op. Preanesthetic checklist: patient identified, IV checked, site marked, risks and benefits discussed, surgical consent, monitors and equipment checked, pre-op evaluation, timeout performed and anesthesia consent Lidocaine 1% used for infiltration and patient sedated Left, radial was placed Catheter size: 20 G Hand hygiene performed  and maximum sterile barriers used  Allen's test indicative of satisfactory collateral circulation Attempts: 1 Procedure performed without using ultrasound guided technique. Ultrasound Notes:anatomy identified Following insertion, dressing applied and Biopatch. Post procedure assessment: normal  Patient tolerated the procedure well with no immediate complications.

## 2017-01-28 NOTE — Progress Notes (Signed)
Began weaning per rapid weaning protocol: RR changed to 4 and O2 changed to 40%.

## 2017-01-28 NOTE — Progress Notes (Signed)
This note also relates to the following rows which could not be included: SpO2 - Cannot attach notes to unvalidated device data  Placed patient on CPAP/PS per rapid weaning protocol.

## 2017-01-28 NOTE — Progress Notes (Signed)
9562- Pt transported to the OR. All belongings taken home by family. Report called to Anesthesia.

## 2017-01-28 NOTE — Brief Op Note (Addendum)
01/23/2017 - 01/28/2017  11:48 AM  PATIENT:  Joshua Howell  56 y.o. male  PRE-OPERATIVE DIAGNOSIS:  CAD  POST-OPERATIVE DIAGNOSIS:  CAD  PROCEDURE:  Procedure(s): CORONARY ARTERY BYPASS GRAFTING (CABG ) x 4 USING LEFT INTERNAL MAMMARY ARTERY AND ENDOSCOPIC HARVESTING OF RIGHT SAPHENOUS VEIN,  LIMA-LAD SVG-RAMUS SEQ SVG-PD-PL (N/A) TRANSESOPHAGEAL ECHOCARDIOGRAM (TEE) (N/A) LIMA-LAD SVG-RAMUS SEQ SVG-PD-PL   SURGEON:  Surgeon(s) and Role:    * [name], MD - Primary  PHYSICIAN ASSISTANT: WAYNE GOLD PA-C  ANESTHESIA:   general  EBL:  Total I/O In: 2625 [I.V.:1800; Blood:575; IV Piggyback:250] Out: 2740 [Urine:1425; Emesis/NG output:100; Blood:1215]  BLOOD ADMINISTERED:none  DRAINS: ROUTINE   LOCAL MEDICATIONS USED:  NONE  SPECIMEN:  No Specimen  DISPOSITION OF SPECIMEN:  N/A  COUNTS:  YES  TOURNIQUET:  * No tourniquets in log *  DICTATION: .Other Dictation: Dictation Number PENDING  PLAN OF CARE: Admit to inpatient   PATIENT DISPOSITION:  ICU - intubated and hemodynamically stable.   Delay start of Pharmacological VTE agent (>24hrs) due to surgical blood loss or risk of bleeding: yes  COMPLICATONS: NO KNOWN  XC= 76 min CPB= 120 min

## 2017-01-28 NOTE — Anesthesia Procedure Notes (Signed)
Central Venous Catheter Insertion Performed by: Val Eagle, anesthesiologist Start/End9/17/2018 6:49 AM, 01/28/2017 6:59 AM Patient location: Pre-op. Preanesthetic checklist: patient identified, IV checked, site marked, risks and benefits discussed, surgical consent, monitors and equipment checked, pre-op evaluation, timeout performed and anesthesia consent Lidocaine 1% used for infiltration and patient sedated Hand hygiene performed  and maximum sterile barriers used  Catheter size: 8.5 Fr Total catheter length 10. Sheath introducer Procedure performed using ultrasound guided technique. Ultrasound Notes:anatomy identified, needle tip was noted to be adjacent to the nerve/plexus identified, no ultrasound evidence of intravascular and/or intraneural injection and image(s) printed for medical record Attempts: 1 Following insertion, line sutured and dressing applied. Post procedure assessment: blood return through all ports, free fluid flow and no air  Patient tolerated the procedure well with no immediate complications.

## 2017-01-28 NOTE — Transfer of Care (Signed)
Immediate Anesthesia Transfer of Care Note  Patient: Joshua Howell  Procedure(s) Performed: Procedure(s): CORONARY ARTERY BYPASS GRAFTING (CABG ) x 4 USING LEFT INTERNAL MAMMARY ARTERY AND ENDOSCOPIC HARVESTING OF RIGHT SAPHENOUS VEIN,  LIMA-LAD SVG-RAMUS SEQ SVG-PD-PL (N/A) TRANSESOPHAGEAL ECHOCARDIOGRAM (TEE) (N/A)  Patient Location: PACU  Anesthesia Type:General  Level of Consciousness: Patient remains intubated per anesthesia plan  Airway & Oxygen Therapy: Patient remains intubated per anesthesia plan and Patient placed on Ventilator (see vital sign flow sheet for setting)  Post-op Assessment: Report given to RN and Post -op Vital signs reviewed and stable  Post vital signs: Reviewed and stable  Last Vitals:  Vitals:   01/28/17 1327 01/28/17 1328  BP: 131/90   Pulse: 94   Resp:    Temp:    SpO2: 97% 97%    Last Pain:  Vitals:   01/28/17 0322  TempSrc: Oral  PainSc:          Complications: No apparent anesthesia complications

## 2017-01-28 NOTE — H&P (View-Only) (Signed)
Reason for Consult:3 vessel CAD Referring Physician: Dr. Clyda Greener is an 56 y.o. male.  HPI: 56 yo man with a history of morbid obesity, hypertension, type II diabetes without complication, and a family history of CAD who presented yesterday after a prolonged episode of CP 4 days ago. He has no prior history of CAD. On Monday while driving to work he developed a substernal CP. He thought it was indigestion, but more severe than he usually experiences. He had some relief with belching. He c/o feeling fatigued but the pain did not radiate and he did not have N/V or diaphoresis.Lasted several hours before resolving spontaneously.  He went to his primary on Thursday. An ECG was abnormal and he was sent to [facility]. He ruled in for an OOH MI with a troponin of 3.02. He has not had any additional pain since Monday. Today he unedrwent cardiac catheterization which revealed severe 3 vessel CAD.  Past Medical History:  Diagnosis Date  . Elevated blood-pressure reading without diagnosis of hypertension   . Hepatitis B   . Impaired fasting blood sugar   . Migraine    "only in [employer]" (01/23/2017)  . Morbid obesity ([place])   . NSTEMI (non-ST elevated myocardial infarction) ([place]) 01/23/2017  . Type I diabetes mellitus ([place]) 07/2016    Past Surgical History:  Procedure Laterality Date  . ANTERIOR CERVICAL DECOMP/DISCECTOMY FUSION     "took a piece off my right hip hip & put it in there"  . BACK SURGERY    . LEFT HEART CATH AND CORONARY ANGIOGRAPHY N/A 01/24/2017   Procedure: LEFT HEART CATH AND CORONARY ANGIOGRAPHY;  Surgeon: Jettie Booze, MD;  Location: [place];  Service: Cardiovascular;  Laterality: N/A;    Family History  Problem Relation Age of Onset  . Gallbladder disease Mother   . Aneurysm Mother        brain  . Stroke Mother   . Heart disease Father 8       3 vessel ABG  . Heart disease Brother 37       MI  . Other Brother        died of  hemorrhage in head after fall    Social History:  reports that he has quit smoking. His smoking use included Cigarettes. He has never used smokeless tobacco. He reports that he drinks alcohol. He reports that he does not use drugs.  Allergies: No Known Allergies  Medications:  Prior to Admission:  Prescriptions Prior to Admission  Medication Sig Dispense Refill Last Dose  . DiphenhydrAMINE HCl (BENADRYL ALLERGY PO) Take 25 mg by mouth as needed (allergies).    prn  . metFORMIN (GLUCOPHAGE) 500 MG tablet Take 1 tablet (500 mg total) by mouth 2 (two) times daily with a meal. 180 tablet 2 01/23/2017 at Unknown time    Results for orders placed or performed during the hospital encounter of 01/23/17 (from the past 48 hour(s))  Basic metabolic panel     Status: Abnormal   Collection Time: 01/23/17  4:51 PM  Result Value Ref Range   Sodium 135 135 - 145 mmol/L   Potassium 4.4 3.5 - 5.1 mmol/L   Chloride 102 101 - 111 mmol/L   CO2 25 22 - 32 mmol/L   Glucose, Bld 172 (H) 65 - 99 mg/dL   BUN 15 6 - 20 mg/dL   Creatinine, Ser 1.21 0.61 - 1.24 mg/dL   Calcium 9.3 8.9 - 10.3  mg/dL   GFR calc non Af Amer >60 >60 mL/min   GFR calc Af Amer >60 >60 mL/min    Comment: (NOTE) The eGFR has been calculated using the CKD EPI equation. This calculation has not been validated in all clinical situations. eGFR's persistently <60 mL/min signify possible Chronic Kidney Disease.    Anion gap 8 5 - 15  CBC     Status: None   Collection Time: 01/23/17  4:51 PM  Result Value Ref Range   WBC 8.6 4.0 - 10.5 K/uL   RBC 5.12 4.22 - 5.81 MIL/uL   Hemoglobin 16.6 13.0 - 17.0 g/dL   HCT 49.0 39.0 - 52.0 %   MCV 95.7 78.0 - 100.0 fL   MCH 32.4 26.0 - 34.0 pg   MCHC 33.9 30.0 - 36.0 g/dL   RDW 12.9 11.5 - 15.5 %   Platelets 188 150 - 400 K/uL  I-stat troponin, ED     Status: Abnormal   Collection Time: 01/23/17  4:57 PM  Result Value Ref Range   Troponin i, poc 1.61 (HH) 0.00 - 0.08 ng/mL   Comment  NOTIFIED PHYSICIAN    Comment 3            Comment: Due to the release kinetics of cTnI, a negative result within the first hours of the onset of symptoms does not rule out myocardial infarction with certainty. If myocardial infarction is still suspected, repeat the test at appropriate intervals.   MRSA PCR Screening     Status: None   Collection Time: 01/23/17 10:33 PM  Result Value Ref Range   MRSA by PCR NEGATIVE NEGATIVE    Comment:        The GeneXpert MRSA Assay (FDA approved for NASAL specimens only), is one component of a comprehensive MRSA colonization surveillance program. It is not intended to diagnose MRSA infection nor to guide or monitor treatment for MRSA infections.   Glucose, capillary     Status: Abnormal   Collection Time: 01/23/17 11:06 PM  Result Value Ref Range   Glucose-Capillary 193 (H) 65 - 99 mg/dL   Comment 1 Notify RN    Comment 2 Document in Chart   Heparin level (unfractionated)     Status: Abnormal   Collection Time: 01/24/17  1:16 AM  Result Value Ref Range   Heparin Unfractionated 0.12 (L) 0.30 - 0.70 IU/mL    Comment:        IF HEPARIN RESULTS ARE BELOW EXPECTED VALUES, AND PATIENT DOSAGE HAS BEEN CONFIRMED, SUGGEST FOLLOW UP TESTING OF ANTITHROMBIN III LEVELS.   CBC     Status: None   Collection Time: 01/24/17  1:16 AM  Result Value Ref Range   WBC 8.2 4.0 - 10.5 K/uL   RBC 4.77 4.22 - 5.81 MIL/uL   Hemoglobin 15.4 13.0 - 17.0 g/dL   HCT 45.6 39.0 - 52.0 %   MCV 95.6 78.0 - 100.0 fL   MCH 32.3 26.0 - 34.0 pg   MCHC 33.8 30.0 - 36.0 g/dL   RDW 12.8 11.5 - 15.5 %   Platelets 164 150 - 400 K/uL  HIV antibody (Routine Testing)     Status: None   Collection Time: 01/24/17  1:16 AM  Result Value Ref Range   HIV Screen 4th Generation wRfx Non Reactive Non Reactive    Comment: (NOTE) Performed At: [facility] [address] [place], [place] [id number] Lindon Romp MD PP:[id number]   Hemoglobin A1c     Status:  Abnormal   Collection Time: 01/24/17  1:16 AM  Result Value Ref Range   Hgb A1c MFr Bld 7.0 (H) 4.8 - 5.6 %    Comment: (NOTE) Pre diabetes:          5.7%-6.4% Diabetes:              >6.4% Glycemic control for   <7.0% adults with diabetes    Mean Plasma Glucose 154.2 mg/dL  TSH     Status: None   Collection Time: 01/24/17  1:16 AM  Result Value Ref Range   TSH 3.112 0.350 - 4.500 uIU/mL    Comment: Performed by a 3rd Generation assay with a functional sensitivity of <=0.01 uIU/mL.  T4, free     Status: None   Collection Time: 01/24/17  1:16 AM  Result Value Ref Range   Free T4 0.85 0.61 - 1.12 ng/dL    Comment: (NOTE) Biotin ingestion may interfere with free T4 tests. If the results are inconsistent with the TSH level, previous test results, or the clinical presentation, then consider biotin interference. If needed, order repeat testing after stopping biotin.   Glucose, capillary     Status: Abnormal   Collection Time: 01/24/17  6:08 AM  Result Value Ref Range   Glucose-Capillary 151 (H) 65 - 99 mg/dL   Comment 1 Notify RN    Comment 2 Document in Chart   Troponin I     Status: Abnormal   Collection Time: 01/24/17  6:40 AM  Result Value Ref Range   Troponin I 3.02 (HH) <0.03 ng/mL    Comment: CRITICAL RESULT CALLED TO, READ BACK BY AND VERIFIED WITH: J.Carney Corners [id number] 01/24/17 CLARK,S   Lipid panel     Status: None   Collection Time: 01/24/17  6:40 AM  Result Value Ref Range   Cholesterol 162 0 - 200 mg/dL   Triglycerides 130 <150 mg/dL   HDL 43 >40 mg/dL   Total CHOL/HDL Ratio 3.8 RATIO   VLDL 26 0 - 40 mg/dL   LDL Cholesterol 93 0 - 99 mg/dL    Comment:        Total Cholesterol/HDL:CHD Risk Coronary Heart Disease Risk Table                     Men   Women  1/2 Average Risk   3.4   3.3  Average Risk       5.0   4.4  2 X Average Risk   9.6   7.1  3 X Average Risk  23.4   11.0        Use the calculated Patient Ratio above and the CHD Risk Table to determine  the patient's CHD Risk.        ATP III CLASSIFICATION (LDL):  <100     mg/dL   Optimal  100-129  mg/dL   Near or Above                    Optimal  130-159  mg/dL   Borderline  160-189  mg/dL   High  >190     mg/dL   Very High   Hepatic function panel     Status: Abnormal   Collection Time: 01/24/17  6:40 AM  Result Value Ref Range   Total Protein 7.2 6.5 - 8.1 g/dL   Albumin 3.7 3.5 - 5.0 g/dL   AST 33 15 - 41 U/L   ALT 24 17 - 63 U/L  Alkaline Phosphatase 49 38 - 126 U/L   Total Bilirubin 2.2 (H) 0.3 - 1.2 mg/dL   Bilirubin, Direct 0.3 0.1 - 0.5 mg/dL   Indirect Bilirubin 1.9 (H) 0.3 - 0.9 mg/dL  Glucose, capillary     Status: Abnormal   Collection Time: 01/24/17 12:36 PM  Result Value Ref Range   Glucose-Capillary 132 (H) 65 - 99 mg/dL   Comment 1 Notify RN    Comment 2 Document in Chart   Troponin I     Status: Abnormal   Collection Time: 01/24/17 12:48 PM  Result Value Ref Range   Troponin I 2.54 (HH) <0.03 ng/mL    Comment: CRITICAL VALUE NOTED.  VALUE IS CONSISTENT WITH PREVIOUSLY REPORTED AND CALLED VALUE.  Protime-INR     Status: None   Collection Time: 01/24/17 12:48 PM  Result Value Ref Range   Prothrombin Time 13.2 11.4 - 15.2 seconds   INR 1.01     Dg Chest 2 View  Result Date: 01/23/2017 CLINICAL DATA:  56 year old male with abnormal EKG. Epigastric pain, nausea and shortness of breath. EXAM: CHEST  2 VIEW COMPARISON:  None. FINDINGS: Lung volumes are at the upper limits of normal to mildly hyperinflated with some attenuation of upper lobe bronchovascular markings. Cardiac size and mediastinal contours are within normal limits. Visualized tracheal air column is within normal limits. No pneumothorax, pulmonary edema, pleural effusion or confluent pulmonary opacity. Mildly increased interstitial markings in both lungs. Osteopenia. No acute osseous abnormality identified. Negative visible bowel gas pattern. IMPRESSION: 1.  No acute cardiopulmonary abnormality. 2.  Borderline to mild pulmonary hyperinflation with questionable upper lobe emphysema. Electronically Signed   By: Genevie Ann M.D.   On: 01/23/2017 17:11    Review of Systems  Constitutional: Positive for malaise/fatigue. Negative for diaphoresis.  Eyes: Negative for blurred vision and double vision.  Cardiovascular: Positive for chest pain and leg swelling. Negative for claudication.  Gastrointestinal: Positive for heartburn. Negative for nausea and vomiting.  Genitourinary: Negative for dysuria and frequency.  Neurological: Negative for dizziness, focal weakness, loss of consciousness and weakness.  Endo/Heme/Allergies: Positive for polydipsia. Does not bruise/bleed easily.  All other systems reviewed and are negative.  Blood pressure 109/65, pulse 63, temperature 98.6 F (37 C), temperature source Oral, resp. rate 17, height 6' (1.829 m), weight 282 lb 13.6 oz (128.3 kg), SpO2 95 %. Physical Exam  Vitals reviewed. Constitutional: He is oriented to person, place, and time. No distress.  obese  HENT:  Head: Normocephalic and atraumatic.  Mouth/Throat: No oropharyngeal exudate.  Eyes: Conjunctivae and EOM are normal. No scleral icterus.  Neck: Neck supple. No thyromegaly present.  No bruit  Cardiovascular: Normal rate, regular rhythm, normal heart sounds and intact distal pulses.  Exam reveals no gallop and no friction rub.   No murmur heard. Respiratory: Effort normal and breath sounds normal. No respiratory distress. He has no wheezes. He has no rales.  GI: Soft. He exhibits no distension. There is no tenderness.  Musculoskeletal: He exhibits no edema.  Lymphadenopathy:    He has no cervical adenopathy.  Neurological: He is alert and oriented to person, place, and time. No cranial nerve deficit.  Motor intact  Skin: Skin is warm and dry.   ECHOCARDIOGRAM Study Conclusions  - Left ventricle: The cavity size was normal. Systolic function was   normal. The estimated ejection  fraction was in the range of 60%   to 65%. Wall motion was normal; there were no regional wall  motion abnormalities. - Right atrium: The atrium was mildly dilated.  CARDIAC CATHETERIZATION Conclusion     Mid RCA lesion, 80 %stenosed. Proximal vessel is ectatic.  Post Atrio lesion, 100 %stenosed.  Ramus lesion, 95 %stenosed.  Ost LAD to Prox LAD lesion, 90 %stenosed.  The left ventricular systolic function is normal.  LV end diastolic pressure is normal.  The left ventricular ejection fraction is 55-65% by visual estimate.  There is no aortic valve stenosis.   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  Plan for CVTS consult.  Restart heparin 6 hours post sheath pull.    I personally reviewed the cath images and concur with the findings noted above.  Assessment/Plan: 56 yo man with multiple CRF but no prior history of CAD presents late after an out of hospital MI. He was found to have severe 3 vessel CAD with preserved LV function. CABG is indicated for survival benefit and relief of symptoms.  I discussed the general nature of the procedure, the need for general anesthesia, the use of cardiopulmonary bypass, and the incisions to be used with the patient and his children. We discussed the expected hospital stay, overall recovery and short and long term outcomes. I informed them of the indications, risks, benefits and alternatives. They understand the risks include, but are not limited to death, stroke, MI, DVT/PE, bleeding, possible need for transfusion, infections, cardiac arrhythmias, as well as other organ system dysfunction including respiratory, renal, or GI complications.   He accepts the risks and agrees to proceed.  Plan CABG on Monday 9/17  Melrose Nakayama 01/24/2017, 4:01 PM

## 2017-01-28 NOTE — Progress Notes (Signed)
  Echocardiogram Echocardiogram Transesophageal has been performed.  Joshua Howell 01/28/2017, 8:58 AM

## 2017-01-28 NOTE — Interval H&P Note (Signed)
History and Physical Interval Note:  01/28/2017 7:27 AM  Joshua Howell  has presented today for surgery, with the diagnosis of CAD  The various methods of treatment have been discussed with the patient and family. After consideration of risks, benefits and other options for treatment, the patient has consented to  Procedure(s): CORONARY ARTERY BYPASS GRAFTING (CABG) (N/A) TRANSESOPHAGEAL ECHOCARDIOGRAM (TEE) (N/A) as a surgical intervention .  The patient's history has been reviewed, patient examined, no change in status, stable for surgery.  I have reviewed the patient's chart and labs.  Questions were answered to the patient's satisfaction.     [name]

## 2017-01-28 NOTE — Anesthesia Procedure Notes (Signed)
Central Venous Catheter Insertion Performed by: Val Eagle, anesthesiologist Start/End9/17/2018 6:49 AM, 01/28/2017 6:59 AM Patient location: Pre-op. Preanesthetic checklist: patient identified, IV checked, site marked, risks and benefits discussed, surgical consent, monitors and equipment checked, pre-op evaluation, timeout performed and anesthesia consent Hand hygiene performed  and maximum sterile barriers used  PA cath was placed.Swan type:thermodilution Procedure performed without using ultrasound guided technique. Attempts: 1 Following insertion, line sutured, dressing applied and Biopatch. Patient tolerated the procedure well with no immediate complications.

## 2017-01-29 ENCOUNTER — Encounter (HOSPITAL_COMMUNITY): Payer: Self-pay | Admitting: Thoracic Surgery (Cardiothoracic Vascular Surgery)

## 2017-01-29 ENCOUNTER — Inpatient Hospital Stay (HOSPITAL_COMMUNITY): Payer: BLUE CROSS/BLUE SHIELD

## 2017-01-29 DIAGNOSIS — Z951 Presence of aortocoronary bypass graft: Secondary | ICD-10-CM

## 2017-01-29 LAB — GLUCOSE, CAPILLARY
GLUCOSE-CAPILLARY: 110 mg/dL — AB (ref 65–99)
GLUCOSE-CAPILLARY: 119 mg/dL — AB (ref 65–99)
GLUCOSE-CAPILLARY: 128 mg/dL — AB (ref 65–99)
GLUCOSE-CAPILLARY: 120 mg/dL — AB (ref 65–99)
GLUCOSE-CAPILLARY: 147 mg/dL — AB (ref 65–99)
GLUCOSE-CAPILLARY: 133 mg/dL — AB (ref 65–99)
GLUCOSE-CAPILLARY: 125 mg/dL — AB (ref 65–99)
GLUCOSE-CAPILLARY: 182 mg/dL — AB (ref 65–99)
Glucose-Capillary: 113 mg/dL — ABNORMAL HIGH (ref 65–99)
Glucose-Capillary: 116 mg/dL — ABNORMAL HIGH (ref 65–99)
Glucose-Capillary: 118 mg/dL — ABNORMAL HIGH (ref 65–99)
Glucose-Capillary: 124 mg/dL — ABNORMAL HIGH (ref 65–99)
Glucose-Capillary: 124 mg/dL — ABNORMAL HIGH (ref 65–99)
Glucose-Capillary: 129 mg/dL — ABNORMAL HIGH (ref 65–99)
Glucose-Capillary: 165 mg/dL — ABNORMAL HIGH (ref 65–99)
Glucose-Capillary: 193 mg/dL — ABNORMAL HIGH (ref 65–99)
Glucose-Capillary: 96 mg/dL (ref 65–99)

## 2017-01-29 LAB — POCT I-STAT, CHEM 8
BUN: 14 mg/dL (ref 6–20)
CHLORIDE: 97 mmol/L — AB (ref 101–111)
CREATININE: 1 mg/dL (ref 0.61–1.24)
Calcium, Ion: 1.15 mmol/L (ref 1.15–1.40)
GLUCOSE: 175 mg/dL — AB (ref 65–99)
HCT: 44 % (ref 39.0–52.0)
Hemoglobin: 15 g/dL (ref 13.0–17.0)
POTASSIUM: 4.6 mmol/L (ref 3.5–5.1)
Sodium: 134 mmol/L — ABNORMAL LOW (ref 135–145)
TCO2: 24 mmol/L (ref 22–32)

## 2017-01-29 LAB — CBC
HCT: 43.7 % (ref 39.0–52.0)
HEMATOCRIT: 43.8 % (ref 39.0–52.0)
HEMOGLOBIN: 14.9 g/dL (ref 13.0–17.0)
HEMOGLOBIN: 14.5 g/dL (ref 13.0–17.0)
MCH: 32.5 pg (ref 26.0–34.0)
MCH: 32.4 pg (ref 26.0–34.0)
MCHC: 34.1 g/dL (ref 30.0–36.0)
MCHC: 33.1 g/dL (ref 30.0–36.0)
MCV: 95.2 fL (ref 78.0–100.0)
MCV: 97.8 fL (ref 78.0–100.0)
PLATELETS: 125 10*3/uL — AB (ref 150–400)
Platelets: 150 10*3/uL (ref 150–400)
RBC: 4.59 MIL/uL (ref 4.22–5.81)
RBC: 4.48 MIL/uL (ref 4.22–5.81)
RDW: 13.3 % (ref 11.5–15.5)
RDW: 13.5 % (ref 11.5–15.5)
WBC: 14.8 10*3/uL — ABNORMAL HIGH (ref 4.0–10.5)
WBC: 15.4 10*3/uL — AB (ref 4.0–10.5)

## 2017-01-29 LAB — BASIC METABOLIC PANEL
Anion gap: 10 (ref 5–15)
BUN: 12 mg/dL (ref 6–20)
CHLORIDE: 104 mmol/L (ref 101–111)
CO2: 19 mmol/L — AB (ref 22–32)
Calcium: 7.9 mg/dL — ABNORMAL LOW (ref 8.9–10.3)
Creatinine, Ser: 0.97 mg/dL (ref 0.61–1.24)
GFR calc Af Amer: >60 mL/min (ref >60)
GFR calc non Af Amer: >60 mL/min (ref >60)
Glucose, Bld: 117 mg/dL — ABNORMAL HIGH (ref 65–99)
Potassium: 4.1 mmol/L (ref 3.5–5.1)
Sodium: 133 mmol/L — ABNORMAL LOW (ref 135–145)

## 2017-01-29 LAB — MAGNESIUM
Magnesium: 2.2 mg/dL (ref 1.7–2.4)
Magnesium: 2.1 mg/dL (ref 1.7–2.4)

## 2017-01-29 MED ORDER — INSULIN ASPART 100 UNIT/ML ~~LOC~~ SOLN
0.0000 [IU] | SUBCUTANEOUS | Status: DC
  Administered 2017-01-29: 4 [IU] via SUBCUTANEOUS
  Administered 2017-01-29: 2 [IU] via SUBCUTANEOUS
  Administered 2017-01-29: 4 [IU] via SUBCUTANEOUS
  Administered 2017-01-30: 2 [IU] via SUBCUTANEOUS
  Administered 2017-01-30: 4 [IU] via SUBCUTANEOUS

## 2017-01-29 MED ORDER — ENOXAPARIN SODIUM 40 MG/0.4ML ~~LOC~~ SOLN
40.0000 mg | Freq: Every day | SUBCUTANEOUS | Status: DC
  Administered 2017-01-29 – 2017-01-31 (×3): 40 mg via SUBCUTANEOUS
  Filled 2017-01-29 (×3): qty 0.4

## 2017-01-29 MED ORDER — INSULIN DETEMIR 100 UNIT/ML ~~LOC~~ SOLN: 10.0000 [IU] | Freq: Two times a day (BID) | SUBCUTANEOUS | Status: DC

## 2017-01-29 MED ORDER — INSULIN DETEMIR 100 UNIT/ML ~~LOC~~ SOLN
10.0000 [IU] | Freq: Two times a day (BID) | SUBCUTANEOUS | Status: DC
  Administered 2017-01-29 – 2017-01-30 (×3): 10 [IU] via SUBCUTANEOUS
  Filled 2017-01-29 (×5): qty 0.1

## 2017-01-29 NOTE — Progress Notes (Signed)
DAILY PROGRESS NOTE   Patient Name: Joshua Howell Date of Encounter: 01/29/2017  Hospital Problem List   Principal Problem:   NSTEMI (non-ST elevated myocardial infarction) [facility] Active Problems:   Diabetes mellitus with complication ([place])   Morbid obesity ([place])   Mixed dyslipidemia   S/P CABG x 4    Chief Complaint   Sore  Subjective   No events overnight - underwent CABG x 4 yesterday. Extubated yesterday. Doing very well. On low dose pressors and insulin gtts.  Objective   Vitals:   01/29/17 0630 01/29/17 0645 01/29/17 0700 01/29/17 0800  BP:   130/68 116/66  Pulse: 73 70 72 70  Resp: '15 20 16 14  '$ Temp: 98.2 F (36.8 C) 98.2 F (36.8 C) 98.2 F (36.8 C) 98.1 F (36.7 C)  TempSrc:      SpO2: 97% 98% 98% 98%  Weight:      Height:        Intake/Output Summary (Last 24 hours) at 01/29/17 0826 Last data filed at 01/29/17 0700  Gross per 24 hour  Intake          5024.91 ml  Output             5475 ml  Net          -450.09 ml   Filed Weights   01/26/17 0418 01/28/17 0300 01/29/17 0440  Weight: 271 lb 9.6 oz (123.2 kg) 281 lb 14.4 oz (127.9 kg) 280 lb 9.6 oz (127.3 kg)    Physical Exam   General appearance: alert and no distress Neck: no carotid bruit, no JVD and thyroid not enlarged, symmetric, no tenderness/mass/nodules Lungs: diminished breath sounds bibasilar Heart: regular rate and rhythm Abdomen: soft, non-tender; bowel sounds normal; no masses,  no organomegaly Extremities: extremities normal, atraumatic, no cyanosis or edema Pulses: 2+ and symmetric Skin: Skin color, texture, turgor normal. No rashes or lesions Neurologic: Grossly normal Psych: Pleasant  Inpatient Medications    Scheduled Meds: . acetaminophen  1,000 mg Oral Q6H   Or  . acetaminophen (TYLENOL) oral liquid 160 mg/5 mL  1,000 mg Per Tube Q6H  . aspirin EC  325 mg Oral Daily   Or  . aspirin  324 mg Per Tube Daily  . atorvastatin  80 mg Oral q1800  . bisacodyl  10 mg  Oral Daily   Or  . bisacodyl  10 mg Rectal Daily  . chlorhexidine  15 mL Mouth Rinse BID  . Chlorhexidine Gluconate Cloth  6 each Topical Daily  . docusate sodium  200 mg Oral Daily  . enoxaparin (LOVENOX) injection  40 mg Subcutaneous QHS  . insulin aspart  0-24 Units Subcutaneous Q4H  . insulin detemir  10 Units Subcutaneous BID  . insulin regular  0-10 Units Intravenous TID WC  . mouth rinse  15 mL Mouth Rinse q12n4p  . metoprolol tartrate  12.5 mg Oral BID   Or  . metoprolol tartrate  12.5 mg Per Tube BID  . [START ON 01/30/2017] pantoprazole  40 mg Oral Daily  . sodium chloride flush  10-40 mL Intracatheter Q12H  . sodium chloride flush  3 mL Intravenous Q12H    Continuous Infusions: . sodium chloride 10 mL/hr at 01/29/17 0700  . sodium chloride    . sodium chloride 20 mL/hr at 01/29/17 0700  . albumin human    . cefUROXime (ZINACEF)  IV Stopped (01/29/17 0342)  . dexmedetomidine (PRECEDEX) IV infusion Stopped (01/28/17 1744)  . famotidine (PEPCID)  IV Stopped (01/28/17 1430)  . insulin (NOVOLIN-R) infusion 0.6 Units/hr (01/29/17 0700)  . lactated ringers    . lactated ringers    . lactated ringers 20 mL/hr at 01/29/17 0700  . nitroGLYCERIN Stopped (01/28/17 1400)  . phenylephrine (NEO-SYNEPHRINE) Adult infusion 15 mcg/min (01/29/17 0700)    PRN Meds: sodium chloride, albumin human, lactated ringers, metoprolol tartrate, midazolam, morphine injection, ondansetron (ZOFRAN) IV, oxyCODONE, sodium chloride flush, sodium chloride flush, traMADol   Labs   Results for orders placed or performed during the hospital encounter of 01/23/17 (from the past 48 hour(s))  Glucose, capillary     Status: Abnormal   Collection Time: 01/27/17 12:11 PM  Result Value Ref Range   Glucose-Capillary 137 (H) 65 - 99 mg/dL  Heparin level (unfractionated)     Status: None   Collection Time: 01/28/17  2:21 AM  Result Value Ref Range   Heparin Unfractionated 0.34 0.30 - 0.70 IU/mL    Comment:         IF HEPARIN RESULTS ARE BELOW EXPECTED VALUES, AND PATIENT DOSAGE HAS BEEN CONFIRMED, SUGGEST FOLLOW UP TESTING OF ANTITHROMBIN III LEVELS.   CBC     Status: None   Collection Time: 01/28/17  2:21 AM  Result Value Ref Range   WBC 8.5 4.0 - 10.5 K/uL   RBC 4.82 4.22 - 5.81 MIL/uL   Hemoglobin 15.6 13.0 - 17.0 g/dL   HCT 46.1 39.0 - 52.0 %   MCV 95.6 78.0 - 100.0 fL   MCH 32.4 26.0 - 34.0 pg   MCHC 33.8 30.0 - 36.0 g/dL   RDW 13.2 11.5 - 15.5 %   Platelets 183 150 - 400 K/uL  Glucose, capillary     Status: Abnormal   Collection Time: 01/28/17  6:08 AM  Result Value Ref Range   Glucose-Capillary 145 (H) 65 - 99 mg/dL  I-STAT, chem 8     Status: Abnormal   Collection Time: 01/28/17  8:25 AM  Result Value Ref Range   Sodium 137 135 - 145 mmol/L   Potassium 5.4 (H) 3.5 - 5.1 mmol/L   Chloride 102 101 - 111 mmol/L   BUN 24 (H) 6 - 20 mg/dL   Creatinine, Ser 0.90 0.61 - 1.24 mg/dL   Glucose, Bld 147 (H) 65 - 99 mg/dL   Calcium, Ion 1.22 1.15 - 1.40 mmol/L   TCO2 28 22 - 32 mmol/L   Hemoglobin 16.0 13.0 - 17.0 g/dL   HCT 47.0 39.0 - 52.0 %  I-STAT, chem 8     Status: Abnormal   Collection Time: 01/28/17  9:51 AM  Result Value Ref Range   Sodium 137 135 - 145 mmol/L   Potassium 5.5 (H) 3.5 - 5.1 mmol/L   Chloride 105 101 - 111 mmol/L   BUN 22 (H) 6 - 20 mg/dL   Creatinine, Ser 0.90 0.61 - 1.24 mg/dL   Glucose, Bld 150 (H) 65 - 99 mg/dL   Calcium, Ion 1.16 1.15 - 1.40 mmol/L   TCO2 26 22 - 32 mmol/L   Hemoglobin 15.0 13.0 - 17.0 g/dL   HCT 44.0 39.0 - 52.0 %  I-STAT, chem 8     Status: Abnormal   Collection Time: 01/28/17 10:18 AM  Result Value Ref Range   Sodium 136 135 - 145 mmol/L   Potassium 5.2 (H) 3.5 - 5.1 mmol/L   Chloride 100 (L) 101 - 111 mmol/L   BUN 20 6 - 20 mg/dL   Creatinine, Ser 0.90 0.61 -  1.24 mg/dL   Glucose, Bld 141 (H) 65 - 99 mg/dL   Calcium, Ion 1.04 (L) 1.15 - 1.40 mmol/L   TCO2 25 22 - 32 mmol/L   Hemoglobin 12.2 (L) 13.0 - 17.0 g/dL    HCT 36.0 (L) 39.0 - 52.0 %  I-STAT 3, arterial blood gas (G3+)     Status: Abnormal   Collection Time: 01/28/17 10:22 AM  Result Value Ref Range   pH, Arterial 7.314 (L) 7.350 - 7.450   pCO2 arterial 49.6 (H) 32.0 - 48.0 mmHg   pO2, Arterial 289.0 (H) 83.0 - 108.0 mmHg   Bicarbonate 25.2 20.0 - 28.0 mmol/L   TCO2 27 22 - 32 mmol/L   O2 Saturation 100.0 %   Acid-base deficit 1.0 0.0 - 2.0 mmol/L   Patient temperature HIDE    Sample type ARTERIAL   I-STAT, chem 8     Status: Abnormal   Collection Time: 01/28/17 11:18 AM  Result Value Ref Range   Sodium 135 135 - 145 mmol/L   Potassium 6.3 (HH) 3.5 - 5.1 mmol/L   Chloride 103 101 - 111 mmol/L   BUN 21 (H) 6 - 20 mg/dL   Creatinine, Ser 0.90 0.61 - 1.24 mg/dL   Glucose, Bld 154 (H) 65 - 99 mg/dL   Calcium, Ion 1.01 (L) 1.15 - 1.40 mmol/L   TCO2 24 22 - 32 mmol/L   Hemoglobin 12.9 (L) 13.0 - 17.0 g/dL   HCT 38.0 (L) 39.0 - 52.0 %  Hemoglobin and hematocrit, blood     Status: None   Collection Time: 01/28/17 11:25 AM  Result Value Ref Range   Hemoglobin 13.5 13.0 - 17.0 g/dL    Comment: RESULT CALLED TO, READ BACK BY AND VERIFIED WITH: DAWN ROSE,RN AT 1202 01/28/17 BY ZBEECH.    HCT 39.1 39.0 - 52.0 %  Platelet count     Status: Abnormal   Collection Time: 01/28/17 11:25 AM  Result Value Ref Range   Platelets 126 (L) 150 - 400 K/uL  I-STAT, chem 8     Status: Abnormal   Collection Time: 01/28/17 11:32 AM  Result Value Ref Range   Sodium 135 135 - 145 mmol/L   Potassium 5.9 (H) 3.5 - 5.1 mmol/L   Chloride 102 101 - 111 mmol/L   BUN 21 (H) 6 - 20 mg/dL   Creatinine, Ser 0.90 0.61 - 1.24 mg/dL   Glucose, Bld 158 (H) 65 - 99 mg/dL   Calcium, Ion 1.03 (L) 1.15 - 1.40 mmol/L   TCO2 23 22 - 32 mmol/L   Hemoglobin 13.3 13.0 - 17.0 g/dL   HCT 39.0 39.0 - 52.0 %  I-STAT, chem 8     Status: Abnormal   Collection Time: 01/28/17 12:24 PM  Result Value Ref Range   Sodium 137 135 - 145 mmol/L   Potassium 4.6 3.5 - 5.1 mmol/L    Chloride 102 101 - 111 mmol/L   BUN 20 6 - 20 mg/dL   Creatinine, Ser 0.80 0.61 - 1.24 mg/dL   Glucose, Bld 149 (H) 65 - 99 mg/dL   Calcium, Ion 1.01 (L) 1.15 - 1.40 mmol/L   TCO2 26 22 - 32 mmol/L   Hemoglobin 11.6 (L) 13.0 - 17.0 g/dL   HCT 34.0 (L) 39.0 - 52.0 %  I-STAT, chem 8     Status: Abnormal   Collection Time: 01/28/17 12:28 PM  Result Value Ref Range   Sodium 137 135 - 145 mmol/L   Potassium 4.6  3.5 - 5.1 mmol/L   Chloride 102 101 - 111 mmol/L   BUN 20 6 - 20 mg/dL   Creatinine, Ser 0.69 0.61 - 1.24 mg/dL   Glucose, Bld 910 (H) 65 - 99 mg/dL   Calcium, Ion 7.91 (L) 1.15 - 1.40 mmol/L   TCO2 25 22 - 32 mmol/L   Hemoglobin 11.6 (L) 13.0 - 17.0 g/dL   HCT 77.7 (L) 60.7 - 06.8 %  CBC     Status: Abnormal   Collection Time: 01/28/17  1:29 PM  Result Value Ref Range   WBC 16.2 (H) 4.0 - 10.5 K/uL   RBC 4.58 4.22 - 5.81 MIL/uL   Hemoglobin 14.7 13.0 - 17.0 g/dL    Comment: REPEATED TO VERIFY   HCT 43.6 39.0 - 52.0 %   MCV 95.2 78.0 - 100.0 fL   MCH 32.1 26.0 - 34.0 pg   MCHC 33.7 30.0 - 36.0 g/dL   RDW 20.9 95.0 - 59.6 %   Platelets 135 (L) 150 - 400 K/uL  Protime-INR     Status: Abnormal   Collection Time: 01/28/17  1:29 PM  Result Value Ref Range   Prothrombin Time 15.7 (H) 11.4 - 15.2 seconds   INR 1.26   APTT     Status: None   Collection Time: 01/28/17  1:29 PM  Result Value Ref Range   aPTT 25 24 - 36 seconds  I-STAT 4, (NA,K, GLUC, HGB,HCT)     Status: Abnormal   Collection Time: 01/28/17  1:37 PM  Result Value Ref Range   Sodium 139 135 - 145 mmol/L   Potassium 4.2 3.5 - 5.1 mmol/L   Glucose, Bld 153 (H) 65 - 99 mg/dL   HCT 17.8 23.9 - 23.9 %   Hemoglobin 14.6 13.0 - 17.0 g/dL  I-STAT 3, arterial blood gas (G3+)     Status: Abnormal   Collection Time: 01/28/17  2:25 PM  Result Value Ref Range   pH, Arterial 7.359 7.350 - 7.450   pCO2 arterial 42.6 32.0 - 48.0 mmHg   pO2, Arterial 71.0 (L) 83.0 - 108.0 mmHg   Bicarbonate 24.2 20.0 - 28.0 mmol/L    TCO2 26 22 - 32 mmol/L   O2 Saturation 94.0 %   Acid-base deficit 2.0 0.0 - 2.0 mmol/L   Patient temperature 36.1 C    Collection site ARTERIAL LINE    Drawn by Operator    Sample type ARTERIAL   Glucose, capillary     Status: Abnormal   Collection Time: 01/28/17  2:30 PM  Result Value Ref Range   Glucose-Capillary 132 (H) 65 - 99 mg/dL  Glucose, capillary     Status: Abnormal   Collection Time: 01/28/17  3:35 PM  Result Value Ref Range   Glucose-Capillary 125 (H) 65 - 99 mg/dL   Comment 1 Capillary Specimen    Comment 2 Notify RN    Comment 3 Document in Chart   Glucose, capillary     Status: Abnormal   Collection Time: 01/28/17  4:37 PM  Result Value Ref Range   Glucose-Capillary 115 (H) 65 - 99 mg/dL   Comment 1 Capillary Specimen    Comment 2 Notify RN    Comment 3 Document in Chart   I-STAT 3, arterial blood gas (G3+)     Status: Abnormal   Collection Time: 01/28/17  4:45 PM  Result Value Ref Range   pH, Arterial 7.369 7.350 - 7.450   pCO2 arterial 40.4 32.0 - 48.0 mmHg  pO2, Arterial 76.0 (L) 83.0 - 108.0 mmHg   Bicarbonate 23.5 20.0 - 28.0 mmol/L   TCO2 25 22 - 32 mmol/L   O2 Saturation 95.0 %   Acid-base deficit 2.0 0.0 - 2.0 mmol/L   Patient temperature 36.4 C    Collection site ARTERIAL LINE    Drawn by Operator    Sample type ARTERIAL   Glucose, capillary     Status: Abnormal   Collection Time: 01/28/17  5:42 PM  Result Value Ref Range   Glucose-Capillary 113 (H) 65 - 99 mg/dL   Comment 1 Capillary Specimen    Comment 2 Notify RN    Comment 3 Document in Chart   Glucose, capillary     Status: Abnormal   Collection Time: 01/28/17  6:11 PM  Result Value Ref Range   Glucose-Capillary 111 (H) 65 - 99 mg/dL  I-STAT 3, arterial blood gas (G3+)     Status: Abnormal   Collection Time: 01/28/17  6:13 PM  Result Value Ref Range   pH, Arterial 7.350 7.350 - 7.450   pCO2 arterial 39.3 32.0 - 48.0 mmHg   pO2, Arterial 76.0 (L) 83.0 - 108.0 mmHg   Bicarbonate  21.8 20.0 - 28.0 mmol/L   TCO2 23 22 - 32 mmol/L   O2 Saturation 95.0 %   Acid-base deficit 4.0 (H) 0.0 - 2.0 mmol/L   Patient temperature 36.5 C    Collection site ARTERIAL LINE    Drawn by Operator    Sample type ARTERIAL   I-STAT, chem 8     Status: Abnormal   Collection Time: 01/28/17  7:27 PM  Result Value Ref Range   Sodium 138 135 - 145 mmol/L   Potassium 4.3 3.5 - 5.1 mmol/L   Chloride 105 101 - 111 mmol/L   BUN 18 6 - 20 mg/dL   Creatinine, Ser 0.90 0.61 - 1.24 mg/dL   Glucose, Bld 112 (H) 65 - 99 mg/dL   Calcium, Ion 1.13 (L) 1.15 - 1.40 mmol/L   TCO2 22 22 - 32 mmol/L   Hemoglobin 15.0 13.0 - 17.0 g/dL   HCT 44.0 39.0 - 52.0 %  Creatinine, serum     Status: None   Collection Time: 01/28/17  7:30 PM  Result Value Ref Range   Creatinine, Ser 1.05 0.61 - 1.24 mg/dL   GFR calc non Af Amer >60 >60 mL/min   GFR calc Af Amer >60 >60 mL/min    Comment: (NOTE) The eGFR has been calculated using the CKD EPI equation. This calculation has not been validated in all clinical situations. eGFR's persistently <60 mL/min signify possible Chronic Kidney Disease.   CBC     Status: Abnormal   Collection Time: 01/28/17  7:30 PM  Result Value Ref Range   WBC 17.6 (H) 4.0 - 10.5 K/uL   RBC 4.67 4.22 - 5.81 MIL/uL   Hemoglobin 15.0 13.0 - 17.0 g/dL   HCT 44.2 39.0 - 52.0 %   MCV 94.6 78.0 - 100.0 fL   MCH 32.1 26.0 - 34.0 pg   MCHC 33.9 30.0 - 36.0 g/dL   RDW 13.1 11.5 - 15.5 %   Platelets 160 150 - 400 K/uL  Magnesium     Status: Abnormal   Collection Time: 01/28/17  7:30 PM  Result Value Ref Range   Magnesium 2.7 (H) 1.7 - 2.4 mg/dL  I-STAT 3, arterial blood gas (G3+)     Status: Abnormal   Collection Time: 01/28/17  7:31 PM  Result Value Ref Range   pH, Arterial 7.333 (L) 7.350 - 7.450   pCO2 arterial 40.4 32.0 - 48.0 mmHg   pO2, Arterial 76.0 (L) 83.0 - 108.0 mmHg   Bicarbonate 21.6 20.0 - 28.0 mmol/L   TCO2 23 22 - 32 mmol/L   O2 Saturation 95.0 %   Acid-base deficit  4.0 (H) 0.0 - 2.0 mmol/L   Patient temperature 36.5 C    Collection site ARTERIAL LINE    Drawn by Operator    Sample type ARTERIAL   Glucose, capillary     Status: Abnormal   Collection Time: 01/28/17  9:04 PM  Result Value Ref Range   Glucose-Capillary 128 (H) 65 - 99 mg/dL  Glucose, capillary     Status: Abnormal   Collection Time: 01/28/17  9:59 PM  Result Value Ref Range   Glucose-Capillary 122 (H) 65 - 99 mg/dL  Glucose, capillary     Status: None   Collection Time: 01/28/17 11:00 PM  Result Value Ref Range   Glucose-Capillary 96 65 - 99 mg/dL  Glucose, capillary     Status: Abnormal   Collection Time: 01/28/17 11:58 PM  Result Value Ref Range   Glucose-Capillary 116 (H) 65 - 99 mg/dL  Glucose, capillary     Status: Abnormal   Collection Time: 01/29/17  1:02 AM  Result Value Ref Range   Glucose-Capillary 118 (H) 65 - 99 mg/dL  Glucose, capillary     Status: Abnormal   Collection Time: 01/29/17  2:00 AM  Result Value Ref Range   Glucose-Capillary 113 (H) 65 - 99 mg/dL  Glucose, capillary     Status: Abnormal   Collection Time: 01/29/17  3:10 AM  Result Value Ref Range   Glucose-Capillary 110 (H) 65 - 99 mg/dL  CBC     Status: Abnormal   Collection Time: 01/29/17  3:19 AM  Result Value Ref Range   WBC 14.8 (H) 4.0 - 10.5 K/uL   RBC 4.59 4.22 - 5.81 MIL/uL   Hemoglobin 14.9 13.0 - 17.0 g/dL   HCT 43.7 39.0 - 52.0 %   MCV 95.2 78.0 - 100.0 fL   MCH 32.5 26.0 - 34.0 pg   MCHC 34.1 30.0 - 36.0 g/dL   RDW 13.3 11.5 - 15.5 %   Platelets 150 150 - 400 K/uL  Basic metabolic panel     Status: Abnormal   Collection Time: 01/29/17  3:19 AM  Result Value Ref Range   Sodium 133 (L) 135 - 145 mmol/L   Potassium 4.1 3.5 - 5.1 mmol/L   Chloride 104 101 - 111 mmol/L   CO2 19 (L) 22 - 32 mmol/L   Glucose, Bld 117 (H) 65 - 99 mg/dL   BUN 12 6 - 20 mg/dL   Creatinine, Ser 0.97 0.61 - 1.24 mg/dL   Calcium 7.9 (L) 8.9 - 10.3 mg/dL   GFR calc non Af Amer >60 >60 mL/min   GFR  calc Af Amer >60 >60 mL/min    Comment: (NOTE) The eGFR has been calculated using the CKD EPI equation. This calculation has not been validated in all clinical situations. eGFR's persistently <60 mL/min signify possible Chronic Kidney Disease.    Anion gap 10 5 - 15  Magnesium     Status: None   Collection Time: 01/29/17  3:19 AM  Result Value Ref Range   Magnesium 2.2 1.7 - 2.4 mg/dL  Glucose, capillary     Status: Abnormal   Collection Time: 01/29/17  4:03  AM  Result Value Ref Range   Glucose-Capillary 119 (H) 65 - 99 mg/dL  Glucose, capillary     Status: Abnormal   Collection Time: 01/29/17  4:52 AM  Result Value Ref Range   Glucose-Capillary 128 (H) 65 - 99 mg/dL  Glucose, capillary     Status: Abnormal   Collection Time: 01/29/17  6:08 AM  Result Value Ref Range   Glucose-Capillary 124 (H) 65 - 99 mg/dL  Glucose, capillary     Status: Abnormal   Collection Time: 01/29/17  7:01 AM  Result Value Ref Range   Glucose-Capillary 124 (H) 65 - 99 mg/dL    ECG   Sinus with PAC's at 7 - Personally Reviewed  Telemetry   Sinus rhythm - Personally Reviewed  Radiology    Dg Chest Port 1 View  Result Date: 01/29/2017 CLINICAL DATA:  Chest tube. EXAM: PORTABLE CHEST 1 VIEW COMPARISON:  Radiograph of January 28, 2017. FINDINGS: Stable cardiomediastinal silhouette. Status post coronary artery bypass graft. Endotracheal and nasogastric tubes have been removed. Right internal jugular catheter is noted with distal tip in expected position of main pulmonary artery. No pneumothorax is noted. Increased bibasilar atelectasis or edema is noted with probable minimal associated pleural effusions. Left-sided chest tube is unchanged in position. Bony thorax is unremarkable. IMPRESSION: Endotracheal and nasogastric tubes have been removed. Left-sided chest tube is unchanged in position with no pneumothorax. Increased bibasilar atelectasis or edema is noted with associated minimal pleural  effusions. Electronically Signed   By: Marijo Conception, M.D.   On: 01/29/2017 07:55   Dg Chest Port 1 View  Result Date: 01/28/2017 CLINICAL DATA:  Status post CABG EXAM: PORTABLE CHEST 1 VIEW COMPARISON:  Five days ago FINDINGS: Endotracheal tube tip is just below the clavicular heads. An orogastric tube reaches least. Mediastinal drains are present. Swan-Ganz catheter from right IJ approach with tip near the pulmonary artery outflow tract. Cardiopericardial enlargement, accentuated by low volumes. Mild atelectatic opacities with small left pleural effusion. No visible pneumothorax. IMPRESSION: 1. Unremarkable positioning of tubes and central line. 2. Small left effusion and mild atelectasis. No visible pneumothorax. Electronically Signed   By: Monte Fantasia M.D.   On: 01/28/2017 14:11    Cardiac Studies   N/A  Assessment   1. Principal Problem: 2.   NSTEMI (non-ST elevated myocardial infarction) ([place]) 3. Active Problems: 4.   Diabetes mellitus with complication ([place]) 5.   Morbid obesity ([place]) 6.   Mixed dyslipidemia 7.   S/P CABG x 4 8.   Plan   1. Doing well POD#1 - extubated. Weaning pressors. Maintaining sinus rhythm. On insulin gtts - FBG 117 this am. On ASA, lipitor 80 mg and metoprolol. No further suggestions at this time.  Will follow peripherally and be available as needed throughout the week.  Time Spent Directly with Patient:  I have spent a total of 25 minutes with the patient reviewing hospital notes, telemetry, EKGs, labs and examining the patient as well as establishing an assessment and plan that was discussed personally with the patient. > 50% of time was spent in direct patient care.  Length of Stay:  LOS: 6 days   Pixie Casino, MD, [place]  Attending Cardiologist  Direct Dial: [phone]  Fax: [phone]  Website:  [email or web address] 01/29/2017, 8:26 AM

## 2017-01-29 NOTE — Anesthesia Postprocedure Evaluation (Signed)
Anesthesia Post Note  Patient: Joshua Howell  Procedure(s) Performed: Procedure(s) (LRB): CORONARY ARTERY BYPASS GRAFTING (CABG ) x 4 USING LEFT INTERNAL MAMMARY ARTERY AND ENDOSCOPIC HARVESTING OF RIGHT SAPHENOUS VEIN,  LIMA-LAD SVG-RAMUS SEQ SVG-PD-PL (N/A) TRANSESOPHAGEAL ECHOCARDIOGRAM (TEE) (N/A)     Patient location during evaluation: SICU Anesthesia Type: General Level of consciousness: awake and alert, patient cooperative and oriented Pain management: pain level controlled Vital Signs Assessment: post-procedure vital signs reviewed and stable Respiratory status: spontaneous breathing, nonlabored ventilation, respiratory function stable and patient connected to nasal cannula oxygen Cardiovascular status: blood pressure returned to baseline and stable Postop Assessment: no apparent nausea or vomiting and adequate PO intake Anesthetic complications: no    Last Vitals:  Vitals:   01/29/17 1541 01/29/17 1600  BP:  (!) 99/44  Pulse:  61  Resp:  (!) 23  Temp: 36.8 C   SpO2:  98%    Last Pain:  Vitals:   01/29/17 1650  TempSrc:   PainSc: 2                  Jeanluc Wegman,E. Nahomy [place]

## 2017-01-29 NOTE — Progress Notes (Signed)
1 Day Post-Op Procedure(s) (LRB): CORONARY ARTERY BYPASS GRAFTING (CABG ) x 4 USING LEFT INTERNAL MAMMARY ARTERY AND ENDOSCOPIC HARVESTING OF RIGHT SAPHENOUS VEIN,  LIMA-LAD SVG-RAMUS SEQ SVG-PD-PL (N/A) TRANSESOPHAGEAL ECHOCARDIOGRAM (TEE) (N/A) Subjective: Some pain with deep breath  Objective: Vital signs in last 24 hours: Temp:  [97 F (36.1 C)-98.8 F (37.1 C)] 98.1 F (36.7 C) (09/18 0800) Pulse Rate:  [64-94] 70 (09/18 0800) Cardiac Rhythm: Normal sinus rhythm (09/18 0700) Resp:  [0-23] 14 (09/18 0800) BP: (95-131)/(66-90) 116/66 (09/18 0800) SpO2:  [91 %-99 %] 98 % (09/18 0800) Arterial Line BP: (91-142)/(48-72) 122/55 (09/18 0800) FiO2 (%):  [40 %-50 %] 40 % (09/17 1812) Weight:  [280 lb 9.6 oz (127.3 kg)] 280 lb 9.6 oz (127.3 kg) (09/18 0440)  Hemodynamic parameters for last 24 hours: PAP: (19-46)/(4-35) 29/10 CO:  [5.1 L/min-7.2 L/min] 7.2 L/min CI:  [2.1 L/min/m2-2.9 L/min/m2] 2.9 L/min/m2  Intake/Output from previous day: 09/17 0701 - 09/18 0700 In: 5024.9 [P.O.:240; I.V.:3309.9; Blood:575; IV Piggyback:900] Out: 5575 [Urine:3950; Emesis/NG output:120; Blood:1215; Chest Tube:290] Intake/Output this shift: No intake/output data recorded.  General appearance: alert, cooperative and no distress Neurologic: intact Heart: regular rate and rhythm Lungs: diminished breath sounds bibasilar Abdomen: normal findings: soft, non-tender  Lab Results:  Recent Labs  01/28/17 1930 01/29/17 0319  WBC 17.6* 14.8*  HGB 15.0 14.9  HCT 44.2 43.7  PLT 160 150   BMET:  Recent Labs  01/28/17 1927 01/28/17 1930 01/29/17 0319  NA 138  --  133*  K 4.3  --  4.1  CL 105  --  104  CO2  --   --  19*  GLUCOSE 112*  --  117*  BUN 18  --  12  CREATININE 0.90 1.05 0.97  CALCIUM  --   --  7.9*    PT/INR:  Recent Labs  01/28/17 1329  LABPROT 15.7*  INR 1.26   ABG    Component Value Date/Time   PHART 7.333 (L) 01/28/2017 1931   HCO3 21.6 01/28/2017 1931   TCO2 23  01/28/2017 1931   ACIDBASEDEF 4.0 (H) 01/28/2017 1931   O2SAT 95.0 01/28/2017 1931   CBG (last 3)   Recent Labs  01/29/17 0452 01/29/17 0608 01/29/17 0701  GLUCAP 128* 124* 124*    Assessment/Plan: S/P Procedure(s) (LRB): CORONARY ARTERY BYPASS GRAFTING (CABG ) x 4 USING LEFT INTERNAL MAMMARY ARTERY AND ENDOSCOPIC HARVESTING OF RIGHT SAPHENOUS VEIN,  LIMA-LAD SVG-RAMUS SEQ SVG-PD-PL (N/A) TRANSESOPHAGEAL ECHOCARDIOGRAM (TEE) (N/A) -CV- stable. In SR  Dc swan, A line  Wean dopamine and neo drips  ASA, statin, beta blocker  -RESP- IS for atelectasis  -RENAL- creatinine and lytes OK  -ENDO- CBG well controlled- transition to levemir + SSI  DC Chest tubes, A line  SCD + enoxaparin for DVT prophylaxis  Cardiac rehab   LOS: 6 days    [name] 01/29/2017

## 2017-01-29 NOTE — Progress Notes (Signed)
[facility] BRIEF SICU PROGRESS NOTE  1 Day Post-Op  S/P Procedure(s) (LRB): CORONARY ARTERY BYPASS GRAFTING (CABG ) x 4 USING LEFT INTERNAL MAMMARY ARTERY AND ENDOSCOPIC HARVESTING OF RIGHT SAPHENOUS VEIN,  [facility] SVG-RAMUS SEQ SVG-PD-PL (N/A) TRANSESOPHAGEAL ECHOCARDIOGRAM (TEE) (N/A)   Stable day NSR w/ stable BP Breathing comfortably w/ O2 sats 92-98% on RA UOP adequate Labs okay  Plan: Continue current plan  Purcell Nails, MD 01/29/2017 5:52 PM

## 2017-01-29 NOTE — Plan of Care (Signed)
Problem: Activity: Goal: Risk for activity intolerance will decrease Outcome: Progressing Pt out of bed to chair without any issues. Ambulated 370 ft with minimal assist. Follows sternal precautions.   Problem: Cardiac: Goal: Hemodynamic stability will improve Outcome: Completed/Met Date Met: 01/29/17 Pt weaned off pressors and vital signs remain stable.Chest tubes, arterial line, and Swan discontinue per order.

## 2017-01-29 NOTE — Op Note (Signed)
NAMEJUSTICE, MILLIRON NO.:  [id number]  MEDICAL RECORD NO.:  [id number]  LOCATION:                                 FACILITY:  PHYSICIAN:  Salvatore Decent. Dorris Fetch, M.D. DATE OF BIRTH:  DATE OF PROCEDURE:  01/28/2017 DATE OF DISCHARGE:                              OPERATIVE REPORT   POSTOPERATIVE DIAGNOSIS:  Severe 3-vessel coronary artery disease, status post myocardial infarction.  POSTOPERATIVE DIAGNOSIS:  Severe 3-vessel coronary artery disease, status post myocardial infarction.  PROCEDURE:  Median sternotomy, extracorporeal circulation,  Coronary artery bypass grafting x4  Left internal mammary artery to left anterior descending,  Saphenous vein graft to ramus intermedius,  Sequential saphenous vein graft to posterior descending and posterolateral Endoscopic vein harvest of right leg.  SURGEON:  Salvatore Decent. Dorris Fetch, M.D.  ASSISTANT:  Rowe Clack, P.A.-C.  ANESTHESIA:  General.  FINDINGS:  No vein found on exploration of the left leg.  Vein harvested from right leg good quality.  Mammary good quality.  LAD, ramus intermedius, and posterior descending good quality targets. Posterolateral 1 mm, fair quality target.  Transesophageal echocardiography showed global hypokinesis with ejection fraction of 40%.  There was mild mitral regurgitation.  CLINICAL NOTE:  Mr. Kissel is a 56 year old gentleman with multiple cardiac risk factors, who presented after an out-of-hospital MI.  On catheterization, he had severe 3-vessel coronary artery disease.  He was advised to undergo coronary artery bypass grafting.  The indications, risks, benefits, and alternatives were discussed in detail with the patient.  He understood and accepted the risks and agreed to proceed.  OPERATIVE NOTE:  Mr. Marsiglia was brought to the preop holding area on January 28, 2017.  Anesthesia placed a Swan-Ganz catheter and arterial blood pressure monitoring line.  He was taken to the  operating room, anesthetized, and intubated.  Intravenous antibiotics were administered. A Foley catheter was placed.  Transesophageal echocardiography was performed.  It revealed an ejection fraction of 40% with global hypokinesis and mild mitral regurgitation.  The chest, abdomen, and legs were prepped and draped in usual sterile fashion.  An incision made in the medial aspect of the left leg at the level of the knee.  The greater saphenous vein could not be identified in that incision.  An incision was made in the medial aspect of the right leg at the level of the knee. The greater saphenous vein was identified.  The vein was harvested endoscopically from the upper calf to the groin.  It was a good quality vessel.  Simultaneously, with the vein harvest, a median sternotomy was performed and the left internal mammary artery was harvested using standard technique.  It likewise was a good quality vessel that had excellent flow when divided distally.  The patient was fully heparinized.  A sternal retractor was placed.  The pericardium was opened.  The ascending aorta was inspected.  After confirming adequate anticoagulation with ACT measurement, the aorta was cannulated via concentric 2-0 Ethibond pledgeted pursestring sutures.  A dual-stage venous cannula was placed via pursestring suture in the right atrial appendage.  Cardiopulmonary bypass was initiated.  Flows were maintained per protocol.  The patient was cooled to 32 degrees Celsius.  The coronary  arteries were inspected and anastomotic sites were chosen.  The conduits were inspected and cut to length.  A foam pad was placed in the pericardium to insulate the heart.  A temperature probe was placed in the myocardial septum, and a cardioplegia cannula was placed in the ascending aorta.  The aorta was crossclamped.  The left ventricle was emptied via the aortic root vent.  Cardiac arrest then was achieved a combination of cold  antegrade blood cardioplegia and topical iced saline.  750 mL of cardioplegia was administered.  There was a rapid diastolic arrest and septal cooling to 10 degrees Celsius.  A reversed saphenous vein graft was placed sequentially to the posterior descending and posterolateral branches of the right coronary artery. Posterior descending was a 1.5-mm, good quality target that arose at the acute margin and traversed to the distal septum.  A side-to-side anastomosis was performed with a running 7-0 Prolene suture.  All anastomoses were probed proximally and distally to ensure patency before tying the sutures.  The distal end of the vein was beveled and it was anastomosed end-to-side to the posterolateral.  There were multiple tiny posterolateral branches.  This was the only one that was large enough to graft.  It was a 1-mm, fair quality target.  The end-to-side anastomosis was performed with a running 7-0 Prolene suture and a 1-mm probe did pass distally before tying the suture.  Cardioplegia was administered down the graft.  There were good flow and good hemostasis.  Next, the heart was elevated.  A reversed saphenous vein graft was placed end-to-side to the ramus intermedius.  This was a 1.5-mm, good quality target.  The vein was of good quality.  The anastomosis was performed with a running 7-0 Prolene suture.  A probe passed easily.  Cardioplegia was administered.  There was good flow and there was good hemostasis. Additional cardioplegia was administered down the aortic root.  The left internal mammary artery was brought through a window in the pericardium.  The distal end was beveled.  It was a 2 mm, good quality conduit.  The LAD was grafted in its midportion.  It was intramyocardial at the site, but was also a 2-mm, good quality target.  The end-to-side anastomosis was performed with a running 8-0 Prolene suture. At the completion the mammary to LAD anastomosis, the bulldog clamp  was briefly removed.  There was good hemostasis.  There was rapid septal rewarming.  The bulldog clamp was replaced.  The mammary pedicle was tacked to the epicardial surface of the heart with 6-0 Prolene sutures.  Additional cardioplegia was administered.  The vein grafts were cut to length.  The cardioplegia cannula was removed from the ascending aorta. The proximal vein graft anastomoses were performed to 4.5 mm punch aortotomies with running 6-0 Prolene sutures.  At the completion of the final proximal anastomosis, the patient was placed in steep Trendelenburg position.  Lidocaine was administered.  The left ventricle and aortic root were de-aired, and the aortic crossclamp was removed. The total crossclamp time was 76 minutes.  The patient spontaneously resumed sinus rhythm.  He did not require defibrillation.  While rewarming was completed, all proximal and distal anastomoses were inspected for hemostasis.  Epicardial pacing wires were placed on the right ventricle and right atrium.  The patient was atrially paced at 80 beats per minute.  When the core temperature reached 37 degrees Celsius, he was weaned from cardiopulmonary bypass on the first attempt.  Total bypass time was 120 minutes.  He was weaned from bypass on a low-dose dopamine infusion at 3 mcg per kg per minute.  The initial cardiac index was greater than 2 L/min/m2; and the patient remained hemodynamically stable throughout the post-bypass period.  Post-bypass transesophageal echocardiography was essentially unchanged from the prebypass study.  A test dose of protamine was administered and it was well tolerated. The atrial and aortic cannulae were removed.  The remainder of the protamine was administered without incident.  The chest was irrigated with warm saline.  Hemostasis was achieved.  The pericardium was reapproximated over the ascending aorta and base of the heart with interrupted 3-0 silk sutures.  Left  pleural and mediastinal chest tubes were placed through separate subcostal incisions.  The sternum was closed with a combination of single and double heavy gauge stainless steel wires.  The pectoralis fascia, subcutaneous tissue, and skin were closed in a standard fashion.  All sponge, needle, and instrument counts were correct at the end of the procedure.  The patient was taken from the operating room to the Surgical Intensive Care Unit intubated and in good condition.     Salvatore Decent Dorris Fetch, M.D.     SCH/MEDQ  D:  01/28/2017  T:  01/29/2017  Job:  [id number]

## 2017-01-30 ENCOUNTER — Inpatient Hospital Stay (HOSPITAL_COMMUNITY): Payer: BLUE CROSS/BLUE SHIELD

## 2017-01-30 LAB — BASIC METABOLIC PANEL
ANION GAP: 6 (ref 5–15)
BUN: 12 mg/dL (ref 6–20)
CHLORIDE: 98 mmol/L — AB (ref 101–111)
CO2: 27 mmol/L (ref 22–32)
CREATININE: 1.01 mg/dL (ref 0.61–1.24)
Calcium: 8.3 mg/dL — ABNORMAL LOW (ref 8.9–10.3)
GFR calc non Af Amer: >60 mL/min (ref >60)
Glucose, Bld: 153 mg/dL — ABNORMAL HIGH (ref 65–99)
POTASSIUM: 4.6 mmol/L (ref 3.5–5.1)
SODIUM: 131 mmol/L — AB (ref 135–145)

## 2017-01-30 LAB — CBC
HCT: 39.8 % (ref 39.0–52.0)
HEMOGLOBIN: 13.3 g/dL (ref 13.0–17.0)
MCH: 32.5 pg (ref 26.0–34.0)
MCHC: 33.4 g/dL (ref 30.0–36.0)
MCV: 97.3 fL (ref 78.0–100.0)
PLATELETS: 106 10*3/uL — AB (ref 150–400)
RBC: 4.09 MIL/uL — AB (ref 4.22–5.81)
RDW: 13.5 % (ref 11.5–15.5)
WBC: 14.1 10*3/uL — AB (ref 4.0–10.5)

## 2017-01-30 LAB — GLUCOSE, CAPILLARY
GLUCOSE-CAPILLARY: 152 mg/dL — AB (ref 65–99)
GLUCOSE-CAPILLARY: 123 mg/dL — AB (ref 65–99)
GLUCOSE-CAPILLARY: 138 mg/dL — AB (ref 65–99)
Glucose-Capillary: 161 mg/dL — ABNORMAL HIGH (ref 65–99)
Glucose-Capillary: 175 mg/dL — ABNORMAL HIGH (ref 65–99)

## 2017-01-30 MED ORDER — MAGNESIUM HYDROXIDE 400 MG/5ML PO SUSP: 30.0000 mL | Freq: Every day | ORAL | Status: DC | PRN

## 2017-01-30 MED ORDER — INSULIN DETEMIR 100 UNIT/ML ~~LOC~~ SOLN
25.0000 [IU] | Freq: Every day | SUBCUTANEOUS | Status: DC
  Administered 2017-01-31: 25 [IU] via SUBCUTANEOUS
  Filled 2017-01-30 (×2): qty 0.25

## 2017-01-30 MED ORDER — MOVING RIGHT ALONG BOOK
Freq: Once | Status: DC
  Filled 2017-01-30: qty 1

## 2017-01-30 MED ORDER — METFORMIN HCL 500 MG PO TABS
500.0000 mg | ORAL_TABLET | Freq: Two times a day (BID) | ORAL | Status: DC
  Administered 2017-01-30 – 2017-02-01 (×5): 500 mg via ORAL
  Filled 2017-01-30 (×5): qty 1

## 2017-01-30 MED ORDER — ALUM & MAG HYDROXIDE-SIMETH 200-200-20 MG/5ML PO SUSP: 15.0000 mL | Freq: Four times a day (QID) | ORAL | Status: DC | PRN

## 2017-01-30 MED ORDER — INSULIN ASPART 100 UNIT/ML ~~LOC~~ SOLN: 0.0000 [IU] | Freq: Every day | SUBCUTANEOUS | Status: DC

## 2017-01-30 MED ORDER — SODIUM CHLORIDE 0.9 % IV SOLN: 250.0000 mL | INTRAVENOUS | Status: DC | PRN

## 2017-01-30 MED ORDER — SODIUM CHLORIDE 0.9% FLUSH
3.0000 mL | Freq: Two times a day (BID) | INTRAVENOUS | Status: DC
  Administered 2017-01-30 – 2017-02-01 (×4): 3 mL via INTRAVENOUS

## 2017-01-30 MED ORDER — ZOLPIDEM TARTRATE 5 MG PO TABS: 5.0000 mg | ORAL_TABLET | Freq: Every evening | ORAL | Status: DC | PRN

## 2017-01-30 MED ORDER — INSULIN ASPART 100 UNIT/ML ~~LOC~~ SOLN
0.0000 [IU] | Freq: Three times a day (TID) | SUBCUTANEOUS | Status: DC
  Administered 2017-01-30: 3 [IU] via SUBCUTANEOUS
  Administered 2017-01-30: 4 [IU] via SUBCUTANEOUS
  Administered 2017-01-31 (×2): 3 [IU] via SUBCUTANEOUS

## 2017-01-30 MED ORDER — SODIUM CHLORIDE 0.9% FLUSH: 3.0000 mL | INTRAVENOUS | Status: DC | PRN

## 2017-01-30 NOTE — Progress Notes (Signed)
2 Days Post-Op Procedure(s) (LRB): CORONARY ARTERY BYPASS GRAFTING (CABG ) x 4 USING LEFT INTERNAL MAMMARY ARTERY AND ENDOSCOPIC HARVESTING OF RIGHT SAPHENOUS VEIN,  LIMA-LAD SVG-RAMUS SEQ SVG-PD-PL (N/A) TRANSESOPHAGEAL ECHOCARDIOGRAM (TEE) (N/A) Subjective: Pain is better this AM Already ambulated around unit  Objective: Vital signs in last 24 hours: Temp:  [97.7 F (36.5 C)-98.6 F (37 C)] 97.7 F (36.5 C) (09/19 0300) Pulse Rate:  [51-91] 79 (09/19 0822) Cardiac Rhythm: Normal sinus rhythm (09/19 0400) Resp:  [13-47] 14 (09/19 0822) BP: (80-132)/(44-81) 101/64 (09/19 0822) SpO2:  [91 %-100 %] 92 % (09/19 0822) Arterial Line BP: (98-125)/(51-59) 120/59 (09/18 1300) Weight:  [287 lb 4.2 oz (130.3 kg)] 287 lb 4.2 oz (130.3 kg) (09/19 0500)  Hemodynamic parameters for last 24 hours: PAP: (18-30)/(6-14) 26/13 CO:  [5.6 L/min] 5.6 L/min CI:  [2.3 L/min/m2] 2.3 L/min/m2  Intake/Output from previous day: 09/18 0701 - 09/19 0700 In: 1583.2 [P.O.:690; I.V.:743.2; IV Piggyback:150] Out: 1620 [Urine:1600; Chest Tube:20] Intake/Output this shift: No intake/output data recorded.  General appearance: alert, cooperative and no distress Neurologic: intact Heart: regular rate and rhythm Lungs: diminished breath sounds bibasilar Abdomen: normal findings: soft, non-tender  Lab Results:  Recent Labs  01/29/17 1756 01/30/17 0324  WBC 15.4* 14.1*  HGB 14.5 13.3  HCT 43.8 39.8  PLT 125* 106*   BMET:  Recent Labs  01/29/17 0319 01/29/17 1737 01/30/17 0324  NA 133* 134* 131*  K 4.1 4.6 4.6  CL 104 97* 98*  CO2 19*  --  27  GLUCOSE 117* 175* 153*  BUN CREATININE 0.97 1.00 1.01  CALCIUM 7.9*  --  8.3*    PT/INR:  Recent Labs  01/28/17 1329  LABPROT 15.7*  INR 1.26   ABG    Component Value Date/Time   PHART 7.333 (L) 01/28/2017 1931   HCO3 21.6 01/28/2017 1931   TCO2 24 01/29/2017 1737   ACIDBASEDEF 4.0 (H) 01/28/2017 1931   O2SAT 95.0 01/28/2017 1931    CBG (last 3)   Recent Labs  01/29/17 2313 01/30/17 0310 01/30/17 0801  GLUCAP 182* 161* 152*    Assessment/Plan: S/P Procedure(s) (LRB): CORONARY ARTERY BYPASS GRAFTING (CABG ) x 4 USING LEFT INTERNAL MAMMARY ARTERY AND ENDOSCOPIC HARVESTING OF RIGHT SAPHENOUS VEIN,  LIMA-LAD SVG-RAMUS SEQ SVG-PD-PL (N/A) TRANSESOPHAGEAL ECHOCARDIOGRAM (TEE) (N/A) Plan for transfer to step-down: see transfer orders  Doing well   CV- stable in SR  ASA, statin, beta blocker, add ARB prior to dc if BP allows  RESP_ IS for atelectasis  RENAL- creatinine and K OK, mild hyponatremia- follow  ENDO_ CBG elevated, continue levemir, restart metformin  Thrombocytopenia- mild, no bleeding, follow  Continue enoxaparin for now   LOS: 7 days    [name] 01/30/2017

## 2017-01-30 NOTE — Progress Notes (Signed)
Pt received from 2H. Oriented to room and equipment. Re-routed dinner tray. Telemetry applied, CCMD notified. VSS. Call bell within reach, will continue to monitor.   Leonidas Romberg, RN

## 2017-01-30 NOTE — Progress Notes (Signed)
Report given to [facility] on 4E. No questions at this time. Pt made aware of new room change 4E08. Daughter at bedside also made aware.

## 2017-01-31 ENCOUNTER — Inpatient Hospital Stay (HOSPITAL_COMMUNITY): Payer: BLUE CROSS/BLUE SHIELD

## 2017-01-31 LAB — BASIC METABOLIC PANEL
ANION GAP: 9 (ref 5–15)
BUN: 11 mg/dL (ref 6–20)
CALCIUM: 8.2 mg/dL — AB (ref 8.9–10.3)
CO2: 25 mmol/L (ref 22–32)
Chloride: 98 mmol/L — ABNORMAL LOW (ref 101–111)
Creatinine, Ser: 0.97 mg/dL (ref 0.61–1.24)
GFR calc non Af Amer: >60 mL/min (ref >60)
GLUCOSE: 130 mg/dL — AB (ref 65–99)
POTASSIUM: 4.4 mmol/L (ref 3.5–5.1)
Sodium: 132 mmol/L — ABNORMAL LOW (ref 135–145)

## 2017-01-31 LAB — CBC
HEMATOCRIT: 40 % (ref 39.0–52.0)
HEMOGLOBIN: 13 g/dL (ref 13.0–17.0)
MCH: 31.5 pg (ref 26.0–34.0)
MCHC: 32.5 g/dL (ref 30.0–36.0)
MCV: 96.9 fL (ref 78.0–100.0)
Platelets: 117 10*3/uL — ABNORMAL LOW (ref 150–400)
RBC: 4.13 MIL/uL — AB (ref 4.22–5.81)
RDW: 13.3 % (ref 11.5–15.5)
WBC: 11.2 10*3/uL — AB (ref 4.0–10.5)

## 2017-01-31 LAB — GLUCOSE, CAPILLARY
GLUCOSE-CAPILLARY: 119 mg/dL — AB (ref 65–99)
Glucose-Capillary: 121 mg/dL — ABNORMAL HIGH (ref 65–99)
Glucose-Capillary: 108 mg/dL — ABNORMAL HIGH (ref 65–99)

## 2017-01-31 MED ORDER — FUROSEMIDE 40 MG PO TABS: 40.0000 mg | ORAL_TABLET | Freq: Every day | ORAL | Status: DC

## 2017-01-31 MED ORDER — POTASSIUM CHLORIDE CRYS ER 20 MEQ PO TBCR: 20.0000 meq | EXTENDED_RELEASE_TABLET | Freq: Every day | ORAL | Status: DC

## 2017-01-31 NOTE — Progress Notes (Signed)
Epicardial pacing wires removed without difficulty.  Will continue to monitor. 

## 2017-01-31 NOTE — Progress Notes (Signed)
[id number] Pt has walked many times today independently. Have observed him up with steady gait. Education completed with pt who voiced understanding. Encouraged IS and sternal precautions. Gave diabetic and heart healthy diets. Discussed CRP 2 and referring to [place] program.  Luetta Nutting RN BSN 01/31/2017 2:47 PM

## 2017-01-31 NOTE — Progress Notes (Addendum)
      [address]       [place] [address]             [phone]      3 Days Post-Op Procedure(s) (LRB): CORONARY ARTERY BYPASS GRAFTING (CABG ) x 4 USING LEFT INTERNAL MAMMARY ARTERY AND ENDOSCOPIC HARVESTING OF RIGHT SAPHENOUS VEIN,  LIMA-LAD SVG-RAMUS SEQ SVG-PD-PL (N/A) TRANSESOPHAGEAL ECHOCARDIOGRAM (TEE) (N/A)   Subjective:  Joshua Howell has no specific complaints.  He denies chest pain and shortness of breath, but states he has the expected soreness from the surgery.  + ambulation  + BM  Objective: Vital signs in last 24 hours: Temp:  [98.2 F (36.8 C)-99.2 F (37.3 C)] 99.2 F (37.3 C) (09/20 0516) Pulse Rate:  [76-94] 86 (09/20 0516) Cardiac Rhythm: Normal sinus rhythm (09/20 0700) Resp:  [13-31] 19 (09/20 0516) BP: (80-117)/(62-78) 106/62 (09/20 0516) SpO2:  [92 %-96 %] 94 % (09/20 0516) Weight:  [288 lb 11.2 oz (131 kg)] 288 lb 11.2 oz (131 kg) (09/20 0516)  Intake/Output from previous day: 09/19 0701 - 09/20 0700 In: 333 [P.O.:240; I.V.:93] Out: 425 [Urine:425]  General appearance: alert, cooperative and no distress Heart: regular rate and rhythm Lungs: clear to auscultation bilaterally Abdomen: soft, non-tender; bowel sounds normal; no masses,  no organomegaly Extremities: edema trace Wound: clean and dry  Lab Results:  Recent Labs  01/30/17 0324 01/31/17 0341  WBC 14.1* 11.2*  HGB 13.3 13.0  HCT 39.8 40.0  PLT 106* 117*   BMET:  Recent Labs  01/30/17 0324 01/31/17 0341  NA 131* 132*  K 4.6 4.4  CL 98* 98*  CO2 27 25  GLUCOSE 153* 130*  BUN 12 11  CREATININE 1.01 0.97  CALCIUM 8.3* 8.2*    PT/INR:  Recent Labs  01/28/17 1329  LABPROT 15.7*  INR 1.26   ABG    Component Value Date/Time   PHART 7.333 (L) 01/28/2017 1931   HCO3 21.6 01/28/2017 1931   TCO2 24 01/29/2017 1737   ACIDBASEDEF 4.0 (H) 01/28/2017 1931   O2SAT 95.0 01/28/2017 1931   CBG (last 3)   Recent Labs  01/30/17 1147 01/30/17 1634  01/30/17 2143  GLUCAP 123* 175* 138*    Assessment/Plan: S/P Procedure(s) (LRB): CORONARY ARTERY BYPASS GRAFTING (CABG ) x 4 USING LEFT INTERNAL MAMMARY ARTERY AND ENDOSCOPIC HARVESTING OF RIGHT SAPHENOUS VEIN,  LIMA-LAD SVG-RAMUS SEQ SVG-PD-PL (N/A) TRANSESOPHAGEAL ECHOCARDIOGRAM (TEE) (N/A)  1. CV- NSR, BP remains labile, continue Lopressor 2. Pulm- no acute issues, off oxygen, CXR with mild atelectasis bilaterally, stable appearance of left apical pneumothorax 3. Renal- creatinine has been stable, weight is trending down, Hyponatremia stable 4. Expected post operative blood loss anemia- hgb stable at 13.0 5. Thrombocytopenia, improving at 117 6. Dispo- patient stable, maintaining NSR will d/c EPW, start gentle diuresis, possibly ready for d/c in next 24-48 hours   LOS: 8 days    BARRETT, ERIN 01/31/2017 Patient seen and examined, agree with above Doing well Stressed the importance of lifestyle modification  Viviann Spare C. Dorris Fetch, MD [facility] [phone]

## 2017-01-31 NOTE — Discharge Instructions (Signed)
Coronary Artery Bypass Grafting, Care After °This sheet gives you information about how to care for yourself after your procedure. Your health care provider may also give you more specific instructions. If you have problems or questions, contact your health care provider. °What can I expect after the procedure? °After the procedure, it is common to have: °· Nausea and a lack of appetite. °· Constipation. °· Weakness and fatigue. °· Depression or irritability. °· Pain or discomfort in your incision areas. ° °Follow these instructions at home: °Medicines °· Take over-the-counter and prescription medicines only as told by your health care provider. Do not stop taking medicines or start any new medicines without approval from your health care provider. °· If you were prescribed an antibiotic medicine, take it as told by your health care provider. Do not stop taking the antibiotic even if you start to feel better. °· Do not drive or use heavy machinery while taking prescription pain medicine. °Incision care °· Follow instructions from your health care provider about how to take care of your incisions. Make sure you: °? Wash your hands with soap and water before you change your bandage (dressing). If soap and water are not available, use hand sanitizer. °? Change your dressing as told by your health care provider. °? Leave stitches (sutures), skin glue, or adhesive strips in place. These skin closures may need to stay in place for 2 weeks or longer. If adhesive strip edges start to loosen and curl up, you may trim the loose edges. Do not remove adhesive strips completely unless your health care provider tells you to do that. °· Keep incision areas clean, dry, and protected. °· Check your incision areas every day for signs of infection. Check for: °? More redness, swelling, or pain. °? More fluid or blood. °? Warmth. °? Pus or a bad smell. °· If incisions were made in your legs: °? Avoid crossing your legs. °? Avoid  sitting for long periods of time. Change positions every 30 minutes. °? Raise (elevate) your legs when you are sitting. °Bathing °· Do not take baths, swim, or use a hot tub until your health care provider approves. °· Only take sponge baths. Pat the incisions dry. Do not rub incisions with a washcloth or towel. °· Ask your health care provider when you can shower. °Eating and drinking °· Eat foods that are high in fiber, such as raw fruits and vegetables, whole grains, beans, and nuts. Meats should be lean cut. Avoid canned, processed, and fried foods. This can help prevent constipation and is a recommended part of a heart-healthy diet. °· Drink enough fluid to keep your urine clear or pale yellow. °· Limit alcohol intake to no more than 1 drink a day for nonpregnant women and 2 drinks a day for men. One drink equals 12 oz of beer, 5 oz of wine, or 1½ oz of hard liquor. °Activity °· Rest and limit your activity as told by your health care provider. You may be instructed to: °? Stop any activity right away if you have chest pain, shortness of breath, irregular heartbeats, or dizziness. Get help right away if you have any of these symptoms. °? Move around frequently for short periods or take short walks as directed by your health care provider. Gradually increase your activities. You may need physical therapy or cardiac rehabilitation to help strengthen your muscles and build your endurance. °? Avoid lifting, pushing, or pulling anything that is heavier than 10 lb (4.5 kg) for at   least 6 weeks or as told by your health care provider. °· Do not drive until your health care provider approves. °· Ask your health care provider when you may return to work. °· Ask your health care provider when you may resume sexual activity. °General instructions °· Do not use any products that contain nicotine or tobacco, such as cigarettes and e-cigarettes. If you need help quitting, ask your health care provider. °· Take 2-3 deep  breaths every few hours during the day, while you recover. This helps expand your lungs and prevent complications like pneumonia after surgery. °· If you were given a device called an incentive spirometer, use it several times a day to practice deep breathing. Support your chest with a pillow or your arms when you take deep breaths or cough. °· Wear compression stockings as told by your health care provider. These stockings help to prevent blood clots and reduce swelling in your legs. °· Weigh yourself every day. This helps identify if your body is holding (retaining) fluid that may make your heart and lungs work harder. °· Keep all follow-up visits as told by your health care provider. This is important. °Contact a health care provider if: °· You have more redness, swelling, or pain around any incision. °· You have more fluid or blood coming from any incision. °· Any incision feels warm to the touch. °· You have pus or a bad smell coming from any incision °· You have a fever. °· You have swelling in your ankles or legs. °· You have pain in your legs. °· You gain 2 lb (0.9 kg) or more a day. °· You are nauseous or you vomit. °· You have diarrhea. °Get help right away if: °· You have chest pain that spreads to your jaw or arms. °· You are short of breath. °· You have a fast or irregular heartbeat. °· You notice a "clicking" in your breastbone (sternum) when you move. °· You have numbness or weakness in your arms or legs. °· You feel dizzy or light-headed. °Summary °· After the procedure, it is common to have pain or discomfort in the incision areas. °· Do not take baths, swim, or use a hot tub until your health care provider approves. °· Gradually increase your activities. You may need physical therapy or cardiac rehabilitation to help strengthen your muscles and build your endurance. °· Weigh yourself every day. This helps identify if your body is holding (retaining) fluid that may make your heart and lungs work  harder. °This information is not intended to replace advice given to you by your health care provider. Make sure you discuss any questions you have with your health care provider. °Document Released: 11/17/2004 Document Revised: 03/19/2016 Document Reviewed: 03/19/2016 °Elsevier Interactive Patient Education © 2018 [employer] ° ° °Endoscopic Saphenous Vein Harvesting, Care After °Refer to this sheet in the next few weeks. These instructions provide you with information about caring for yourself after your procedure. Your health care provider may also give you more specific instructions. Your treatment has been planned according to current medical practices, but problems sometimes occur. Call your health care provider if you have any problems or questions after your procedure. °What can I expect after the procedure? °After the procedure, it is common to have: °· Pain. °· Bruising. °· Swelling. °· Numbness. ° °Follow these instructions at home: °Medicine °· Take over-the-counter and prescription medicines only as told by your health care provider. °· Do not drive or operate heavy machinery   while taking prescription pain medicine. °Incision care ° °· Follow instructions from your health care provider about how to take care of the cut made during surgery (incision). Make sure you: °? Wash your hands with soap and water before you change your bandage (dressing). If soap and water are not available, use hand sanitizer. °? Change your dressing as told by your health care provider. °? Leave stitches (sutures), skin glue, or adhesive strips in place. These skin closures may need to be in place for 2 weeks or longer. If adhesive strip edges start to loosen and curl up, you may trim the loose edges. Do not remove adhesive strips completely unless your health care provider tells you to do that. °· Check your incision area every day for signs of infection. Check for: °? More redness, swelling, or pain. °? More fluid or  blood. °? Warmth. °? Pus or a bad smell. °General instructions °· Raise (elevate) your legs above the level of your heart while you are sitting or lying down. °· Do any exercises your health care providers have given you. These may include deep breathing, coughing, and walking exercises. °· Do not shower, take baths, swim, or use a hot tub unless told by your health care provider. °· Wear your elastic stocking if told by your health care provider. °· Keep all follow-up visits as told by your health care provider. This is important. °Contact a health care provider if: °· Medicine does not help your pain. °· Your pain gets worse. °· You have new leg bruises or your leg bruises get bigger. °· You have a fever. °· Your leg feels numb. °· You have more redness, swelling, or pain around your incision. °· You have more fluid or blood coming from your incision. °· Your incision feels warm to the touch. °· You have pus or a bad smell coming from your incision. °Get help right away if: °· Your pain is severe. °· You develop pain, tenderness, warmth, redness, or swelling in any part of your leg. °· You have chest pain. °· You have trouble breathing. °This information is not intended to replace advice given to you by your health care provider. Make sure you discuss any questions you have with your health care provider. °Document Released: 01/10/2011 Document Revised: 10/06/2015 Document Reviewed: 03/14/2015 °Elsevier Interactive Patient Education © 2018 Elsevier Inc[employer] ° ° °

## 2017-01-31 NOTE — Discharge Summary (Signed)
Physician Discharge Summary  Patient ID: Joshua Howell MRN: [id number] DOB/AGE: 1960-11-13 56 y.o.  Admit date: 01/23/2017 Discharge date: 02/01/2017  Admission Diagnoses: Out of hospital MI Patient Active Problem List   Diagnosis Date Noted  . Family history of premature CAD 01/23/2017  . DOE (dyspnea on exertion) 01/23/2017  . Chest pain 01/23/2017  . NSTEMI (non-ST elevated myocardial infarction) (HCC) 01/23/2017  . Mixed dyslipidemia 06/26/2016  . Snoring 06/06/2016  . Diabetes mellitus with complication (HCC) 09/03/2014  . Morbid obesity (HCC) 09/03/2014   Discharge Diagnoses:   Patient Active Problem List   Diagnosis Date Noted  . 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 Family history of premature CAD 01/23/2017  . DOE (dyspnea on exertion) 01/23/2017  . Chest pain 01/23/2017  . NSTEMI (non-ST elevated myocardial infarction) (HCC) 01/23/2017  . Mixed dyslipidemia 06/26/2016  . Snoring 06/06/2016  . Diabetes mellitus with complication (HCC) 09/03/2014  . Morbid obesity (HCC) 09/03/2014   Discharged Condition: good  History of Present Illness:  Joshua Howell is a 56 yo morbid obese white male with Hypertension, Type II DM, and family history of CAD.  He presented to the with complaints of prolonged episode of chest pain.  This had occurred 4 days prior while the patient was driving to work.  The patient attributed this to indigestion, but did admit it was more severe than usual.  The pain was relieved with belching and he noted to be fatigued, but denies nausea/vomiting, diaphoresis, and the pain did not radiate.  It ultimately relived on its own several hours later.  He presented to his PCP office for evaluation.  EKG obtained at that time was abnormal and he was sent to the [facility] Emergency Department.  His troponin levels were elevated and he was ruled in for MI.  Hospital Course:   He was admitted for further care.  Cardiac catheterization was obtained and revealed severe 3  vessel CAD.  It was felt coronary bypass grafting would be indicated and TCTS consult was requested.  Dr. Dorris Fetch evaluated the patient and felt he would benefit from coronary bypass procedure.  The risks and benefits of the procedure were explained to the patient and he was agreeable to proceed.  He was taken to the operating room and underwent CABG x 4 utilizing LIMA to LAD, SVG to Ramus Intermediate, and Sequential SVG to PDA and PLVB.  He also underwent endoscopic harvest of greater saphenous vein from the right leg.  He tolerated the procedure without difficulty, was taken to the SICU in stable condition.  He was extubated the evening of surgery.  During his stay in the SICU the patient was weaned off Dopamine and Neo-synephrine drips as tolerated.  His chest tubes and arterial lines were removed without difficulty.  He was mildly hyponatremic and was not started on diuretics due to this.  He was maintaining NSR and felt stable for transfer to the telemetry unit on POD #2.  The patient continues to make progress.  He remains in NSR and his pacing wires have been removed without difficulty.  His sodium level has remained stable.  He is ambulating independently.  He is tolerating a heart healthy diet.  He is felt to be medically stable for discharge home today.      Significant Diagnostic Studies: angiography:    Mid RCA lesion, 80 %stenosed. Proximal vessel is ectatic.  Post Atrio lesion, 100 %stenosed.  Ramus lesion, 95 %stenosed.  Ost LAD to Prox LAD lesion,  90 %stenosed.  The left ventricular systolic function is normal.  LV end diastolic pressure is normal.  The left ventricular ejection fraction is 55-65% by visual estimate.  There is no aortic valve stenosis.  Treatments: surgery:   Median sternotomy, extracorporeal circulation, coronary artery bypass grafting x4 (left internal mammary artery to left anterior descending, saphenous vein graft to ramus intermedius, sequential  saphenous vein graft to posterior descending and posterolateral), endoscopic vein harvest of right leg.  Disposition: Home  Discharge medications:  The patient has been discharged on:   1.Beta Blocker:  Yes [ x  ]                              No   [   ]                              If No, reason:  2.Ace Inhibitor/ARB: Yes [   ]                                     No  [   x ]                                     If No, reason: labile BP  3.Statin:   Yes [x   ]                  No  [   ]                  If No, reason:  4.Ecasa:  Yes  [ x  ]                  No   [   ]                  If No, reason:   Discharge Instructions    Amb Referral to Cardiac Rehabilitation    Complete by:  As directed    Diagnosis:   CABG NSTEMI     CABG X ___:  4     Allergies as of 02/01/2017   No Known Allergies     Medication List    TAKE these medications   acetaminophen 500 MG tablet Commonly known as:  TYLENOL Take 2 tablets (1,000 mg total) by mouth every 6 (six) hours as needed.   aspirin 325 MG EC tablet Take 1 tablet (325 mg total) by mouth daily.   atorvastatin 80 MG tablet Commonly known as:  LIPITOR Take 1 tablet (80 mg total) by mouth daily at 6 PM.   BENADRYL ALLERGY PO Take 25 mg by mouth as needed (allergies).   metFORMIN 500 MG tablet Commonly known as:  GLUCOPHAGE Take 1 tablet (500 mg total) by mouth 2 (two) times daily with a meal.   metoprolol tartrate 25 MG tablet Commonly known as:  LOPRESSOR Take 0.5 tablets (12.5 mg total) by mouth 2 (two) times daily.   oxyCODONE 5 MG immediate release tablet Commonly known as:  Oxy IR/ROXICODONE Take 1-2 tablets (5-10 mg total) by mouth every 3 (three) hours as needed for severe pain.            Discharge Care  Instructions        Start     Ordered   02/01/17 0000  acetaminophen (TYLENOL) 500 MG tablet  Every 6 hours PRN     02/01/17 0741   02/01/17 0000  aspirin EC 325 MG EC tablet  Daily     02/01/17  0741   02/01/17 0000  atorvastatin (LIPITOR) 80 MG tablet  Daily-1800    Question:  Supervising Provider  Answer:  [name]   02/01/17 0741   02/01/17 0000  metoprolol tartrate (LOPRESSOR) 25 MG tablet  2 times daily    Question:  Supervising Provider  Answer:  [name]   02/01/17 0741   02/01/17 0000  oxyCODONE (OXY IR/ROXICODONE) 5 MG immediate release tablet  Every  3 hours PRN    Question:  Supervising Provider  Answer:  [name]   02/01/17 1610   01/31/17 0000  Amb Referral to Cardiac Rehabilitation    Question Answer Comment  Diagnosis: CABG   Diagnosis: NSTEMI   CABG X ___ 4      01/31/17 1444     Follow-up Information    [name], MD Follow up on 03/04/2017.   Specialty:  Cardiothoracic Surgery Why:  Appointment is at 1:30, please get CXR at 1:00 at [facility] located on 1st floor of our office building  Contact information: [address] [place] [address] [phone]        Corky Crafts, MD Follow up.   Specialties:  Cardiology, Radiology, Interventional Cardiology Why:  Please contact for appointment if you have not received one within 1 week of discharge Contact information: [address] [place] [address] [phone]           Signed: Lowella Dandy 02/01/2017, 8:09 AM

## 2017-02-01 LAB — GLUCOSE, CAPILLARY
GLUCOSE-CAPILLARY: 107 mg/dL — AB (ref 65–99)
Glucose-Capillary: 111 mg/dL — ABNORMAL HIGH (ref 65–99)

## 2017-02-01 MED ORDER — ACETAMINOPHEN 500 MG PO TABS: 1000.0000 mg | ORAL_TABLET | Freq: Four times a day (QID) | ORAL | 0 refills | Status: DC | PRN

## 2017-02-01 MED ORDER — ATORVASTATIN CALCIUM 80 MG PO TABS: 80.0000 mg | ORAL_TABLET | Freq: Every day | ORAL | 3 refills | Status: DC

## 2017-02-01 MED ORDER — OXYCODONE HCL 5 MG PO TABS: 5.0000 mg | ORAL_TABLET | ORAL | 0 refills | Status: DC | PRN

## 2017-02-01 MED ORDER — ASPIRIN 325 MG PO TBEC: 325.0000 mg | DELAYED_RELEASE_TABLET | Freq: Every day | ORAL | 0 refills | Status: DC

## 2017-02-01 MED ORDER — METOPROLOL TARTRATE 25 MG PO TABS: 12.5000 mg | ORAL_TABLET | Freq: Two times a day (BID) | ORAL | 3 refills | Status: DC

## 2017-02-01 NOTE — Care Management Note (Signed)
Case Management Note Original Note Created [name], RN 01/25/2017, 9:01 AM   Patient Details  Name: Joshua Howell MRN: [id number] Date of Birth: [birth year]  Subjective/Objective:    From home with son who is 56 years old he is there in the evening, he also has a daughter who can assist in the day time if needed.  s/p left heart cath/coronary angiography, hs severe 3 vessel CAD, for CVTS consult.  9/14 0901 [name] RN, BSN - Per CVTS  Plan for CABG on Monday 9/17, conts on heparin drip.     9/17 1455 [name] RN, BSN - post op CABG,  On vent,  conts on iv abx, precedex, dopamine, neo.    9/18 1752 [name] RN, BSN - wean dopamine and neo drips, , dc chest tubes.            Action/Plan: NCM will follow for dc needs.   Expected Discharge Date:  02/01/17               Expected Discharge Plan:  Home/Self Care  In-House Referral:  NA  Discharge planning Services  CM Consult  Post Acute Care Choice:  NA Choice offered to:  NA  DME Arranged:    DME Agency:     HH Arranged:    HH Agency:     Status of Service:  Completed, signed off  If discussed at Long Length of Stay Meetings, dates discussed:    Discharge Disposition: home/self care   Additional Comments:  02/01/17- 1145- [name] RN, CM- pt for d/c home today- no CM needs noted for discharge.   [name], RN 02/01/2017, 11:55 AM [phone]

## 2017-02-01 NOTE — Progress Notes (Addendum)
      [address]       [place] [address]             [phone]      4 Days Post-Op Procedure(s) (LRB): CORONARY ARTERY BYPASS GRAFTING (CABG ) x 4 USING LEFT INTERNAL MAMMARY ARTERY AND ENDOSCOPIC HARVESTING OF RIGHT SAPHENOUS VEIN,  LIMA-LAD SVG-RAMUS SEQ SVG-PD-PL (N/A) TRANSESOPHAGEAL ECHOCARDIOGRAM (TEE) (N/A)   Subjective:  Mr. Nick has no complaints.  States he feels good wants to go home.  + ambulation independently.  + BM  Objective: Vital signs in last 24 hours: Temp:  [98 F (36.7 C)-99.1 F (37.3 C)] 98 F (36.7 C) (09/21 0435) Pulse Rate:  [76-88] 82 (09/21 0435) Cardiac Rhythm: Normal sinus rhythm (09/20 1900) Resp:  [17-20] 17 (09/21 0435) BP: (106-124)/(61-78) 117/72 (09/21 0435) SpO2:  [96 %] 96 % (09/21 0435) Weight:  [284 lb 1.6 oz (128.9 kg)] 284 lb 1.6 oz (128.9 kg) (09/21 0435)  Intake/Output from previous day: 09/20 0701 - 09/21 0700 In: 720 [P.O.:720] Out: 1400 [Urine:1400]  General appearance: alert, cooperative and no distress Heart: regular rate and rhythm Lungs: clear to auscultation bilaterally Abdomen: soft, non-tender; bowel sounds normal; no masses,  no organomegaly Extremities: edema trace Wound: clean and dry  Lab Results:  Recent Labs  01/30/17 0324 01/31/17 0341  WBC 14.1* 11.2*  HGB 13.3 13.0  HCT 39.8 40.0  PLT 106* 117*   BMET:  Recent Labs  01/30/17 0324 01/31/17 0341  NA 131* 132*  K 4.6 4.4  CL 98* 98*  CO2 27 25  GLUCOSE 153* 130*  BUN 12 11  CREATININE 1.01 0.97  CALCIUM 8.3* 8.2*    PT/INR: No results for input(s): LABPROT, INR in the last 72 hours. ABG    Component Value Date/Time   PHART 7.333 (L) 01/28/2017 1931   HCO3 21.6 01/28/2017 1931   TCO2 24 01/29/2017 1737   ACIDBASEDEF 4.0 (H) 01/28/2017 1931   O2SAT 95.0 01/28/2017 1931   CBG (last 3)   Recent Labs  01/31/17 1218 01/31/17 2042 02/01/17 0620  GLUCAP 121* 108* 111*    Assessment/Plan: S/P Procedure(s)  (LRB): CORONARY ARTERY BYPASS GRAFTING (CABG ) x 4 USING LEFT INTERNAL MAMMARY ARTERY AND ENDOSCOPIC HARVESTING OF RIGHT SAPHENOUS VEIN,  LIMA-LAD SVG-RAMUS SEQ SVG-PD-PL (N/A) TRANSESOPHAGEAL ECHOCARDIOGRAM (TEE) (N/A)  1. CV- NSR, BP remains too low for ARB, continue Lopressor at current dose 2. Pulm- no acute issues, off oxygen, continue IS 3. Renal- creatinine has been stable, weight is mildly elevated but stable, has been hyponatremic and lasix is not indicated at this time 4. DM- sugars well controlled 5. Dispo- patient stable, will d/c home today if okay with Dr. Dorris Fetch   LOS: 9 days    Lowella Dandy 02/01/2017 Patient seen and examined, agree with above Dc home today  Viviann Spare C. Dorris Fetch, MD [facility] [phone]

## 2017-02-01 NOTE — Progress Notes (Signed)
Discussed with the patient and all questioned fully answered. He will call me if any problems arise. IV removed. Telemetry removed, CCMD notified. Sutures removed per protocol. Patient given paper prescriptions including Oxycodone. Pt verbalized understanding of all discharge instructions.   Pt states daughter cannot pick him up until after 3 pm. Will discuss with patient possibility of discharge lounge.   Leonidas Romberg, RN

## 2017-02-05 ENCOUNTER — Ambulatory Visit: Payer: BLUE CROSS/BLUE SHIELD | Admitting: Physician Assistant

## 2017-02-05 ENCOUNTER — Telehealth (HOSPITAL_COMMUNITY): Payer: Self-pay

## 2017-02-05 NOTE — Telephone Encounter (Signed)
Patient insurance is active and benefits verified. Patient insurance is [employer] - no co-payment, deductible $900/$900 has been met, out of pocket $4500/$1162.76 has been met, 25% co-insurance and no pre-authorization. Passport/reference [phone]. Jarrett Soho verified insurance.   Patient will be contacted and scheduled after their follow appointment with the cardiologist on 02/18/17 and surgeon on 03/04/17, upon review by [facility] RN navigator.

## 2017-02-18 ENCOUNTER — Ambulatory Visit (INDEPENDENT_AMBULATORY_CARE_PROVIDER_SITE_OTHER): Payer: BLUE CROSS/BLUE SHIELD | Admitting: Physician Assistant

## 2017-02-18 ENCOUNTER — Encounter: Payer: Self-pay | Admitting: Physician Assistant

## 2017-02-18 VITALS — BP 102/64 | HR 78 | Ht 72.0 in | Wt 282.8 lb

## 2017-02-18 DIAGNOSIS — Z951 Presence of aortocoronary bypass graft: Secondary | ICD-10-CM

## 2017-02-18 DIAGNOSIS — E782 Mixed hyperlipidemia: Secondary | ICD-10-CM | POA: Diagnosis not present

## 2017-02-18 DIAGNOSIS — E118 Type 2 diabetes mellitus with unspecified complications: Secondary | ICD-10-CM

## 2017-02-18 NOTE — Patient Instructions (Signed)
Your physician recommends that you continue on your current medications as directed. Please refer to the Current Medication list given to you today. Your physician recommends that you return for lab work in: 4-6 weeks (fasting liver and lipid panel) Your physician recommends that you schedule a follow-up appointment in: 2 months with Dr. Delton See.

## 2017-02-18 NOTE — Progress Notes (Signed)
Cardiology Office Note    Date:  02/18/2017   ID:  Joshua Howell, DOB 09/04/1960, MRN [id number]  PCP:  Jac Canavan, PA-C  Cardiologist: Dr. Delton See  Chief Complaint  Patient presents with  . Follow-up    History of Present Illness:  Joshua Howell is a 56 y.o. male with CAD S/P NSTEMI 01/23/17 who underwent CABG 4, 01/28/17 with LIMA to the LAD, SVG to the ramus intermediate, sequential SVG to PDA and PLVB. Patient also has hypertension, type 2 diabetes mellitus, family history of CAD. He had normal LV function EF 55-60% at cath.  Patient comes in today accompanied by his daughter. He says he doesn't sit still and is walking all the time. He walked a half mile yesterday. He denies chest pain, dyspnea, dyspnea on exertion, palpitations or edema. He is not interested in cardiac rehabilitation.     Past Medical History:  Diagnosis Date  . Elevated blood-pressure reading without diagnosis of hypertension   . Hepatitis B   . Impaired fasting blood sugar   . Migraine    "only in [employer]" (01/23/2017)  . Morbid obesity (HCC)   . NSTEMI (non-ST elevated myocardial infarction) (HCC) 01/23/2017  . Type I diabetes mellitus (HCC) 07/2016    Past Surgical History:  Procedure Laterality Date  . ANTERIOR CERVICAL DECOMP/DISCECTOMY FUSION     "took a piece off my right hip hip & put it in there"  . BACK SURGERY    . CORONARY ARTERY BYPASS GRAFT N/A 01/28/2017   Procedure: CORONARY ARTERY BYPASS GRAFTING (CABG ) x 4 USING LEFT INTERNAL MAMMARY ARTERY AND ENDOSCOPIC HARVESTING OF RIGHT SAPHENOUS VEIN,  LIMA-LAD SVG-RAMUS SEQ SVG-PD-PL;  Surgeon: [name], MD;  Location: [facility] OR;  Service: Open Heart Surgery;  Laterality: N/A;  . LEFT HEART CATH AND CORONARY ANGIOGRAPHY N/A 01/24/2017   Procedure: LEFT HEART CATH AND CORONARY ANGIOGRAPHY;  Surgeon: Corky Crafts, MD;  Location: [facility] INVASIVE CV LAB;  Service: Cardiovascular;  Laterality: N/A;  . TEE  WITHOUT CARDIOVERSION N/A 01/28/2017   Procedure: TRANSESOPHAGEAL ECHOCARDIOGRAM (TEE);  Surgeon: [name], MD;  Location: [facility] OR;  Service: Open Heart Surgery;  Laterality: N/A;    Current Medications: Current Meds  Medication Sig  . aspirin EC 325 MG EC tablet Take 1 tablet (325 mg total) by mouth daily.  Marland Kitchen atorvastatin (LIPITOR) 80 MG tablet Take 1 tablet (80 mg total) by mouth daily at 6 PM.  . DiphenhydrAMINE HCl (BENADRYL ALLERGY PO) Take 25 mg by mouth as needed (allergies).   . metFORMIN (GLUCOPHAGE) 500 MG tablet Take 1 tablet (500 mg total) by mouth 2 (two) times daily with a meal.  . metoprolol tartrate (LOPRESSOR) 25 MG tablet Take 0.5 tablets (12.5 mg total) by mouth 2 (two) times daily.  . Naproxen Sodium (ALEVE PO) Take by mouth as directed.     Allergies:   Patient has no known allergies.   Social History   Social History  . Marital status: Widowed    Spouse name: N/A  . Number of children: N/A  . Years of education: N/A   Social History Main Topics  . Smoking status: Former Smoker    Types: Cigarettes  . Smokeless tobacco: Never Used     Comment: 01/23/2017 "quit smoking in the 1990s"  . Alcohol use 0.0 oz/week     Comment: 01/23/2017 "maybe 4 beers/month"  . Drug use: No  . Sexual activity: Not Currently   Other  Topics Concern  . None   Social History Narrative  . None     Family History:  The patient's family history includes Aneurysm in his mother; Gallbladder disease in his mother; Heart disease (age of onset: 28) in his brother; Heart disease (age of onset: 71) in his father; Other in his brother; Stroke in his mother.   ROS:   Please see the history of present illness.    Review of Systems  Constitution: Negative.  HENT: Negative.   Cardiovascular: Negative.   Respiratory: Negative.   Endocrine: Negative.   Hematologic/Lymphatic: Negative.   Musculoskeletal: Negative.   Gastrointestinal: Negative.   Genitourinary: Negative.     Neurological: Negative.    All other systems reviewed and are negative.   PHYSICAL EXAM:   VS:  BP 102/64   Pulse 78   Ht 6' (1.829 m)   Wt 282 lb 12.8 oz (128.3 kg)   SpO2 95%   BMI 38.35 kg/m   Physical Exam  GEN: Well nourished, well developed, in no acute distress  Neck: no JVD, carotid bruits, or masses Cardiac:RRR; no murmurs, rubs, or gallops  Respiratory:  clear to auscultation bilaterally, normal work of breathing GI: soft, nontender, nondistended, + BS Ext: without cyanosis, clubbing, or edema, Good distal pulses bilaterally Neuro:  Alert and Oriented x 3 Psych: euthymic mood, full affect  Wt Readings from Last 3 Encounters:  02/18/17 282 lb 12.8 oz (128.3 kg)  02/01/17 284 lb 1.6 oz (128.9 kg)  01/23/17 290 lb 12.8 oz (131.9 kg)      Studies/Labs Reviewed:   EKG:  EKG is ordered today.  The ekg ordered today demonstrates  Normal sinus rhythm with nonspecific ST-T wave changes, no acute change  Recent Labs: 01/24/2017: TSH 3.112 01/26/2017: ALT 22 01/29/2017: Magnesium 2.1 01/31/2017: BUN 11; Creatinine, Ser 0.97; Hemoglobin 13.0; Platelets 117; Potassium 4.4; Sodium 132   Lipid Panel    Component Value Date/Time   CHOL 162 01/24/2017 0640   TRIG 130 01/24/2017 0640   HDL 43 01/24/2017 0640   CHOLHDL 3.8 01/24/2017 0640   VLDL 26 01/24/2017 0640   LDLCALC 93 01/24/2017 0640    Additional studies/ records that were reviewed today include:  Cardiac cath 01/24/17   Mid RCA lesion, 80 %stenosed. Proximal vessel is ectatic.  Post Atrio lesion, 100 %stenosed.  Ramus lesion, 95 %stenosed.  Ost LAD to Prox LAD lesion, 90 %stenosed.  The left ventricular systolic function is normal.  LV end diastolic pressure is normal.  The left ventricular ejection fraction is 55-65% by visual estimate.  There is no aortic valve stenosis.   2-D echo 9/13/18Study Conclusions   - Left ventricle: The cavity size was normal. Systolic function was   normal. The  estimated ejection fraction was in the range of 60%   to 65%. Wall motion was normal; there were no regional wall   motion abnormalities. - Right atrium: The atrium was mildly dilated.   Carotid Dopplers 9/14/18Summary:  - Findings are consistent with a 1-39 percent stenosis involving   the right internal carotid artery and the left internal carotid   artery. The vertebral arteries demonstrate antegrade flow. - Right ABI of 1.07 and left ABI of 1.01 are suggestive of arterial   flow within normal limits at rest.  Prepared and Electronically Authenticated by  Gretta Began, MD 2018-09-15T10:38:07    ASSESSMENT:    1. S/P CABG x 4   2. Mixed dyslipidemia   3. Morbid obesity (HCC)  4. Diabetes mellitus with complication (HCC)      PLAN:  In order of problems listed above:  CAD status post NSTEMI and CABG x4 01/28/17. Patient doing well. Continue aspirin and Lipitor and low-dose metoprolol. Patient is not interested in cardiac rehabilitation. Follow-up with Dr. Delton See in 2 months.  Mixed dyslipidemia continue Lipitor and check fasting lipid panel and LFTs in 4-6 weeks  Morbid obesity exercise and weight loss program recommended  Diabetes mellitus on metformin made by primary care     Medication Adjustments/Labs and Tests Ordered: Current medicines are reviewed at length with the patient today.  Concerns regarding medicines are outlined above.  Medication changes, Labs and Tests ordered today are listed in the Patient Instructions below. There are no Patient Instructions on file for this visit.   Elson Clan, PA-C  02/18/2017 11:25 AM    [facility] [address] [place], [place], [place]  [address] Phone: [phone]; Fax: [phone]

## 2017-03-01 ENCOUNTER — Other Ambulatory Visit: Payer: Self-pay | Admitting: Thoracic Surgery (Cardiothoracic Vascular Surgery)

## 2017-03-01 DIAGNOSIS — Z951 Presence of aortocoronary bypass graft: Secondary | ICD-10-CM

## 2017-03-04 ENCOUNTER — Ambulatory Visit (INDEPENDENT_AMBULATORY_CARE_PROVIDER_SITE_OTHER): Payer: Self-pay | Admitting: Physician Assistant

## 2017-03-04 ENCOUNTER — Ambulatory Visit
Admission: RE | Admit: 2017-03-04 | Discharge: 2017-03-04 | Disposition: A | Discharge status: Home / Self Care | Payer: BLUE CROSS/BLUE SHIELD | Source: Ambulatory Visit | Attending: Thoracic Surgery (Cardiothoracic Vascular Surgery) | Admitting: Thoracic Surgery (Cardiothoracic Vascular Surgery)

## 2017-03-04 VITALS — BP 114/68 | HR 76 | Ht 72.0 in | Wt 284.0 lb

## 2017-03-04 DIAGNOSIS — J939 Pneumothorax, unspecified: Secondary | ICD-10-CM | POA: Diagnosis not present

## 2017-03-04 DIAGNOSIS — Z951 Presence of aortocoronary bypass graft: Secondary | ICD-10-CM

## 2017-03-04 NOTE — Progress Notes (Signed)
HPI: Patient returns for routine postoperative follow-up having undergone CABG x 4  on 01/28/2017.  The patient's early postoperative recovery while in the hospital was unremarkable.  Since hospital discharge the patient reports he is doing great.  He states that he isn't sitting still.  He is ambulating without any difficulty and at times, up to 1/2 mile at a time.  He denies chest pain, shortness of breath, and palpitations.  He states his incisions have done well, except for his Left leg incision, which has a hardened lump under it.  He has resumed driving and is not on narcotic pain medications.  He would like to return to work.     Current Outpatient Prescriptions  Medication Sig Dispense Refill  . aspirin EC 325 MG EC tablet Take 1 tablet (325 mg total) by mouth daily. 30 tablet 0  . atorvastatin (LIPITOR) 80 MG tablet Take 1 tablet (80 mg total) by mouth daily at 6 PM. 30 tablet 3  . DiphenhydrAMINE HCl (BENADRYL ALLERGY PO) Take 25 mg by mouth as needed (allergies).     . metFORMIN (GLUCOPHAGE) 500 MG tablet Take 1 tablet (500 mg total) by mouth 2 (two) times daily with a meal. 180 tablet 2  . metoprolol tartrate (LOPRESSOR) 25 MG tablet Take 0.5 tablets (12.5 mg total) by mouth 2 (two) times daily. 60 tablet 3  . Naproxen Sodium (ALEVE PO) Take by mouth as directed.     No current facility-administered medications for this visit.     Physical Exam:  BP 114/68   Pulse 76   Ht 6' (1.829 m)   Wt 284 lb (128.8 kg)   BMI 38.52 kg/m   Gen: no apparent distress Heart; RRR Lungs: CTA bilaterally Abd; soft non-tender non distended Ext: no edema Incisions- sternotomy, RLE EVH site well healed.... Left EVH site is hardened underneath incision, there is no infection present, but there is a 3mm opening along the superior end  Diagnostic Tests:  CXR: resolution of previous pneumothorax on the left, no pleural effusions, sternal wires intact  A/P:  1. CV- hemodynamically stable- doing  very well post CABG, HR, BP well controlled 2. Left EVH site- appears to be opening due to pressure from hematoma underneath. There is no infection present.. This should heal with time.  Patient instructed to wash incision with soap and water daily, place a small amount of ABX ointment and cover with clean dressing daily 3. Activity- continue current level, patient again educated on importance of observing lifting restrictions 4. RTW slip provided with modifications for lifting ability.  I stressed the importance of observing these rules as if he would break his sternal wires, he would likely require a sternal re-wire and his precautions would start over.  He understands the risk and states he does not have to lift at work 5. RTC prn  Lowella DandyBARRETT, Stevie Ertle, PA-C [facility] [phone]

## 2017-03-18 ENCOUNTER — Other Ambulatory Visit: Payer: BLUE CROSS/BLUE SHIELD | Admitting: *Deleted

## 2017-03-18 DIAGNOSIS — Z951 Presence of aortocoronary bypass graft: Secondary | ICD-10-CM

## 2017-03-18 DIAGNOSIS — E118 Type 2 diabetes mellitus with unspecified complications: Secondary | ICD-10-CM

## 2017-03-18 DIAGNOSIS — E782 Mixed hyperlipidemia: Secondary | ICD-10-CM

## 2017-03-18 LAB — HEPATIC FUNCTION PANEL
ALT: 15 IU/L (ref 0–44)
AST: 20 IU/L (ref 0–40)
Albumin: 4.1 g/dL (ref 3.5–5.5)
Alkaline Phosphatase: 68 IU/L (ref 39–117)
BILIRUBIN TOTAL: 0.9 mg/dL (ref 0.0–1.2)
Bilirubin, Direct: 0.28 mg/dL (ref 0.00–0.40)
Total Protein: 6.8 g/dL (ref 6.0–8.5)

## 2017-03-18 LAB — LIPID PANEL
CHOL/HDL RATIO: 2.3 ratio (ref 0.0–5.0)
Cholesterol, Total: 84 mg/dL — ABNORMAL LOW (ref 100–199)
HDL: 37 mg/dL — AB (ref >39)
LDL Calculated: 24 mg/dL (ref 0–99)
TRIGLYCERIDES: 117 mg/dL (ref 0–149)
VLDL Cholesterol Cal: 23 mg/dL (ref 5–40)

## 2017-03-19 ENCOUNTER — Telehealth: Payer: Self-pay

## 2017-03-19 NOTE — Telephone Encounter (Signed)
Spoke with patient about recent lab results. patient verbalized understanding. patient thanked me for my call.

## 2017-03-19 NOTE — Telephone Encounter (Signed)
-----   Message from Jacqlyn KraussAnne M Lankford, RN sent at 03/19/2017  7:52 AM EST ----- To CMA [employer]

## 2017-04-03 ENCOUNTER — Telehealth: Payer: Self-pay

## 2017-04-03 ENCOUNTER — Other Ambulatory Visit: Payer: Self-pay

## 2017-04-03 MED ORDER — ATORVASTATIN CALCIUM 80 MG PO TABS: 80.0000 mg | ORAL_TABLET | Freq: Every day | ORAL | 3 refills | Status: DC

## 2017-04-03 MED ORDER — METOPROLOL TARTRATE 25 MG PO TABS: 12.5000 mg | ORAL_TABLET | Freq: Two times a day (BID) | ORAL | 3 refills | Status: DC

## 2017-04-03 NOTE — Telephone Encounter (Signed)
Patient daughter, [place]on [place]ina [place], called to get medications refilled by pharmacy. Insurance company told her that if she picked up from local pharmacy she will have to pay out of pocket cost. Patient [facility] not yet [facility] yet and requested for me to [facility]. I gave her the [facility] number and told her to call back once office opens Monday after the holidays if she still need help. She was appreciative of the help given thus far.

## 2017-04-03 NOTE — Telephone Encounter (Signed)
Patients daughter Trula OreChristina called and states that she set up [facility] in order for patient to be able to get atorvastatin and for us to cancel the order sent into her local pharmacy and [facility].  [facility] was [facility] and cancelled at local pharmacy.

## 2017-04-20 DIAGNOSIS — J0100 Acute maxillary sinusitis, unspecified: Secondary | ICD-10-CM | POA: Diagnosis not present

## 2017-06-17 ENCOUNTER — Other Ambulatory Visit: Payer: Self-pay | Admitting: Cardiology

## 2017-06-17 MED ORDER — METOPROLOL TARTRATE 25 MG PO TABS: 12.5000 mg | ORAL_TABLET | Freq: Two times a day (BID) | ORAL | 2 refills | Status: DC

## 2017-07-08 ENCOUNTER — Ambulatory Visit: Payer: BLUE CROSS/BLUE SHIELD | Admitting: Physician Assistant

## 2017-07-08 ENCOUNTER — Encounter (INDEPENDENT_AMBULATORY_CARE_PROVIDER_SITE_OTHER): Payer: Self-pay

## 2017-07-08 ENCOUNTER — Encounter: Payer: Self-pay | Admitting: Physician Assistant

## 2017-07-08 VITALS — BP 132/72 | HR 85 | Ht 72.0 in | Wt 286.0 lb

## 2017-07-08 DIAGNOSIS — Z951 Presence of aortocoronary bypass graft: Secondary | ICD-10-CM | POA: Diagnosis not present

## 2017-07-08 DIAGNOSIS — E118 Type 2 diabetes mellitus with unspecified complications: Secondary | ICD-10-CM | POA: Diagnosis not present

## 2017-07-08 DIAGNOSIS — E782 Mixed hyperlipidemia: Secondary | ICD-10-CM | POA: Diagnosis not present

## 2017-07-08 MED ORDER — ASPIRIN EC 81 MG PO TBEC: 81.0000 mg | DELAYED_RELEASE_TABLET | Freq: Every day | ORAL | 3 refills | Status: DC

## 2017-07-08 NOTE — Patient Instructions (Addendum)
Medication Instructions:  Your physician has recommended you make the following change in your medication:  1.  DECREASE the Aspirin to 81 mg taking 1 tablet daily   Labwork: None ordered  Testing/Procedures: None ordered  Follow-Up: Your physician wants you to follow-up in: 6 MONTHS WITH DR. Johnell ComingsNELSON   You will receive a reminder letter in the mail two months in advance. If you don't receive a letter, please call our office to schedule the follow-up appointment.   Any Other Special Instructions Will Be Listed Below (If Applicable).     If you need a refill on your cardiac medications before your next appointment, please call your pharmacy.

## 2017-07-08 NOTE — Progress Notes (Signed)
Cardiology Office Note    Date:  07/08/2017   ID:  Joshua Howell, DOB 08-Jun-1960, MRN [id number]  PCP:  Jac Canavan, PA-C  Cardiologist: Tobias Alexander, MD  No chief complaint on file.   History of Present Illness:  Joshua Howell is a 57 y.o. male with CAD S/P NSTEMI 01/23/17 who underwent CABG 4, 01/28/17 with LIMA to the LAD, SVG to the ramus intermediate, sequential SVG to PDA and PLVB. Patient also has hypertension, type 2 diabetes mellitus, family history of CAD. He had normal LV function EF 55-60% at cath.  Patient 02/18/17 which time he was doing well post CABG.  Follow-up lipid panel 03/2017 LDL was 24.  Patient comes in today for follow-up.  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  He denies any chest pain, palpitations, dyspnea, dyspnea on exertion, dizziness or presyncope.  His weight is up 4 pounds.  He says his girlfriend is on the [employer] and he does eat that when he is with her.  He says he will lose weight when the warmer weather starts.     Past Medical History:  Diagnosis Date  . Elevated blood-pressure reading without diagnosis of hypertension   . Hepatitis B   . Impaired fasting blood sugar   . Migraine    "only in [employer]" (01/23/2017)  . Morbid obesity (HCC)   . NSTEMI (non-ST elevated myocardial infarction) (HCC) 01/23/2017  . Type I diabetes mellitus (HCC) 07/2016    Past Surgical History:  Procedure Laterality Date  . ANTERIOR CERVICAL DECOMP/DISCECTOMY FUSION     "took a piece off my right hip hip & put it in there"  . BACK SURGERY    . CORONARY ARTERY BYPASS GRAFT N/A 01/28/2017   Procedure: CORONARY ARTERY BYPASS GRAFTING (CABG ) x 4 USING LEFT INTERNAL MAMMARY ARTERY AND ENDOSCOPIC HARVESTING OF RIGHT SAPHENOUS VEIN,  LIMA-LAD SVG-RAMUS SEQ SVG-PD-PL;  Surgeon: [name], MD;  Location: [facility] OR;  Service: Open Heart Surgery;  Laterality: N/A;  . LEFT HEART CATH AND  CORONARY ANGIOGRAPHY N/A 01/24/2017   Procedure: LEFT HEART CATH AND CORONARY ANGIOGRAPHY;  Surgeon: Corky Crafts, MD;  Location: [facility] INVASIVE CV LAB;  Service: Cardiovascular;  Laterality: N/A;  . TEE WITHOUT CARDIOVERSION N/A 01/28/2017   Procedure: TRANSESOPHAGEAL ECHOCARDIOGRAM (TEE);  Surgeon: [name], MD;  Location: [facility] OR;  Service: Open Heart Surgery;  Laterality: N/A;    Current Medications: Current Meds  Medication Sig  . atorvastatin (LIPITOR) 80 MG tablet Take 1 tablet (80 mg total) by mouth daily at 6 PM.  . DiphenhydrAMINE HCl (BENADRYL ALLERGY PO) Take 25 mg by mouth as needed (allergies).   . metFORMIN (GLUCOPHAGE) 500 MG tablet Take 1 tablet (500 mg total) by mouth 2 (two) times daily with a meal.  . metoprolol tartrate (LOPRESSOR) 25 MG tablet Take 0.5 tablets (12.5 mg total) by mouth 2 (two) times daily.  . Naproxen Sodium (ALEVE PO) Take by mouth as directed.  . [DISCONTINUED] aspirin EC 325 MG EC tablet Take 1 tablet (325 mg total) by mouth daily.     Allergies:   Patient has no known allergies.   Social History   Socioeconomic History  . Marital status: Widowed    Spouse name: None  . Number of children: None  . Years of education: None  . Highest education level: None  Social Needs  . Financial resource strain: None  .  Food insecurity - worry: None  . Food insecurity - inability: None  . Transportation needs - medical: None  . Transportation needs - non-medical: None  Occupational History  . None  Tobacco Use  . Smoking status: Former Smoker    Types: Cigarettes  . Smokeless tobacco: Never Used  . Tobacco comment: 01/23/2017 "quit smoking in the 1990s"  Substance and Sexual Activity  . Alcohol use: Yes    Alcohol/week: 0.0 oz    Comment: 01/23/2017 "maybe 4 beers/month"  . Drug use: No  . Sexual activity: Not Currently  Other Topics Concern  . None  Social History Narrative  . None     Family History:  The patient's family  history includes Aneurysm in his mother; Gallbladder disease in his mother; Heart disease (age of onset: 6845) in his brother; Heart disease (age of onset: 6855) in his father; Other in his brother; Stroke in his mother.   ROS:   Please see the history of present illness.    Review of Systems  Constitution: Negative.  HENT: Negative.   Cardiovascular: Negative.   Respiratory: Negative.   Endocrine: Negative.   Hematologic/Lymphatic: Negative.   Musculoskeletal: Negative.   Gastrointestinal: Negative.   Genitourinary: Negative.   Neurological: Negative.    All other systems reviewed and are negative.   PHYSICAL EXAM:   VS:  BP 132/72   Pulse 85   Ht 6' (1.829 m)   Wt 286 lb (129.7 kg)   SpO2 95%   BMI 38.79 kg/m   Physical Exam  GEN: Obese, in no acute distress  Neck: no JVD, carotid bruits, or masses Cardiac: Distant heart sounds RRR; no murmurs, rubs, or gallops  Respiratory:  clear to auscultation bilaterally, normal work of breathing GI: soft, nontender, nondistended, + BS Ext: without cyanosis, clubbing, or edema, Good distal pulses bilaterally Neuro:  Alert and Oriented x 3, Psych: euthymic mood, full affect  Wt Readings from Last 3 Encounters:  07/08/17 286 lb (129.7 kg)  03/04/17 284 lb (128.8 kg)  02/18/17 282 lb 12.8 oz (128.3 kg)      Studies/Labs Reviewed:   EKG:  EKG is not ordered today.   Recent Labs: 01/24/2017: TSH 3.112 01/29/2017: Magnesium 2.1 01/31/2017: BUN 11; Creatinine, Ser 0.97; Hemoglobin 13.0; Platelets 117; Potassium 4.4; Sodium 132 03/18/2017: ALT 15   Lipid Panel    Component Value Date/Time   CHOL 84 (L) 03/18/2017 1020   TRIG 117 03/18/2017 1020   HDL 37 (L) 03/18/2017 1020   CHOLHDL 2.3 03/18/2017 1020   CHOLHDL 3.8 01/24/2017 0640   VLDL 26 01/24/2017 0640   LDLCALC 24 03/18/2017 1020    Additional studies/ records that were reviewed today include:   Cardiac cath 01/24/17    Mid RCA lesion, 80 %stenosed. Proximal vessel is  ectatic.  Post Atrio lesion, 100 %stenosed.  Ramus lesion, 95 %stenosed.  Ost LAD to Prox LAD lesion, 90 %stenosed.  The left ventricular systolic function is normal.  LV end diastolic pressure is normal.  The left ventricular ejection fraction is 55-65% by visual estimate.  There is no aortic valve stenosis.   2-D echo 9/13/18Study Conclusions   - Left ventricle: The cavity size was normal. Systolic function was   normal. The estimated ejection fraction was in the range of 60%   to 65%. Wall motion was normal; there were no regional wall   motion abnormalities. - Right atrium: The atrium was mildly dilated.   Carotid Dopplers 9/14/18Summary:  - Findings  are consistent with a 1-39 percent stenosis involving   the right internal carotid artery and the left internal carotid   artery. The vertebral arteries demonstrate antegrade flow. - Right ABI of 1.07 and left ABI of 1.01 are suggestive of arterial   flow within normal limits at rest.  Prepared and Electronically Authenticated by  Gretta Began, MD 2018-09-15T10:38:07        ASSESSMENT:    1. S/P CABG x 4   2. Mixed dyslipidemia   3. Morbid obesity (HCC)   4. Diabetes mellitus with complication (HCC)      PLAN:  In order of problems listed above:  Status post CABG x4, 01/28/17 doing well.  Back to work full-time.  We will decrease aspirin to 81 mg daily, continue Lipitor and metoprolol.  For follow-up with Dr. Delton See in 6 months.  Mixed dyslipidemia on Lipitor.  Fasting lipid panel and LFTs were checked in November and LDL was 24.  Morbid obesity weight loss program discussed with patient.  Diabetes mellitus on metformin.  Recent hemoglobin A1c was 6.7 according to the patient    Medication Adjustments/Labs and Tests Ordered: Current medicines are reviewed at length with the patient today.  Concerns regarding medicines are outlined above.  Medication changes, Labs and Tests ordered today are listed in the  Patient Instructions below. Patient Instructions  Medication Instructions:  Your physician has recommended you make the following change in your medication:  1.  DECREASE the Aspirin to 81 mg taking 1 tablet daily   Labwork: None ordered  Testing/Procedures: None ordered  Follow-Up: Your physician wants you to follow-up in: 6 MONTHS WITH DR. Johnell Comings will receive a reminder letter in the mail two months in advance. If you don't receive a letter, please call our office to schedule the follow-up appointment.   Any Other Special Instructions Will Be Listed Below (If Applicable).     If you need a refill on your cardiac medications before your next appointment, please call your pharmacy.      Signed, Jacolyn Reedy, PA-C  07/08/2017 1:51 PM    [facility] [address] [place], [place], [place]  [address] Phone: [phone]; Fax: [phone]

## 2017-10-12 ENCOUNTER — Other Ambulatory Visit: Payer: Self-pay | Admitting: Medical

## 2017-10-12 DIAGNOSIS — E1165 Type 2 diabetes mellitus with hyperglycemia: Secondary | ICD-10-CM

## 2017-10-12 DIAGNOSIS — E118 Type 2 diabetes mellitus with unspecified complications: Principal | ICD-10-CM

## 2017-10-12 DIAGNOSIS — IMO0002 Reserved for concepts with insufficient information to code with codable children: Secondary | ICD-10-CM

## 2017-10-14 ENCOUNTER — Other Ambulatory Visit: Payer: Self-pay | Admitting: Medical

## 2017-10-14 DIAGNOSIS — E1165 Type 2 diabetes mellitus with hyperglycemia: Secondary | ICD-10-CM

## 2017-10-14 DIAGNOSIS — E118 Type 2 diabetes mellitus with unspecified complications: Principal | ICD-10-CM

## 2017-10-14 DIAGNOSIS — IMO0002 Reserved for concepts with insufficient information to code with codable children: Secondary | ICD-10-CM

## 2017-10-14 NOTE — Telephone Encounter (Signed)
Pt needs an appt

## 2017-10-14 NOTE — Telephone Encounter (Signed)
Tried to call pt but number listed is not the right number for pt. I will deny med

## 2017-10-18 ENCOUNTER — Other Ambulatory Visit: Payer: Self-pay

## 2017-10-18 ENCOUNTER — Telehealth: Payer: Self-pay | Admitting: Medical

## 2017-10-18 DIAGNOSIS — E118 Type 2 diabetes mellitus with unspecified complications: Principal | ICD-10-CM

## 2017-10-18 DIAGNOSIS — IMO0002 Reserved for concepts with insufficient information to code with codable children: Secondary | ICD-10-CM

## 2017-10-18 DIAGNOSIS — E1165 Type 2 diabetes mellitus with hyperglycemia: Secondary | ICD-10-CM

## 2017-10-18 MED ORDER — METFORMIN HCL 500 MG PO TABS: 500.0000 mg | ORAL_TABLET | Freq: Two times a day (BID) | ORAL | 0 refills | Status: DC

## 2017-10-18 NOTE — Telephone Encounter (Signed)
Is this refill appropriate since patient has an appt scheduled?

## 2017-10-18 NOTE — Telephone Encounter (Signed)
Pt called and made an appt for 06/12. Pt is requested refills on Metformin since that appt has been made. [facility]

## 2017-10-23 ENCOUNTER — Encounter: Payer: Self-pay | Admitting: Medical

## 2017-10-23 ENCOUNTER — Ambulatory Visit: Payer: BLUE CROSS/BLUE SHIELD | Admitting: Medical

## 2017-10-23 VITALS — BP 128/70 | HR 59 | Temp 97.9°F | Ht 72.0 in | Wt 294.0 lb

## 2017-10-23 DIAGNOSIS — Z951 Presence of aortocoronary bypass graft: Secondary | ICD-10-CM

## 2017-10-23 DIAGNOSIS — Z8249 Family history of ischemic heart disease and other diseases of the circulatory system: Secondary | ICD-10-CM

## 2017-10-23 DIAGNOSIS — N529 Male erectile dysfunction, unspecified: Secondary | ICD-10-CM

## 2017-10-23 DIAGNOSIS — E1165 Type 2 diabetes mellitus with hyperglycemia: Secondary | ICD-10-CM

## 2017-10-23 DIAGNOSIS — E782 Mixed hyperlipidemia: Secondary | ICD-10-CM | POA: Diagnosis not present

## 2017-10-23 DIAGNOSIS — E118 Type 2 diabetes mellitus with unspecified complications: Secondary | ICD-10-CM

## 2017-10-23 DIAGNOSIS — IMO0002 Reserved for concepts with insufficient information to code with codable children: Secondary | ICD-10-CM

## 2017-10-23 DIAGNOSIS — I214 Non-ST elevation (NSTEMI) myocardial infarction: Secondary | ICD-10-CM | POA: Diagnosis not present

## 2017-10-23 LAB — COMPREHENSIVE METABOLIC PANEL
A/G RATIO: 1.5 (ref 1.2–2.2)
ALBUMIN: 4.2 g/dL (ref 3.5–5.5)
ALT: 16 IU/L (ref 0–44)
AST: 17 IU/L (ref 0–40)
Alkaline Phosphatase: 74 IU/L (ref 39–117)
BUN / CREAT RATIO: 12 (ref 9–20)
BUN: 12 mg/dL (ref 6–24)
Bilirubin Total: 0.9 mg/dL (ref 0.0–1.2)
CHLORIDE: 103 mmol/L (ref 96–106)
CO2: 24 mmol/L (ref 20–29)
Calcium: 9.1 mg/dL (ref 8.7–10.2)
Creatinine, Ser: 0.97 mg/dL (ref 0.76–1.27)
GFR calc non Af Amer: 85 mL/min/{1.73_m2} (ref >59)
GFR, EST AFRICAN AMERICAN: 98 mL/min/{1.73_m2} (ref >59)
Globulin, Total: 2.8 g/dL (ref 1.5–4.5)
Glucose: 124 mg/dL — ABNORMAL HIGH (ref 65–99)
POTASSIUM: 4.8 mmol/L (ref 3.5–5.2)
Sodium: 139 mmol/L (ref 134–144)
TOTAL PROTEIN: 7 g/dL (ref 6.0–8.5)

## 2017-10-23 LAB — HEMOGLOBIN A1C
Est. average glucose Bld gHb Est-mCnc: 160 mg/dL
Hgb A1c MFr Bld: 7.2 % — ABNORMAL HIGH (ref 4.8–5.6)

## 2017-10-23 NOTE — Progress Notes (Signed)
  Subjective:   Joshua Howell is an 57 y.o. male who presents for follow up  Diabetes type 2: Patient is checking home blood sugars.   Home blood sugar records: checking some, getting good readings Current symptoms include: none.   Patient is checking their feet daily. Foot concerns: none Last dilated eye exam due now.  Dentist- not going regularly.    Current treatments: metformin. Medication compliance: good  Current diet: hit or miss Current exercise: walking  Hyperlipidemia - compliant with Lipitor 80mg  and ASA 81mg  daily  MI and s/p CABG 2018, on metoprolol  Has had ED issues since starting these medications s/p MI  The following portions of the patient's history were reviewed and updated as appropriate: allergies, current medications, past family history, past medical history, past social history, past surgical history and problem list.  ROS as in subjective above    Objective:   BP 128/70   Pulse (!) 59   Temp 97.9 F (36.6 C) (Oral)   Ht 6' (1.829 m)   Wt 294 lb (133.4 kg)   SpO2 97%   BMI 39.87 kg/m   Wt Readings from Last 3 Encounters:  10/23/17 294 lb (133.4 kg)  07/08/17 286 lb (129.7 kg)  03/04/17 284 lb (128.8 kg)    BP Readings from Last 3 Encounters:  10/23/17 128/70  07/08/17 132/72  03/04/17 114/68    General appearance: alert, no distress, WD/WN Oral cavity: MMM, no lesions Neck: supple, no lymphadenopathy, no thyromegaly, no masses Heart: RRR, normal S1, S2, no murmurs Lungs: CTA bilaterally, no wheezes, rhonchi, or rales Pulses: 2+ symmetric, upper and lower extremities, normal cap refill Ext: no edema    Assessment:   Encounter Diagnoses  Name Primary?  Marland Kitchen. Uncontrolled type 2 diabetes mellitus with complication, without long-term current use of insulin (HCC)   . Diabetes mellitus with complication (HCC) Yes  . NSTEMI (non-ST elevated myocardial infarction) (HCC)   . Mixed dyslipidemia   . Family history of premature CAD    . S/P CABG x 4   . Erectile dysfunction, unspecified erectile dysfunction type      Plan:   Diabetes Mellitus type 2: Labs today Vaccines counseling given.   Advised yearly eye doctor visit for retinopathy screening. Advised daily foot check  Education: Reviewed 'ABCs' of diabetes management (respective goals in parentheses):  A1C (<7), blood pressure (<130/80), and cholesterol (LDL <100)  ED - discuss with cardiology next visit.   An ACE/ARB is not currently part of their treatment regimen.   Is taking metoprolol but having ED concerns.   I asked him to discussed with cardiology at f/u.  A statin is currently part of their treatment regimen.   Joshua Howell was seen today for medication management.  Diagnoses and all orders for this visit:  Diabetes mellitus with complication (HCC) -     Hemoglobin A1c  Uncontrolled type 2 diabetes mellitus with complication, without long-term current use of insulin (HCC) -     Hemoglobin A1c  NSTEMI (non-ST elevated myocardial infarction) (HCC)  Mixed dyslipidemia -     Comprehensive metabolic panel  Family history of premature CAD  S/P CABG x 4  Erectile dysfunction, unspecified erectile dysfunction type   Follow up pending labs

## 2017-10-24 ENCOUNTER — Other Ambulatory Visit: Payer: Self-pay | Admitting: Medical

## 2017-10-24 MED ORDER — EMPAGLIFLOZIN 10 MG PO TABS: 10.0000 mg | ORAL_TABLET | Freq: Every day | ORAL | 2 refills | Status: DC

## 2017-10-24 MED ORDER — METFORMIN HCL 1000 MG PO TABS: 1000.0000 mg | ORAL_TABLET | Freq: Two times a day (BID) | ORAL | 0 refills | Status: DC

## 2017-11-12 ENCOUNTER — Other Ambulatory Visit: Payer: Self-pay | Admitting: Medical

## 2017-11-12 DIAGNOSIS — IMO0002 Reserved for concepts with insufficient information to code with codable children: Secondary | ICD-10-CM

## 2017-11-12 DIAGNOSIS — E1165 Type 2 diabetes mellitus with hyperglycemia: Secondary | ICD-10-CM

## 2017-11-12 DIAGNOSIS — E118 Type 2 diabetes mellitus with unspecified complications: Principal | ICD-10-CM

## 2018-02-21 ENCOUNTER — Encounter: Payer: Self-pay | Admitting: Physician Assistant

## 2018-03-10 ENCOUNTER — Encounter: Payer: Self-pay | Admitting: Physician Assistant

## 2018-03-10 NOTE — Progress Notes (Signed)
Cardiology Office Note    Date:  03/11/2018  ID:  Joshua Howell, DOB 10-May-1961, MRN [id number] PCP:  Jac Canavan, PA-C  Cardiologist:  Tobias Alexander, MD   Chief Complaint: f/u CAD  History of Present Illness:  Joshua Howell is a 57 y.o. male with history of CAD with NSTEMI 01/2017 s/p CABGx4 (LIMA to the LAD, SVG to the ramus intermediate, sequential SVG to PDA and PLVB), normal EF 55-60% at cath, dyslipidemia, type 2 diabetes mellitus, mild carotid disease (1-39% BICA 01/2017), family history of CAD who presents for follow-up CAD. His blood pressure around time of surgery was too low to add ARB. Last labs 10/2017 K 4.8, Cr 0.97, LFTs wnl, 03/2017 LDL 24, LFTS wnl, 01/2017 Hgb 13 and plt 117. Last OV 06/2017 with Leda Gauze, doing well at that time.  He comes back in today for follow-up feeling well. He denies any new anginal symptoms. No CP, SOB, LEE, orthopnea. He's lost 12lb since last visit by eating smaller portions and cutting carbs. He continues to work for [employer]. He does report ever since starting metoprolol post-bypass he's had issues with ED and wishes to discontinue that medication.   Past Medical History:  Diagnosis Date  . CAD (coronary artery disease)    a. NSTEMI 01/2017 s/p CABG.  . Carotid arterial disease (HCC)    a. mild 1-39% 01/2017.  Marland Kitchen Dyslipidemia   . Elevated blood pressure reading in office without diagnosis of hypertension   . Hepatitis B   . Impaired fasting blood sugar   . Migraine    "only in [employer]" (01/23/2017)  . Morbid obesity (HCC)   . NSTEMI (non-ST elevated myocardial infarction) (HCC) 01/23/2017  . Type I diabetes mellitus (HCC) 07/2016    Past Surgical History:  Procedure Laterality Date  . ANTERIOR CERVICAL DECOMP/DISCECTOMY FUSION     "took a piece off my right hip hip & put it in there"  . BACK SURGERY    . CORONARY ARTERY BYPASS GRAFT N/A 01/28/2017   Procedure: CORONARY ARTERY BYPASS GRAFTING (CABG ) x 4 USING LEFT  INTERNAL MAMMARY ARTERY AND ENDOSCOPIC HARVESTING OF RIGHT SAPHENOUS VEIN,  LIMA-LAD SVG-RAMUS SEQ SVG-PD-PL;  Surgeon: [name], MD;  Location: [facility] OR;  Service: Open Heart Surgery;  Laterality: N/A;  . LEFT HEART CATH AND CORONARY ANGIOGRAPHY N/A 01/24/2017   Procedure: LEFT HEART CATH AND CORONARY ANGIOGRAPHY;  Surgeon: Corky Crafts, MD;  Location: [facility] INVASIVE CV LAB;  Service: Cardiovascular;  Laterality: N/A;  . TEE WITHOUT CARDIOVERSION N/A 01/28/2017   Procedure: TRANSESOPHAGEAL ECHOCARDIOGRAM (TEE);  Surgeon: [name], MD;  Location: [facility] OR;  Service: Open Heart Surgery;  Laterality: N/A;    Current Medications: Current Meds  Medication Sig  . aspirin EC 81 MG tablet Take 1 tablet (81 mg total) by mouth daily.  Marland Kitchen atorvastatin (LIPITOR) 80 MG tablet Take 1 tablet (80 mg total) by mouth daily at 6 PM.  . DiphenhydrAMINE HCl (BENADRYL ALLERGY PO) Take 25 mg by mouth as needed (allergies).   . empagliflozin (JARDIANCE) 10 MG TABS tablet Take 10 mg by mouth daily.  . metFORMIN (GLUCOPHAGE) 1000 MG tablet Take 1 tablet (1,000 mg total) by mouth 2 (two) times daily with a meal.  . metoprolol tartrate (LOPRESSOR) 25 MG tablet Take 0.5 tablets (12.5 mg total) by mouth 2 (two) times daily.  . Naproxen Sodium (ALEVE PO) Take by mouth as directed.      Allergies:  Patient has no known allergies.   Social History   Socioeconomic History  . Marital status: Widowed    Spouse name: Not on file  . Number of children: Not on file  . Years of education: Not on file  . Highest education level: Not on file  Occupational History  . Not on file  Social Needs  . Financial resource strain: Not on file  . Food insecurity:    Worry: Not on file    Inability: Not on file  . Transportation needs:    Medical: Not on file    Non-medical: Not on file  Tobacco Use  . Smoking status: Former Smoker    Types: Cigarettes  . Smokeless tobacco: Never Used  . Tobacco  comment: 01/23/2017 "quit smoking in the 1990s"  Substance and Sexual Activity  . Alcohol use: Yes    Alcohol/week: 0.0 standard drinks    Comment: 01/23/2017 "maybe 4 beers/month"  . Drug use: No  . Sexual activity: Not Currently  Lifestyle  . Physical activity:    Days per week: Not on file    Minutes per session: Not on file  . Stress: Not on file  Relationships  . Social connections:    Talks on phone: Not on file    Gets together: Not on file    Attends religious service: Not on file    Active member of club or organization: Not on file    Attends meetings of clubs or organizations: Not on file    Relationship status: Not on file  Other Topics Concern  . Not on file  Social History Narrative  . Not on file     Family History:  The patient's family history includes Aneurysm in his mother; Gallbladder disease in his mother; Heart disease (age of onset: 42) in his brother; Heart disease (age of onset: 8) in his father; Other in his brother; Stroke in his mother.  ROS:   Please see the history of present illness.  All other systems are reviewed and otherwise negative.    PHYSICAL EXAM:   VS:  BP 128/82   Pulse 70   Ht 6' (1.829 m)   Wt 282 lb 12.8 oz (128.3 kg)   SpO2 96%   BMI 38.35 kg/m   BMI: Body mass index is 38.35 kg/m. GEN: Well nourished, well developed obese WM, in no acute distress HEENT: normocephalic, atraumatic Neck: no JVD, carotid bruits, or masses Cardiac: RRR; no murmurs, rubs, or gallops, no edema  Respiratory:  clear to auscultation bilaterally, normal work of breathing GI: soft, nontender, nondistended, + BS MS: no deformity or atrophy Skin: warm and dry, no rash Neuro:  Alert and Oriented x 3, Strength and sensation are intact, follows commands Psych: euthymic mood, full affect  Wt Readings from Last 3 Encounters:  03/11/18 282 lb 12.8 oz (128.3 kg)  10/23/17 294 lb (133.4 kg)  07/08/17 286 lb (129.7 kg)      Studies/Labs Reviewed:    EKG:  EKG was ordered today and personally reviewed by me and demonstrates NSR 70bpm nonspecific ST-T changes similar to prior  Recent Labs: 10/23/2017: ALT 16; BUN 12; Creatinine, Ser 0.97; Potassium 4.8; Sodium 139   Lipid Panel    Component Value Date/Time   CHOL 84 (L) 03/18/2017 1020   TRIG 117 03/18/2017 1020   HDL 37 (L) 03/18/2017 1020   CHOLHDL 2.3 03/18/2017 1020   CHOLHDL 3.8 01/24/2017 0640   VLDL 26 01/24/2017 0640   LDLCALC  24 03/18/2017 1020    Additional studies/ records that were reviewed today include: Summarized above.   ASSESSMENT & PLAN:   1. CAD - doing well from cardiac standpoint. Per current DOT guidelines, no current need for repeat testing given asymptomatic. This will need to be readdressed at the 5 year mark post surgery or sooner if any symptoms arise. Continue ASA, statin. I do not see that he was on Plavix post surgery after MI. Regardless he is greater than 1 year out from MI so no present indication to add. He does not want to take beta blocker any longer due to ED. Will discontinue today. I did ask him to follow BP at home with this change. [facility]. He will call if running >130 systolic at which time I would suggest adding ARB given concomitant DM - update BMET to have on file if needed. He does report intermittent BP in the 110 range in the past so for now will just monitor. Does need updated CBC with labs given prior low platelets post surgery - has not been rechecked. 2. Carotid artery disease - remains asymptomatic. Disease was mild by last duplex so will defer further imaging plans to primary cardiologist. Current guidelines indicate suggestion to defer further screening in asymptomatic patients. Continue to monitor for any development of bruit or symptoms. 3. Dyslipidemia - he has prior history of elevated triglycerides and low HDL but LDL has not been high in the past. Will check lipid profile today. LFTs in 10/2017  were normal. 4. Diabetes in obese - managed by primary care.  Disposition: F/u with Dr. Delton See in 6 months.   Medication Adjustments/Labs and Tests Ordered: Current medicines are reviewed at length with the patient today.  Concerns regarding medicines are outlined above. Medication changes, Labs and Tests ordered today are summarized above and listed in the Patient Instructions accessible in Encounters.   Signed, Laurann Montana, PA-C  03/11/2018 4:17 PM    [facility] [address] [place], [place], [place]  [address] Phone: [phone]; Fax: [phone]

## 2018-03-11 ENCOUNTER — Encounter: Payer: Self-pay | Admitting: Physician Assistant

## 2018-03-11 ENCOUNTER — Ambulatory Visit: Payer: BLUE CROSS/BLUE SHIELD | Admitting: Physician Assistant

## 2018-03-11 ENCOUNTER — Telehealth: Payer: Self-pay | Admitting: Medical

## 2018-03-11 VITALS — BP 128/82 | HR 70 | Ht 72.0 in | Wt 282.8 lb

## 2018-03-11 DIAGNOSIS — I2510 Atherosclerotic heart disease of native coronary artery without angina pectoris: Secondary | ICD-10-CM | POA: Diagnosis not present

## 2018-03-11 DIAGNOSIS — E785 Hyperlipidemia, unspecified: Secondary | ICD-10-CM | POA: Diagnosis not present

## 2018-03-11 DIAGNOSIS — I6523 Occlusion and stenosis of bilateral carotid arteries: Secondary | ICD-10-CM | POA: Diagnosis not present

## 2018-03-11 DIAGNOSIS — E1169 Type 2 diabetes mellitus with other specified complication: Secondary | ICD-10-CM

## 2018-03-11 DIAGNOSIS — E669 Obesity, unspecified: Secondary | ICD-10-CM

## 2018-03-11 DIAGNOSIS — E119 Type 2 diabetes mellitus without complications: Secondary | ICD-10-CM

## 2018-03-11 NOTE — Telephone Encounter (Signed)
Needs diabetes med check appt 

## 2018-03-11 NOTE — Patient Instructions (Signed)
Medication Instructions:  Your physician has recommended you make the following change in your medication:  1.  STOP the Metoprolol  If you need a refill on your cardiac medications before your next appointment, please call your pharmacy.   Lab work: TODAY:  BMET, CBC, & LIPIDS  If you have labs (blood work) drawn today and your tests are completely normal, you will receive your results only by: Marland Kitchen MyChart Message (if you have MyChart) OR . A paper copy in the mail If you have any lab test that is abnormal or we need to change your treatment, we will call you to review the results.  Testing/Procedures: None ordered  Follow-Up: At [facility], you and your health needs are our priority.  As part of our continuing mission to provide you with exceptional heart care, we have created designated Provider Care Teams.  These Care Teams include your primary Cardiologist (physician) and Advanced Practice Providers (APPs -  Physician Assistants and Nurse Practitioners) who all work together to provide you with the care you need, when you need it. You will need a follow up appointment in 6 months.  Please call our office 2 months in advance to schedule this appointment.  You may see Tobias Alexander, MD or one of the following Advanced Practice Providers on your designated Care Team:   Abney Crossroads, PA-C Ronie Spies, PA-C . Jacolyn Reedy, PA-C  Any Other Special Instructions Will Be Listed Below (If Applicable). CHECK YOUR BLOOD PRESSURE REGULARLY AND CALL OUR OFFICE IF I' TS RUNNING HIGHER THAN 130 ON THE TOP NUMBER

## 2018-03-12 LAB — CBC
HEMATOCRIT: 44.4 % (ref 37.5–51.0)
HEMOGLOBIN: 16 g/dL (ref 13.0–17.7)
MCH: 32.9 pg (ref 26.6–33.0)
MCHC: 36 g/dL — ABNORMAL HIGH (ref 31.5–35.7)
MCV: 91 fL (ref 79–97)
Platelets: 204 10*3/uL (ref 150–450)
RBC: 4.86 x10E6/uL (ref 4.14–5.80)
RDW: 12.5 % (ref 12.3–15.4)
WBC: 7.9 10*3/uL (ref 3.4–10.8)

## 2018-03-12 LAB — LIPID PANEL
Chol/HDL Ratio: 2.8 ratio (ref 0.0–5.0)
Cholesterol, Total: 110 mg/dL (ref 100–199)
HDL: 40 mg/dL (ref >39)
LDL CALC: 36 mg/dL (ref 0–99)
Triglycerides: 170 mg/dL — ABNORMAL HIGH (ref 0–149)
VLDL CHOLESTEROL CAL: 34 mg/dL (ref 5–40)

## 2018-03-12 LAB — BASIC METABOLIC PANEL
BUN/Creatinine Ratio: 14 (ref 9–20)
BUN: 15 mg/dL (ref 6–24)
CALCIUM: 9.3 mg/dL (ref 8.7–10.2)
CHLORIDE: 101 mmol/L (ref 96–106)
CO2: 21 mmol/L (ref 20–29)
CREATININE: 1.05 mg/dL (ref 0.76–1.27)
GFR, EST AFRICAN AMERICAN: 89 mL/min/{1.73_m2} (ref >59)
GFR, EST NON AFRICAN AMERICAN: 77 mL/min/{1.73_m2} (ref >59)
Glucose: 100 mg/dL — ABNORMAL HIGH (ref 65–99)
Potassium: 5 mmol/L (ref 3.5–5.2)
Sodium: 140 mmol/L (ref 134–144)

## 2018-03-12 NOTE — Telephone Encounter (Signed)
Pt states he will give [place] a call pt to set something up

## 2018-04-03 ENCOUNTER — Other Ambulatory Visit: Payer: Self-pay | Admitting: Medical

## 2018-04-14 ENCOUNTER — Ambulatory Visit: Payer: BLUE CROSS/BLUE SHIELD | Admitting: Medical

## 2018-04-14 ENCOUNTER — Encounter: Payer: Self-pay | Admitting: Medical

## 2018-04-14 VITALS — BP 120/78 | HR 72 | Temp 98.1°F | Resp 16 | Ht 72.0 in | Wt 281.4 lb

## 2018-04-14 DIAGNOSIS — E118 Type 2 diabetes mellitus with unspecified complications: Secondary | ICD-10-CM | POA: Diagnosis not present

## 2018-04-14 DIAGNOSIS — N529 Male erectile dysfunction, unspecified: Secondary | ICD-10-CM

## 2018-04-14 DIAGNOSIS — Z1211 Encounter for screening for malignant neoplasm of colon: Secondary | ICD-10-CM | POA: Diagnosis not present

## 2018-04-14 DIAGNOSIS — Z951 Presence of aortocoronary bypass graft: Secondary | ICD-10-CM

## 2018-04-14 DIAGNOSIS — E782 Mixed hyperlipidemia: Secondary | ICD-10-CM

## 2018-04-14 DIAGNOSIS — E1165 Type 2 diabetes mellitus with hyperglycemia: Secondary | ICD-10-CM | POA: Diagnosis not present

## 2018-04-14 DIAGNOSIS — Z136 Encounter for screening for cardiovascular disorders: Secondary | ICD-10-CM | POA: Insufficient documentation

## 2018-04-14 DIAGNOSIS — Z2820 Immunization not carried out because of patient decision for unspecified reason: Secondary | ICD-10-CM | POA: Diagnosis not present

## 2018-04-14 DIAGNOSIS — Z125 Encounter for screening for malignant neoplasm of prostate: Secondary | ICD-10-CM

## 2018-04-14 DIAGNOSIS — I214 Non-ST elevation (NSTEMI) myocardial infarction: Secondary | ICD-10-CM

## 2018-04-14 DIAGNOSIS — IMO0002 Reserved for concepts with insufficient information to code with codable children: Secondary | ICD-10-CM

## 2018-04-14 LAB — COLOGUARD

## 2018-04-14 NOTE — Patient Instructions (Signed)
Lets change strategies and try something that may work better than what you are currently doing  I want you to eat 3 meals a day +2 snacks, one midmorning snack and one mid afternoon snack  Breakfast You may eat 1 of the following  Smoothie with Almond milk, handful of kale or spinach, and 1-2 fruit servings of your choice such as berries or 1/2 banana  Whole grain slice of toast and thin layer of low sugar jam or small amount of honey  Whole grain slice of toast and avocado spread  1/2 cup of steel cut oats (oatmeal)   Mid-morning snack 1 fruit serving such as one of the following:  medium-sized apple  medium-sized orange,  Tangerine  1/2 banana   3/4 cup of fresh berries or frozen berries  A protein source such as one of the following:  8 almonds   small handful of walnuts or other nuts  Hummus and vegetable such as carrots   Lunch A protein source such as 1 of the following: . 1 serving of beans such as black beans, pinto beans, green beans, or edamame (soy beans) . Veggie burger  . Non breaded fish such as salmon or tuna, either baked, grilled, or broiled Vegetable - Half of your plate should be a non-starchy vegetables!  So avoid white potatoes and corn.  Otherwise, eat a large portion of vegetables. . Avocado, cucumber, tomato, carrots, greens, lettuce, squash, okra, etc.  . Vegetables can include salad with olive oil/vinaigrette dressing Grains such as 1/2 cup of brown rice, quinoa, barley or other whole grain or 1 or 2 slices of whole grain bread   Mid-afternoon snack 1 fruit serving such as one of the following:  medium-sized apple  medium-sized orange,  Tangerine  1/2 banana   3/4 cup of fresh berries or frozen berries  A protein source such as one of the following:  8 almonds   small handful of walnuts or other nuts  Hummus and vegetable such as carrots   Dinner A protein source such as 1 of the following: . 1 serving of beans such as  black beans, pinto beans, green beans, or edamame (soy beans) . Veggie burger  . Non breaded fish such as salmon or tuna, either baked, grilled, or broiled Vegetable - Half of your plate should be a non-starchy vegetables!  So avoid white potatoes and corn.  Otherwise, eat a large portion of vegetables. . Avocado, cucumber, tomato, carrots, greens, lettuce, squash, okra, etc.  . Vegetables can include salad with olive oil/vinaigrette dressing Grains such as 1/2 cup of brown rice, quinoa, barley or other whole grain or 1 or 2 slices of whole grain bread   Beverages: Water Unsweet tea Home made juice with a juicer without sugar added other than small bit of honey or agave nectar Water with sugar free flavor such as [place]   AVOID.... For the time being I want you to cut out the following items completely: . Soda, sweet tea, juice, beer or wine or alcohol . Sweets such as cake, candy, pies, chips, cookies, chocolate  

## 2018-04-14 NOTE — Progress Notes (Signed)
Subjective: Chief Complaint  Patient presents with  . med check    med check   cold    Here for med check  Diabetes - ran out of test strips.  Compliant with medication including metformin 1000 mg twice daily, Jardiance 10 mg daily.  No foot lesions  Hyperlipidemia-compliant with Lipitor 80 mg daily, aspirin 81 mg daily  History of heart attack-saw his heart doctor recently.  He says that now that he is 1 year out from his heart attack he was okay to use Viagra  ED-continues to complain of difficulty getting and keeping erections.  No prior use of Viagra but would like to try this  Past Medical History:  Diagnosis Date  . CAD (coronary artery disease)    a. NSTEMI 01/2017 s/p CABG.  . Carotid arterial disease (HCC)    a. mild 1-39% 01/2017.  Marland Kitchen. Dyslipidemia   . Elevated blood pressure reading in office without diagnosis of hypertension   . Hepatitis B   . Impaired fasting blood sugar   . Migraine    "only in [employer]" (01/23/2017)  . Morbid obesity (HCC)   . NSTEMI (non-ST elevated myocardial infarction) (HCC) 01/23/2017  . Type I diabetes mellitus (HCC) 07/2016   Current Outpatient Medications on File Prior to Visit  Medication Sig Dispense Refill  . aspirin EC 81 MG tablet Take 1 tablet (81 mg total) by mouth daily. 90 tablet 3  . atorvastatin (LIPITOR) 80 MG tablet Take 1 tablet (80 mg total) by mouth daily at 6 PM. 90 tablet 3  . DiphenhydrAMINE HCl (BENADRYL ALLERGY PO) Take 25 mg by mouth as needed (allergies).     . metFORMIN (GLUCOPHAGE) 1000 MG tablet TAKE 1 TABLET (1,000 MG TOTAL) BY MOUTH 2 (TWO) TIMES DAILY WITH A MEAL. 60 tablet 0  . Naproxen Sodium (ALEVE PO) Take by mouth as directed.    . empagliflozin (JARDIANCE) 10 MG TABS tablet Take 10 mg by mouth daily. (Patient not taking: Reported on 04/14/2018) 30 tablet 2   No current facility-administered medications on file prior to visit.    ROS as in subjective    Objective: BP 120/78   Pulse 72    Temp 98.1 F (36.7 C) (Oral)   Resp 16   Ht 6' (1.829 m)   Wt 281 lb 6.4 oz (127.6 kg)   SpO2 95%   BMI 38.16 kg/m    Wt Readings from Last 3 Encounters:  04/14/18 281 lb 6.4 oz (127.6 kg)  03/11/18 282 lb 12.8 oz (128.3 kg)  10/23/17 294 lb (133.4 kg)    Gen: wd, wn, nad Heart RRR normal s1, s2, no murmur Lungs clear Ext: no edema Pulses 2+     Assessment: Encounter Diagnoses  Name Primary?  Marland Kitchen. Uncontrolled type 2 diabetes mellitus with complication, without long-term current use of insulin (HCC) Yes  . Screening for prostate cancer   . Screen for colon cancer   . Vaccine refused by patient   . NSTEMI (non-ST elevated myocardial infarction) (HCC)   . Erectile dysfunction, unspecified erectile dysfunction type   . S/P CABG x 4   . Mixed dyslipidemia       Plan: Diabetes-compliant with medication, labs today, reviewed most recent labs in chart from 2 months ago, advised daily foot checks, yearly eye doctor visit.  Dyslipidemia- continue statin and aspirin  Hx/o MI, s/p CABG - I reviewed his recent cardiology notes.  I will send a message to them to verify  that they are okay with him using Viagra or similar medication  We discussed vaccine recommendations including yearly flu shot, pneumococcal vaccine, Shingrix vaccine, Td and he declines all these  Izaac was seen today for med check.  Diagnoses and all orders for this visit:  Uncontrolled type 2 diabetes mellitus with complication, without long-term current use of insulin (HCC) -     HM DIABETES EYE EXAM -     HM DIABETES FOOT EXAM -     Hemoglobin A1c  Screening for prostate cancer -     PSA  Screen for colon cancer -     Cologuard  Vaccine refused by patient  NSTEMI (non-ST elevated myocardial infarction) (HCC)  Erectile dysfunction, unspecified erectile dysfunction type  S/P CABG x 4  Mixed dyslipidemia

## 2018-04-15 ENCOUNTER — Other Ambulatory Visit: Payer: Self-pay | Admitting: Medical

## 2018-04-15 ENCOUNTER — Encounter: Payer: Self-pay | Admitting: Physician Assistant

## 2018-04-15 ENCOUNTER — Encounter: Payer: Self-pay | Admitting: Medical

## 2018-04-15 LAB — PSA: Prostate Specific Ag, Serum: 1.5 ng/mL (ref 0.0–4.0)

## 2018-04-15 LAB — HEMOGLOBIN A1C
Est. average glucose Bld gHb Est-mCnc: 151 mg/dL
Hgb A1c MFr Bld: 6.9 % — ABNORMAL HIGH (ref 4.8–5.6)

## 2018-04-15 MED ORDER — SILDENAFIL CITRATE 100 MG PO TABS: ORAL_TABLET | ORAL | 1 refills | Status: DC

## 2018-04-15 MED ORDER — EMPAGLIFLOZIN 10 MG PO TABS: 10.0000 mg | ORAL_TABLET | Freq: Every day | ORAL | 0 refills | Status: DC

## 2018-04-15 MED ORDER — METFORMIN HCL 1000 MG PO TABS: 1000.0000 mg | ORAL_TABLET | Freq: Two times a day (BID) | ORAL | 1 refills | Status: DC

## 2018-04-15 MED ORDER — EMPAGLIFLOZIN 25 MG PO TABS: 25.0000 mg | ORAL_TABLET | Freq: Every day | ORAL | 1 refills | Status: DC

## 2018-04-15 NOTE — Progress Notes (Signed)
Received msg from primary care inquiring if OK to prescribe viagra from cardiac standpoint. He is not on any active nitrate therapy so I told primary care OK to prescribe but to please inform patient no NTG within 48 hours of Viagra dose and vice versa. Terriyah Westra PA-C

## 2018-04-21 MED ORDER — ATORVASTATIN CALCIUM 80 MG PO TABS: 80.0000 mg | ORAL_TABLET | Freq: Every day | ORAL | 3 refills | Status: DC

## 2018-04-21 MED ORDER — SILDENAFIL CITRATE 100 MG PO TABS: ORAL_TABLET | ORAL | 1 refills | Status: DC

## 2018-04-21 MED ORDER — EMPAGLIFLOZIN 25 MG PO TABS: 25.0000 mg | ORAL_TABLET | Freq: Every day | ORAL | 0 refills | Status: DC

## 2018-05-09 ENCOUNTER — Telehealth: Payer: Self-pay | Admitting: Family Medicine

## 2018-05-09 ENCOUNTER — Other Ambulatory Visit: Payer: Self-pay | Admitting: Physician Assistant

## 2018-05-09 NOTE — Telephone Encounter (Signed)
Pt wife called and states patient has to use [facility] and needs Metformin 1000 mg

## 2018-05-12 MED ORDER — METFORMIN HCL 1000 MG PO TABS: 1000.0000 mg | ORAL_TABLET | Freq: Two times a day (BID) | ORAL | 1 refills | Status: DC

## 2018-05-12 NOTE — Addendum Note (Signed)
Addended by: Herminio CommonsJOHNSON, Pape Parson A on: 05/12/2018 12:07 PM   Modules accepted: Orders

## 2018-05-12 NOTE — Telephone Encounter (Signed)
[facility]

## 2018-10-01 ENCOUNTER — Encounter (HOSPITAL_COMMUNITY): Payer: Self-pay

## 2018-10-01 ENCOUNTER — Ambulatory Visit (HOSPITAL_COMMUNITY)
Admission: EM | Admit: 2018-10-01 | Discharge: 2018-10-01 | Disposition: A | Discharge status: Home / Self Care | Payer: BC Managed Care – PPO | Attending: Family Medicine | Admitting: Family Medicine

## 2018-10-01 ENCOUNTER — Other Ambulatory Visit: Payer: Self-pay

## 2018-10-01 DIAGNOSIS — W57XXXA Bitten or stung by nonvenomous insect and other nonvenomous arthropods, initial encounter: Secondary | ICD-10-CM | POA: Diagnosis not present

## 2018-10-01 DIAGNOSIS — S20362A Insect bite (nonvenomous) of left front wall of thorax, initial encounter: Secondary | ICD-10-CM

## 2018-10-01 MED ORDER — DOXYCYCLINE HYCLATE 100 MG PO CAPS: 100.0000 mg | ORAL_CAPSULE | Freq: Two times a day (BID) | ORAL | 0 refills | Status: DC

## 2018-10-01 NOTE — Discharge Instructions (Signed)
We will go ahead and cover you for tick born illness Take the medication as prescribed.

## 2018-10-01 NOTE — ED Triage Notes (Signed)
Pt cc states he was bitten on Sunday by a tick on his left side and underneath his left arm. Pt has a red spot on his left side.

## 2018-10-01 NOTE — ED Provider Notes (Signed)
[facility]    CSN: [id number] Arrival date & time: 10/01/18  0801     History   Chief Complaint Chief Complaint  Patient presents with  . Insect Bite    HPI Joshua Howell is a 58 y.o. male.   Patient is a 58 year old male the presents today with multiple tick bites.  This occurred Monday.  The bites are under the left axilla area and near the left chest area.  He was able to remove the entire tick. He was concerned with one of the bites due to large red  area around it. He cleaned the area. Denies any pain. No fever, joint aches, fatigue or nausea. He feels well. The tick was small with white dot on back.   ROS per HPI      Past Medical History:  Diagnosis Date  . CAD (coronary artery disease)    a. NSTEMI 01/2017 s/p CABG.  . Carotid arterial disease (HCC)    a. mild 1-39% 01/2017.  Marland Kitchen Dyslipidemia   . Elevated blood pressure reading in office without diagnosis of hypertension   . Hepatitis B   . Impaired fasting blood sugar   . Migraine    "only in [employer]" (01/23/2017)  . Morbid obesity (HCC)   . NSTEMI (non-ST elevated myocardial infarction) (HCC) 01/23/2017  . Type I diabetes mellitus (HCC) 07/2016    Patient Active Problem List   Diagnosis Date Noted  . Vaccine refused by patient 04/14/2018  . Screening for prostate cancer 04/14/2018  . Erectile dysfunction 10/23/2017  . 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 Family history of premature CAD 01/23/2017  . DOE (dyspnea on exertion) 01/23/2017  . Chest pain 01/23/2017  . NSTEMI (non-ST elevated myocardial infarction) (HCC) 01/23/2017  . Mixed dyslipidemia 06/26/2016  . Snoring 06/06/2016  . Uncontrolled type 2 diabetes mellitus with complication, without long-term current use of insulin (HCC) 09/03/2014  . Morbid obesity (HCC) 09/03/2014    Past Surgical History:  Procedure Laterality Date  . ANTERIOR CERVICAL DECOMP/DISCECTOMY FUSION     "took a piece off my right hip hip & put it in  there"  . BACK SURGERY    . CORONARY ARTERY BYPASS GRAFT N/A 01/28/2017   Procedure: CORONARY ARTERY BYPASS GRAFTING (CABG ) x 4 USING LEFT INTERNAL MAMMARY ARTERY AND ENDOSCOPIC HARVESTING OF RIGHT SAPHENOUS VEIN,  LIMA-LAD SVG-RAMUS SEQ SVG-PD-PL;  Surgeon: [name], MD;  Location: [facility] OR;  Service: Open Heart Surgery;  Laterality: N/A;  . LEFT HEART CATH AND CORONARY ANGIOGRAPHY N/A 01/24/2017   Procedure: LEFT HEART CATH AND CORONARY ANGIOGRAPHY;  Surgeon: Corky Crafts, MD;  Location: [facility] INVASIVE CV LAB;  Service: Cardiovascular;  Laterality: N/A;  . TEE WITHOUT CARDIOVERSION N/A 01/28/2017   Procedure: TRANSESOPHAGEAL ECHOCARDIOGRAM (TEE);  Surgeon: [name], MD;  Location: [facility] OR;  Service: Open Heart Surgery;  Laterality: N/A;       Home Medications    Prior to Admission medications   Medication Sig Start Date End Date Taking? Authorizing Provider  aspirin EC 81 MG tablet Take 1 tablet (81 mg total) by mouth daily. 07/08/17   Dyann Kief, PA-C  atorvastatin (LIPITOR) 80 MG tablet TAKE 1 TABLET DAILY AT 6 PM 05/12/18   Dunn, Tacey Ruiz, PA-C  DiphenhydrAMINE HCl (BENADRYL ALLERGY PO) Take 25 mg by mouth as needed (allergies).     [provider]  doxycycline (VIBRAMYCIN) 100 MG capsule Take 1 capsule (100 mg total) by  mouth 2 (two) times daily. 10/01/18   Dahlia ByesBast, Valaria Kohut A, NP  empagliflozin (JARDIANCE) 25 MG TABS tablet Take 25 mg by mouth daily. 04/21/18   Tysinger, Kermit Baloavid S, PA-C  empagliflozin (JARDIANCE) 25 MG TABS tablet Take 25 mg by mouth daily. 04/15/18   Tysinger, Kermit Baloavid S, PA-C  metFORMIN (GLUCOPHAGE) 1000 MG tablet Take 1 tablet (1,000 mg total) by mouth 2 (two) times daily with a meal. 05/12/18   Tysinger, Kermit Baloavid S, PA-C  Naproxen Sodium (ALEVE PO) Take by mouth as directed.    [provider]  sildenafil (VIAGRA) 100 MG tablet 1/2-1 tablet po daily prn 04/21/18   Tysinger, Kermit Baloavid S, PA-C  sildenafil (VIAGRA) 100 MG tablet 1/2-1 tablet  po daily prn 04/15/18   Tysinger, Kermit Baloavid S, PA-C    Family History Family History  Problem Relation Age of Onset  . Gallbladder disease Mother   . Aneurysm Mother        brain  . Stroke Mother   . Heart disease Father 55       3 vessel ABG  . Heart disease Brother 45       MI  . Other Brother        died of hemorrhage in head after fall    Social History Social History   Tobacco Use  . Smoking status: Former Smoker    Types: Cigarettes  . Smokeless tobacco: Never Used  . Tobacco comment: 01/23/2017 "quit smoking in the 1990s"  Substance Use Topics  . Alcohol use: Yes    Alcohol/week: 0.0 standard drinks    Comment: 01/23/2017 "maybe 4 beers/month"  . Drug use: No     Allergies   Patient has no known allergies.   Review of Systems Review of Systems   Physical Exam Triage Vital Signs ED Triage Vitals  Enc Vitals Group     BP 10/01/18 0823 122/71     Pulse Rate 10/01/18 0823 76     Resp 10/01/18 0823 18     Temp 10/01/18 0823 97.8 F (36.6 C)     Temp Source 10/01/18 0823 Oral     SpO2 10/01/18 0823 97 %     Weight 10/01/18 0822 280 lb (127 kg)     Height --      Head Circumference --      Peak Flow --      Pain Score 10/01/18 0821 0     Pain Loc --      Pain Edu? --      Excl. in GC? --    No data found.  Updated Vital Signs BP 122/71 (BP Location: Right Arm)   Pulse 76   Temp 97.8 F (36.6 C) (Oral)   Resp 18   Wt 280 lb (127 kg)   SpO2 97%   BMI 37.97 kg/m   Visual Acuity Right Eye Distance:   Left Eye Distance:   Bilateral Distance:    Right Eye Near:   Left Eye Near:    Bilateral Near:     Physical Exam Vitals signs and nursing note reviewed.  Constitutional:      General: He is not in acute distress.    Appearance: Normal appearance. He is not ill-appearing, toxic-appearing or diaphoretic.  HENT:     Head: Normocephalic and atraumatic.     Nose: Nose normal.  Eyes:     Conjunctiva/sclera: Conjunctivae normal.  Pulmonary:      Effort: Pulmonary effort is normal.  Musculoskeletal: Normal range of motion.  Skin:    General: Skin is warm and dry.          Comments: Red circular area surrounding bite just under the left chest. Non  tender to touch. Scattered petechia.  Multiple other bites under the left axilla   Neurological:     Mental Status: He is alert.  Psychiatric:        Mood and Affect: Mood normal.      UC Treatments / Results  Labs (all labs ordered are listed, but only abnormal results are displayed) Labs Reviewed - No data to display  EKG None  Radiology No results found.  Procedures Procedures (including critical care time)  Medications Ordered in UC Medications - No data to display  Initial Impression / Assessment and Plan / UC Course  I have reviewed the triage vital signs and the nursing notes.  Pertinent labs & imaging results that were available during my care of the patient were reviewed by me and considered in my medical decision making (see chart for details).    Tick bite.   Will go ahead and treat for potential tick borne illness Pt brought tick in and most similar to the lone star tick with the white dot on back.  Medication sent to the pharmacy.  Follow up as needed for continued or worsening symptoms  Final Clinical Impressions(s) / UC Diagnoses   Final diagnoses:  Tick bite, initial encounter     Discharge Instructions     We will go ahead and cover you for tick born illness Take the medication as prescribed.     ED Prescriptions    Medication Sig Dispense Auth. Provider   doxycycline (VIBRAMYCIN) 100 MG capsule Take 1 capsule (100 mg total) by mouth 2 (two) times daily. 20 capsule Dahlia Byes A, NP     Controlled Substance Prescriptions [place] Controlled Substance Registry consulted? Not Applicable   Janace Aris, NP 10/01/18 [phone]

## 2018-11-04 ENCOUNTER — Telehealth: Payer: Self-pay

## 2018-11-04 MED ORDER — METFORMIN HCL 1000 MG PO TABS: 1000.0000 mg | ORAL_TABLET | Freq: Two times a day (BID) | ORAL | 1 refills | Status: DC

## 2018-11-04 MED ORDER — ATORVASTATIN CALCIUM 80 MG PO TABS: ORAL_TABLET | ORAL | 1 refills | Status: DC

## 2018-11-04 NOTE — Telephone Encounter (Signed)
Pt daughter called and stated 2 of pt medications need to go to [facility].

## 2019-03-20 ENCOUNTER — Other Ambulatory Visit: Payer: Self-pay | Admitting: Medical

## 2019-06-11 IMAGING — DX DG CHEST 1V PORT
1 series · 1 of 1 positions shown · non-contrast
Comparison: Radiograph January 28, 2017.

CLINICAL DATA: Chest tube.

EXAM:
PORTABLE CHEST 1 VIEW

[chest ap]
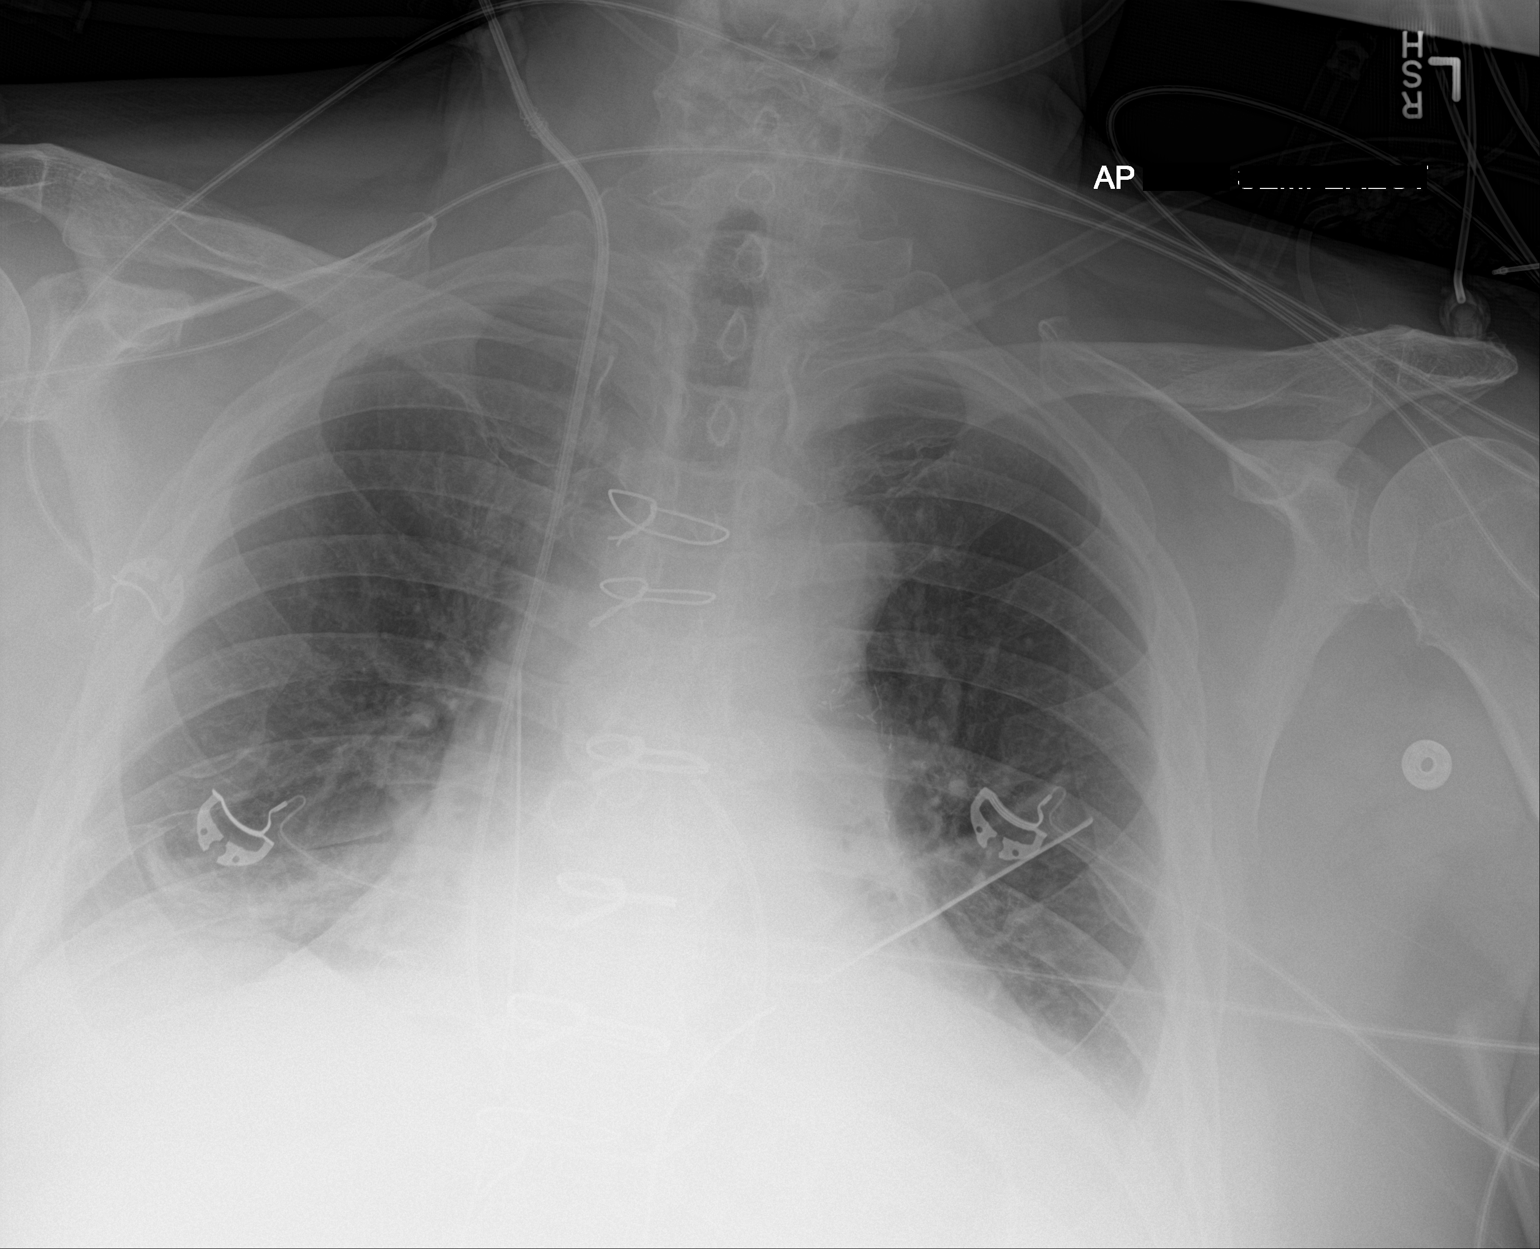

[1 of 1 positions shown; findings below may reference images not displayed]

FINDINGS: Stable cardiomediastinal silhouette. Status post coronary artery
bypass graft. Endotracheal and nasogastric tubes have been removed.
Right internal jugular catheter is noted with distal tip in expected
position of main pulmonary artery. No pneumothorax is noted.
Increased bibasilar atelectasis or edema is noted with probable
minimal associated pleural effusions. Left-sided chest tube is
unchanged in position. Bony thorax is unremarkable.
IMPRESSION: Endotracheal and nasogastric tubes have been removed. Left-sided
chest tube is unchanged in position with no pneumothorax. Increased
bibasilar atelectasis or edema is noted with associated minimal
pleural effusions.

## 2019-06-12 IMAGING — DX DG CHEST 1V PORT
1 series · 1 of 1 positions shown · non-contrast
Comparison: 01/29/2017

CLINICAL DATA: Post CABG, chest soreness

EXAM:
PORTABLE CHEST 1 VIEW

[chest]
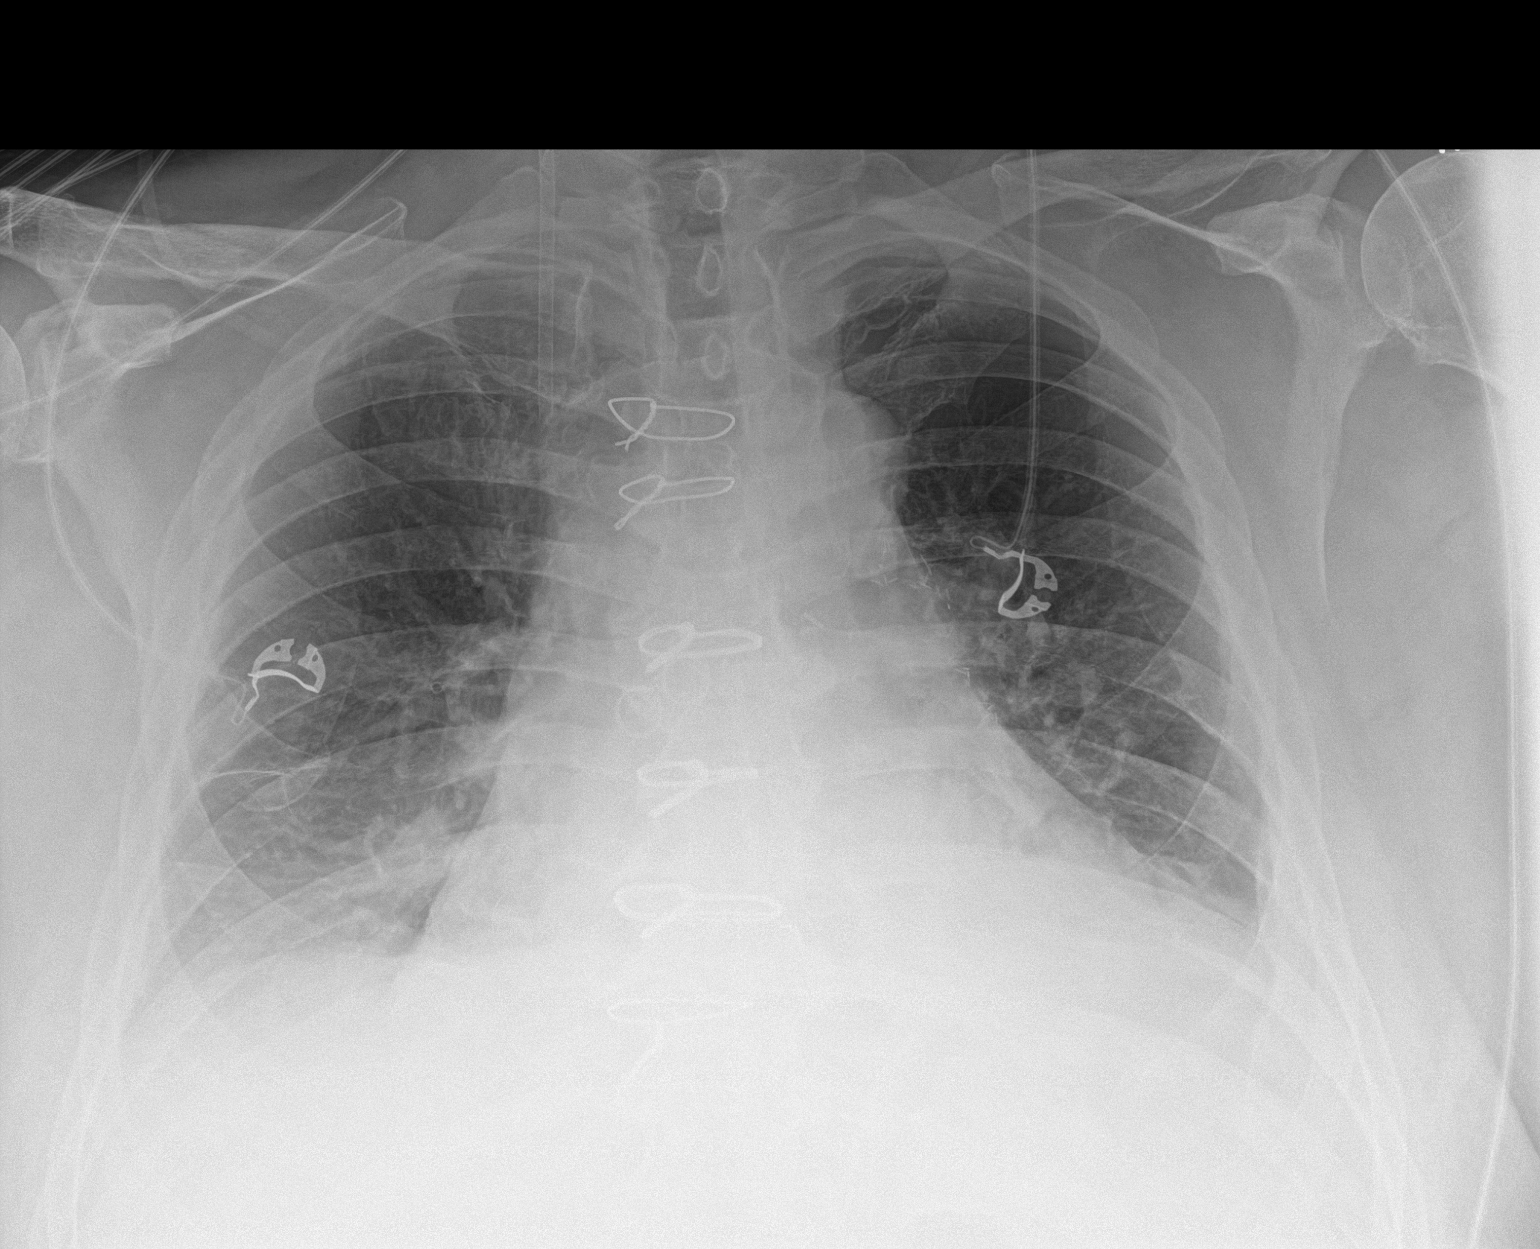

[1 of 1 positions shown; findings below may reference images not displayed]

FINDINGS: Interval removal of Swan-Ganz catheter, left chest tube and
mediastinal drains. No pneumothorax. Cardiomegaly with vascular
congestion, bibasilar atelectasis and small effusions.
IMPRESSION: Interval removal of left chest tube without pneumothorax.

Continued bibasilar atelectasis and small effusions. Mild vascular
congestion.

## 2019-06-13 IMAGING — CR DG CHEST 2V
2 series · 2 of 2 positions shown · non-contrast
Comparison: January 30, 2017

CLINICAL DATA: Shortness of Breath

EXAM:
CHEST  2 VIEW

[chest pa]
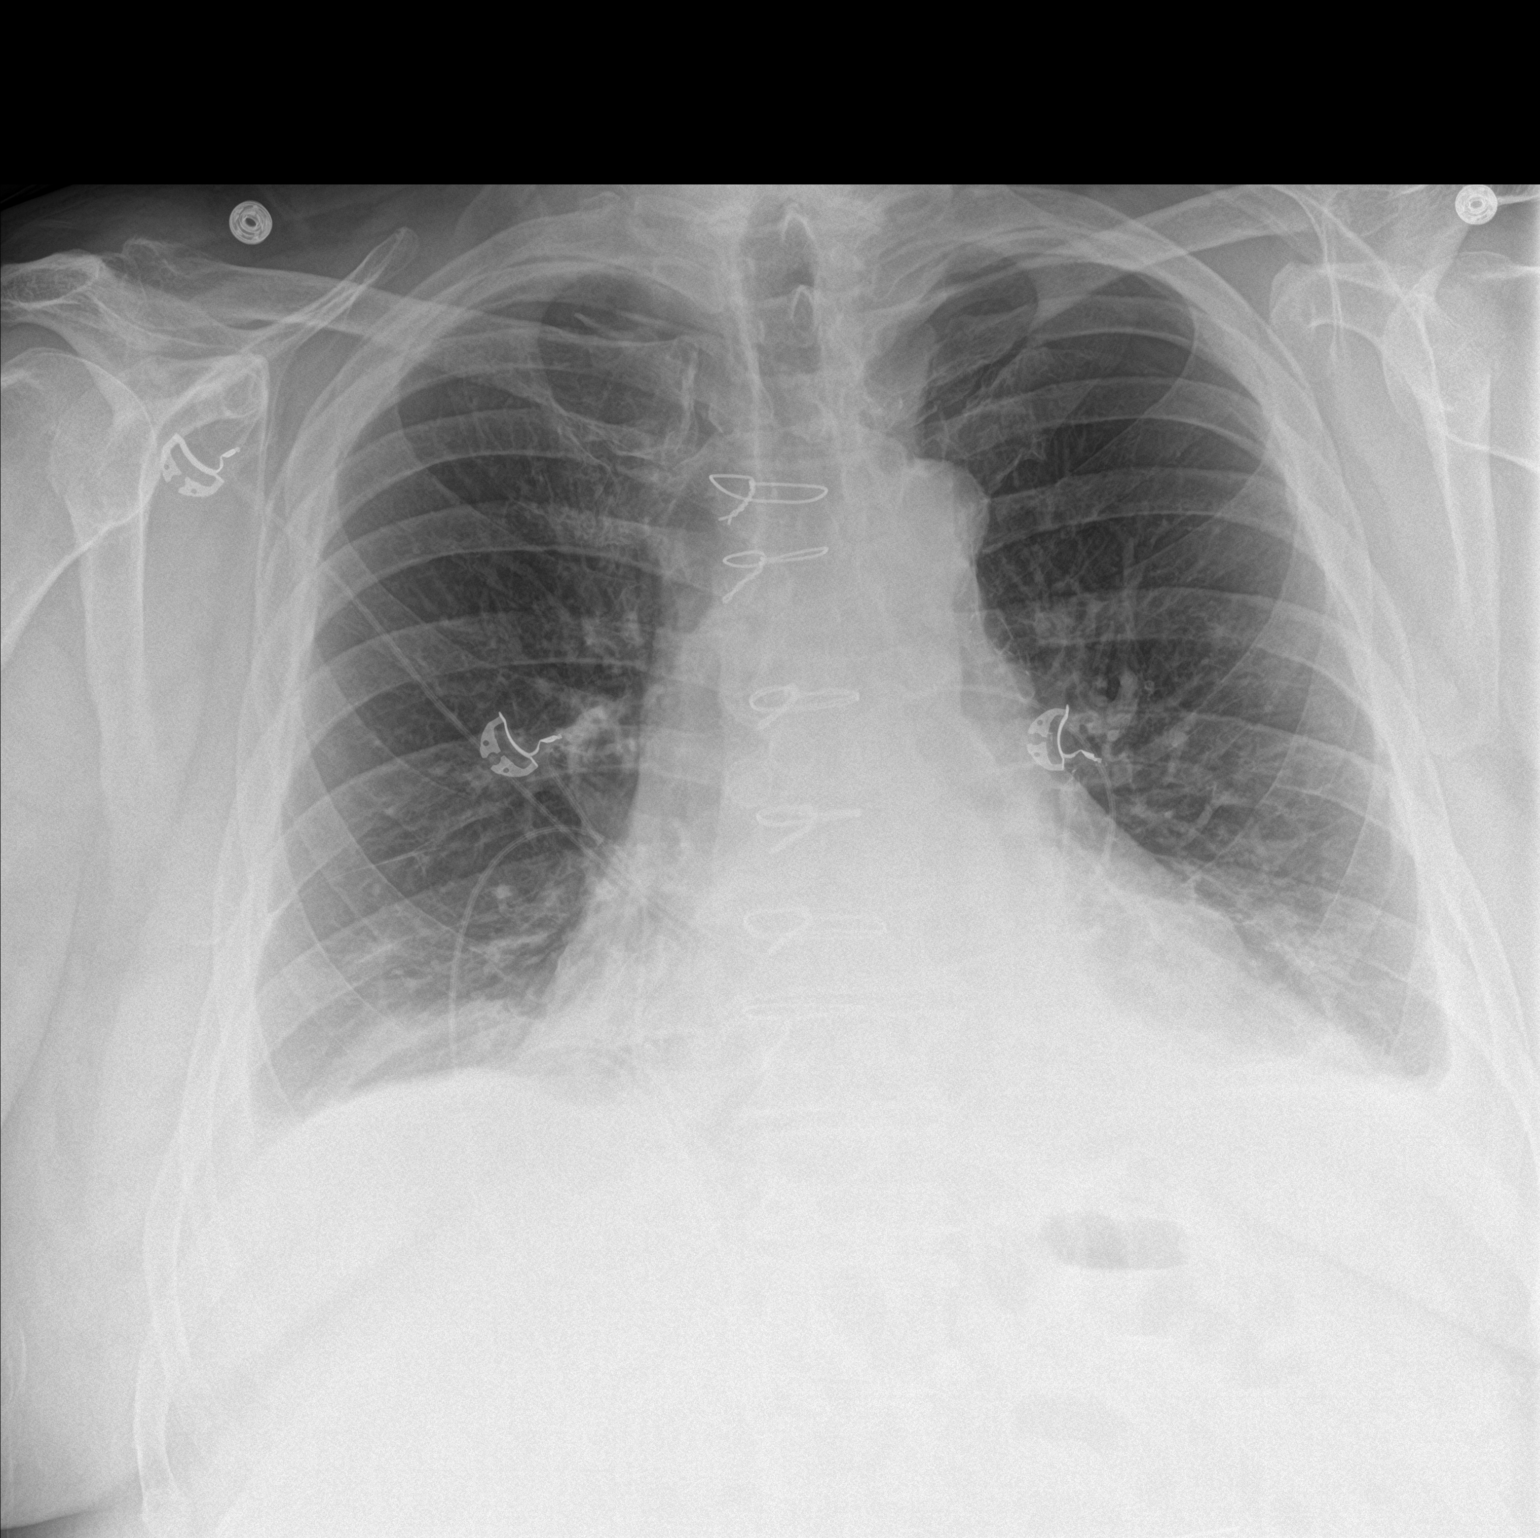

[chest lat]
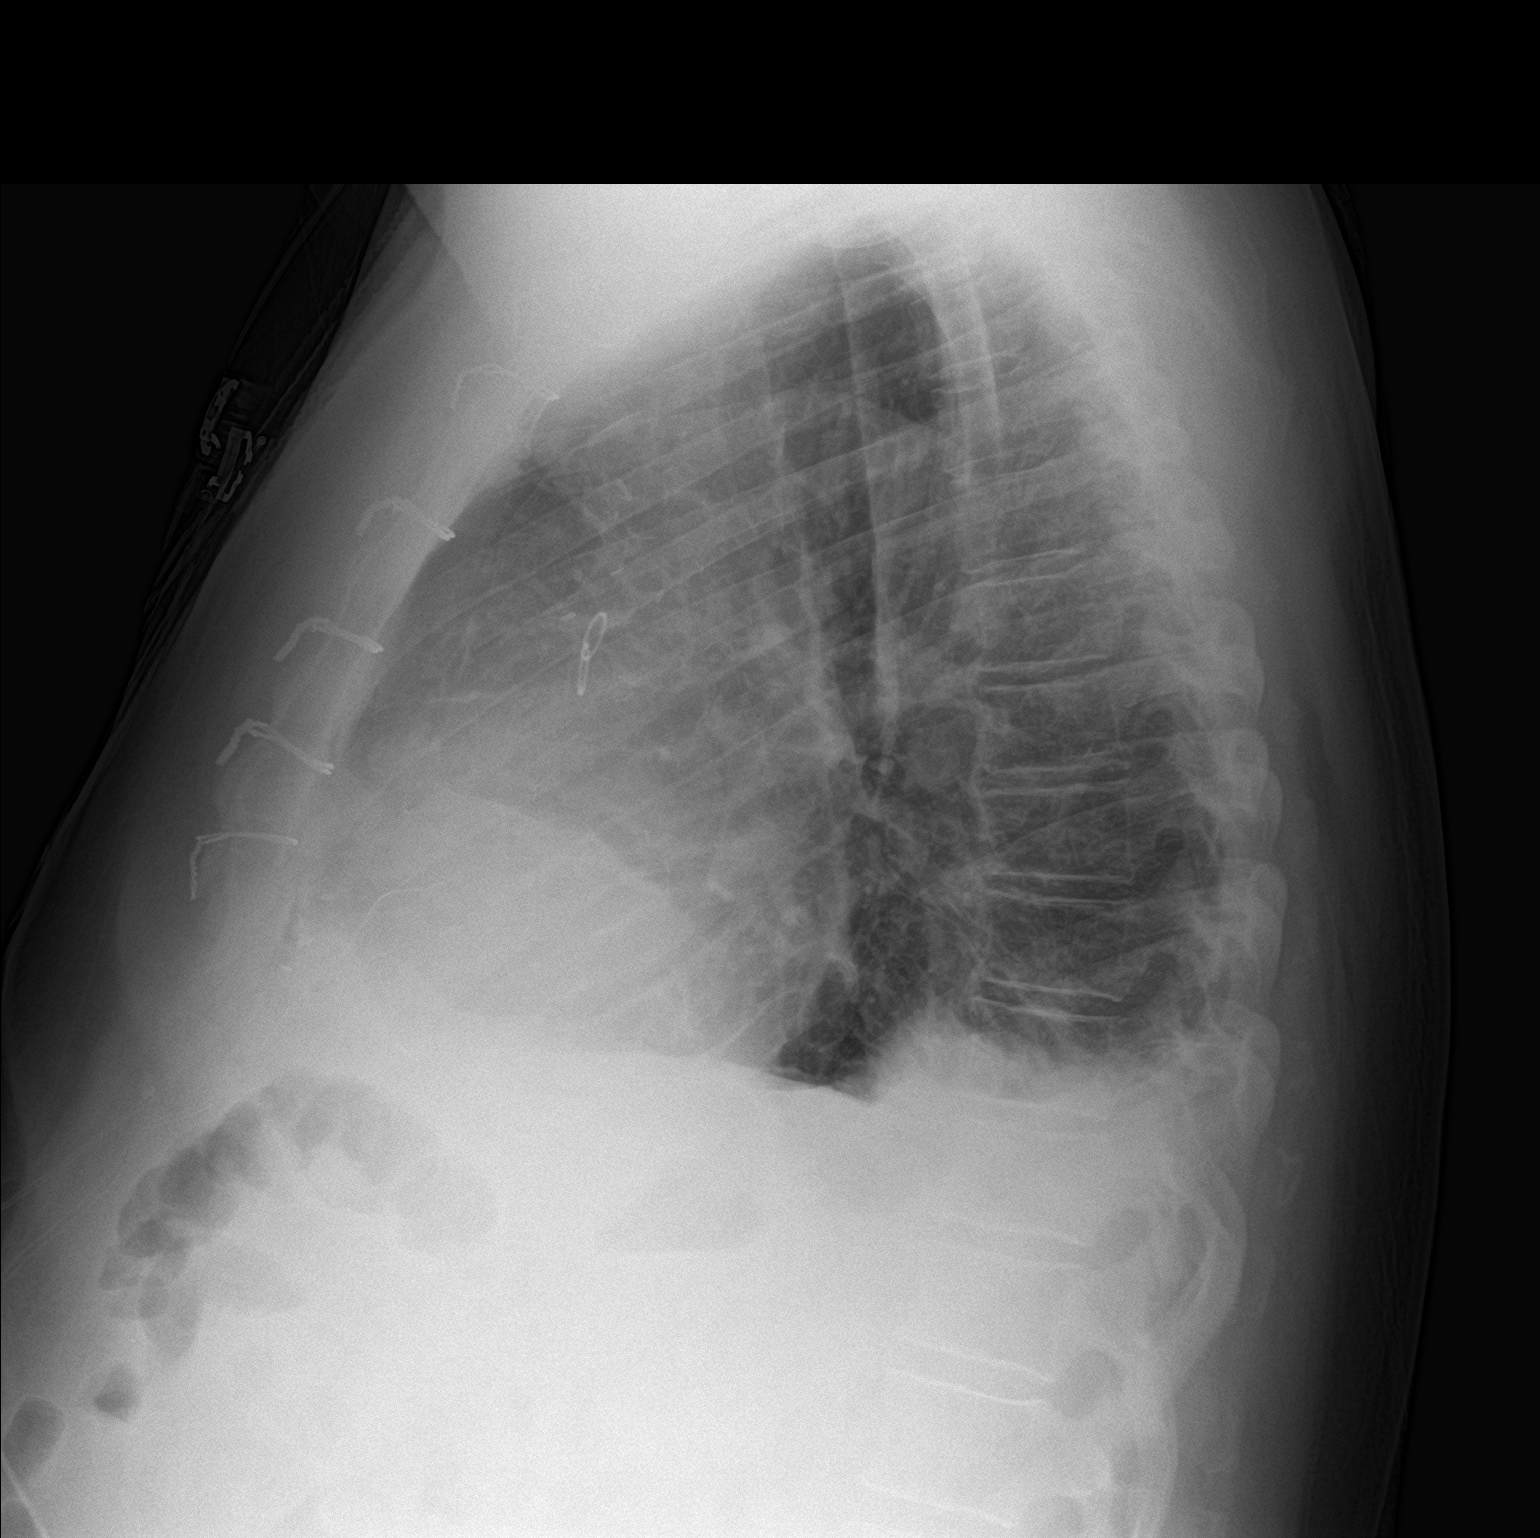

[2 of 2 positions shown; findings below may reference images not displayed]

FINDINGS: There is a pneumothorax in the left apex without tension component.
This pneumothorax appears stable compared to 1 day prior. No
pneumothorax is evident on the right. Cordis has been removed. There
is a small pleural effusion on each side. There is bibasilar
atelectasis. Lungs elsewhere are clear.

Heart is mildly enlarged with pulmonary vascularity within normal
limits. No adenopathy. No bone lesions. Patient is status post
coronary artery bypass grafting.
IMPRESSION: Stable left apical pneumothorax. No tension component. Small pleural
effusions bilaterally with bibasilar atelectasis. Stable cardiac
prominence.

Critical Value/emergent results were called by telephone at the time
of interpretation on 01/31/2017 at [DATE] to Mathias Alexander Paulet, RN, who
verbally acknowledged these results.

## 2019-09-23 ENCOUNTER — Ambulatory Visit (INDEPENDENT_AMBULATORY_CARE_PROVIDER_SITE_OTHER): Payer: BC Managed Care – PPO | Admitting: Medical

## 2019-09-23 ENCOUNTER — Encounter: Payer: Self-pay | Admitting: Medical

## 2019-09-23 ENCOUNTER — Other Ambulatory Visit: Payer: Self-pay

## 2019-09-23 ENCOUNTER — Telehealth: Payer: Self-pay | Admitting: Medical

## 2019-09-23 VITALS — BP 134/88 | HR 78 | Temp 97.8°F | Ht 72.0 in | Wt 289.8 lb

## 2019-09-23 DIAGNOSIS — IMO0002 Reserved for concepts with insufficient information to code with codable children: Secondary | ICD-10-CM

## 2019-09-23 DIAGNOSIS — E782 Mixed hyperlipidemia: Secondary | ICD-10-CM

## 2019-09-23 DIAGNOSIS — Z951 Presence of aortocoronary bypass graft: Secondary | ICD-10-CM

## 2019-09-23 DIAGNOSIS — Z2820 Immunization not carried out because of patient decision for unspecified reason: Secondary | ICD-10-CM

## 2019-09-23 DIAGNOSIS — Z136 Encounter for screening for cardiovascular disorders: Secondary | ICD-10-CM

## 2019-09-23 DIAGNOSIS — E1165 Type 2 diabetes mellitus with hyperglycemia: Secondary | ICD-10-CM | POA: Diagnosis not present

## 2019-09-23 DIAGNOSIS — I6529 Occlusion and stenosis of unspecified carotid artery: Secondary | ICD-10-CM

## 2019-09-23 DIAGNOSIS — R0989 Other specified symptoms and signs involving the circulatory and respiratory systems: Secondary | ICD-10-CM

## 2019-09-23 DIAGNOSIS — Z91199 Patient's noncompliance with other medical treatment and regimen due to unspecified reason: Secondary | ICD-10-CM

## 2019-09-23 DIAGNOSIS — I214 Non-ST elevation (NSTEMI) myocardial infarction: Secondary | ICD-10-CM | POA: Diagnosis not present

## 2019-09-23 DIAGNOSIS — Z0000 Encounter for general adult medical examination without abnormal findings: Secondary | ICD-10-CM

## 2019-09-23 DIAGNOSIS — E118 Type 2 diabetes mellitus with unspecified complications: Secondary | ICD-10-CM | POA: Diagnosis not present

## 2019-09-23 DIAGNOSIS — Z8249 Family history of ischemic heart disease and other diseases of the circulatory system: Secondary | ICD-10-CM

## 2019-09-23 DIAGNOSIS — Z1211 Encounter for screening for malignant neoplasm of colon: Secondary | ICD-10-CM

## 2019-09-23 DIAGNOSIS — N529 Male erectile dysfunction, unspecified: Secondary | ICD-10-CM

## 2019-09-23 DIAGNOSIS — Z9119 Patient's noncompliance with other medical treatment and regimen: Secondary | ICD-10-CM

## 2019-09-23 DIAGNOSIS — J301 Allergic rhinitis due to pollen: Secondary | ICD-10-CM

## 2019-09-23 DIAGNOSIS — Z125 Encounter for screening for malignant neoplasm of prostate: Secondary | ICD-10-CM

## 2019-09-23 DIAGNOSIS — M65342 Trigger finger, left ring finger: Secondary | ICD-10-CM

## 2019-09-23 NOTE — Telephone Encounter (Signed)
Referrals today: Ortho for trigger finger x 64mo  If he is agreeable, I want to do updated carotid ultrasound and ABIs screen

## 2019-09-23 NOTE — Patient Instructions (Signed)
Preventative Care for Adults - Male    Thank you for coming in for your well visit today, and thank you for trusting [place] with your care!   Maintain regular health and wellness exams:  A routine yearly physical is a good way to check in with your primary care provider about your health and preventive screening. It is also an opportunity to share updates about your health and any concerns you have, and receive a thorough all-over exam.   Most health insurance companies pay for at least some preventative services.  Check with your health plan for specific coverages.  What preventative services do men need?  Adult men should have their weight and blood pressure checked regularly.   Men age 61 and older should have their cholesterol levels checked regularly.  Beginning at age 61 and continuing to age 61, men should be screened for colorectal cancer.  Certain people may need continued testing until age 21.  Updating vaccinations is part of preventative care.  Vaccinations help protect against diseases such as the flu.  Osteoporosis is a disease in which the bones lose minerals and strength as we age. Men ages 61 and over should discuss this with their caregivers  Lab tests are generally done as part of preventative care to screen for anemia and blood disorders, to screen for problems with the kidneys and liver, to screen for bladder problems, to check blood sugar, and to check your cholesterol level.  Preventative services generally include counseling about diet, exercise, avoiding tobacco, drugs, excessive alcohol consumption, and sexually transmitted infections.   Xrays and CT scans are not normally done as a preventative test, and most insurances do not pay for imaging for screening other than as discussed under cancer screens below.   On the other hand, if you have certain medical concerns, imaging may be necessary as a diagnostic test.    Your Medical Team Your medical team starts with  [place], your PCP or primary care provider.  Please use our services for your routine care such as physicals, screenings, immunizations, sick visits, and your first stop for general medical concerns.  You can call our number for after hours information for urgent questions that may need attention but cannot wait til the next business day.    Urgent care-urgent cares exist to provide care when your primary care office would typically be closed such as evenings or weekends.   Urgent care is for evaluation of urgent medical problems that do not necessarily require emergency department care, but cannot wait til the next business day when we are open.  Emergency department care-please reserve emergency department care for serious, urgent, possibly life-threatening medical problems.  This includes issues like possible stroke, heart attack, significant injury, mental health crisis, or other urgent need that requires immediate medical attention.     See your dentist office twice yearly for hygiene and cleaning visits.   Brush your teeth and floss your teeth daily.  See your eye doctor yearly for routine eye exam and screenings for glaucoma and retinal disease.   Specific Concerns: Follow up with your heart doctor soon as you are due   Vaccines:  Due to history of diabetes and heart disease, it is recommended you have the following vaccines to prevent illness.     Shingrix shingles vaccine Tetanus booster every 10 years Pneumococcoal 23 and Prevnar 13 pneuonia vaccines Covid vaccine Yearly flu shot Hepatitis B if not immune  Having diabetes and being older than 50 puts  you at higher risk for numerous diseases such as pneumonia and covid infection.  Having diabetes puts you at risk for infection or not healing from wounds.   Schedule if you want to do any of these vaccines    Stay up to date with your tetanus shots and other required immunizations. You should have a booster for tetanus every 10  years. Be sure to get your flu shot every year, since 5%-20% of the [place] population comes down with the flu. The flu vaccine changes each year, so being vaccinated once is not enough. Get your shot in the fall, before the flu season peaks.   Other vaccines to consider:  Pneumococcal vaccine to protect against certain types of pneumonia.  This is normally recommended for adults age 61 or older.  However, adults younger than 61 years old with certain underlying conditions such as diabetes, heart or lung disease should also receive the vaccine.  Shingles vaccine to protect against Varicella Zoster if you are older than age 67, or younger than 61 years old with certain underlying illness.  If you have not had the Shingrix vaccine, please call your insurer to inquire about coverage for the Shingrix vaccine given in 2 doses.   Some insurers cover this vaccine after age 41, some cover this after age 61.  If your insurer covers this, then call to schedule appointment to have this vaccine here  Hepatitis A vaccine to protect against a form of infection of the liver by a virus acquired from food.  Hepatitis B vaccine to protect against a form of infection of the liver by a virus acquired from blood or body fluids, particularly if you work in health care.  If you plan to travel internationally, check with your local health department for specific vaccination recommendations.  Human Papilloma Virus or HPV causes cancer of the cervix, and other infections that can be transmitted from person to person. There is a vaccine for HPV, and males should get immunized between the ages of 61 and 61. It requires a series of 3 shots.   Covid/Coronavirus - as the vaccines are becoming available, please consider vaccination if you are a health care worker, first responder, or have significant health problems such as asthma, COPD, heart disease, hypertension, diabetes, obesity, multiple medical problems, over age 61yo, or  immunocompromised.       What should I know about Cancer screening? Many types of cancers can be detected early and may often be prevented. Lung Cancer  You should be screened every year for lung cancer if: ? You are a current smoker who has smoked for at least 30 years. ? You are a former smoker who has quit within the past 15 years.  Talk to your health care provider about your screening options, when you should start screening, and how often you should be screened.  Colorectal Cancer  Routine colorectal cancer screening usually begins at 61 years of age and should be repeated every 5-10 years until you are 61 years old. You may need to be screened more often if early forms of precancerous polyps or small growths are found. Your health care provider may recommend screening at an earlier age if you have risk factors for colon cancer.  Your health care provider may recommend using home test kits to check for hidden blood in the stool.  A small camera at the end of a tube can be used to examine your colon (sigmoidoscopy or colonoscopy). This checks for the  earliest forms of colorectal cancer.  Prostate and Testicular Cancer  Depending on your age and overall health, your health care provider may do certain tests to screen for prostate and testicular cancer.  Talk to your health care provider about any symptoms or concerns you have about testicular or prostate cancer.  Skin Cancer  Check your skin from head to toe regularly.  Tell your health care provider about any new moles or changes in moles, especially if: ? There is a change in a mole's size, shape, or color. ? You have a mole that is larger than a pencil eraser.  Always use sunscreen. Apply sunscreen liberally and repeat throughout the day.  Protect yourself by wearing long sleeves, pants, a wide-brimmed hat, and sunglasses when outside.   Are you up to date on cancer screenings:  COLON CANCER SCREENING:  PLEASE DO YOUR  COLOGUARD TEST!!  PROSTATE CANCER SCREENING:  We are checking PSA test    GENERAL RECOMMENDATIONS FOR GOOD HEALTH:  Healthy diet:  Eat a variety of foods, including fruit, vegetables, animal or vegetable protein, such as meat, fish, chicken, and eggs, or beans, lentils, tofu, and grains, such as rice.  Drink plenty of water daily.  Decrease saturated fat in the diet, avoid lots of red meat, processed foods, sweets, fast foods, and fried foods.  Exercise:  Aerobic exercise helps maintain good heart health. At least 30-40 minutes of moderate-intensity exercise is recommended. For example, a brisk walk that increases your heart rate and breathing. This should be done on most days of the week.   Find a type of exercise or a variety of exercises that you enjoy so that it becomes a part of your daily life.  Examples are running, walking, swimming, water aerobics, and biking.  For motivation and support, explore group exercise such as aerobic class, spin class, Zumba, Yoga,or  martial arts, etc.    Set exercise goals for yourself, such as a certain weight goal, walk or run in a race such as a 5k walk/run.  Speak to your primary care provider about exercise goals.  Your weight readings per our records: Wt Readings from Last 3 Encounters:  09/23/19 289 lb 12.8 oz (131.5 kg)  10/01/18 280 lb (127 kg)  04/14/18 281 lb 6.4 oz (127.6 kg)    Body mass index is 39.3 kg/m.    Disease prevention:  If you smoke or chew tobacco, find out from your caregiver how to quit. It can literally save your life, no matter how long you have been a tobacco user. If you do not use tobacco, never begin.   Maintain a healthy diet and normal weight. Increased weight leads to problems with blood pressure and diabetes.   The Body Mass Index or BMI is a way of measuring how much of your body is fat. Having a BMI above 27 increases the risk of heart disease, diabetes, hypertension, stroke and other problems related  to obesity. Your caregiver can help determine your BMI and based on it develop an exercise and dietary program to help you achieve or maintain this important measurement at a healthful level.  High blood pressure causes heart and blood vessel problems.  Persistent high blood pressure should be treated with medicine if weight loss and exercise do not work.  Your blood pressure readings per our records:     BP Readings from Last 3 Encounters:  09/23/19 134/88  10/01/18 122/71  04/14/18 120/78     Fat and cholesterol leaves  deposits in your arteries that can block them. This causes heart disease and vessel disease elsewhere in your body.  If your cholesterol is found to be high, or if you have heart disease or certain other medical conditions, then you may need to have your cholesterol monitored frequently and be treated with medication.   Osteoporosis is a disease in which the bones lose minerals and strength as we age. This can result in serious bone fractures. Risk of osteoporosis can be identified using a bone density scan. Men ages 38 and over should discuss this with their caregivers. Ask your caregiver whether you should be taking a calcium supplement and Vitamin D, to reduce the rate of osteoporosis.   Avoid drinking alcohol in excess (more than two drinks per day).  Avoid use of street drugs. Do not share needles with anyone. Ask for professional help if you need assistance or instructions on stopping the use of alcohol, cigarettes, and/or drugs.  Brush your teeth twice a day with fluoride toothpaste, and floss once a day. Good oral hygiene prevents tooth decay and gum disease. The problems can be painful, unattractive, and can cause other health problems. Visit your dentist for a routine oral and dental check up and preventive care every 6-12 months.   Safety:  Use seatbelts 100% of the time, whether driving or as a passenger.  Use safety devices such as hearing protection if you work in  environments with loud noise or significant background noise.  Use safety glasses when doing any work that could send debris in to the eyes.  Use a helmet if you ride a bike or motorcycle.  Use appropriate safety gear for contact sports.  Talk to your caregiver about gun safety.  Use sunscreen with a SPF (or skin protection factor) of 15 or greater.  Lighter skinned people are at a greater risk of skin cancer. Don't forget to also wear sunglasses in order to protect your eyes from too much damaging sunlight. Damaging sunlight can accelerate cataract formation.   Keep carbon monoxide and smoke detectors in your home functioning at all times. Change the batteries every 6 months or use a model that plugs into the wall.    Sexual activity: . Sex is a normal part of life and sexual activity can continue into older adulthood for many healthy people.   . If you are having erectile dysfunction issues, please follow up to discuss this further.   . If you are not in a monogamous relationship or have more than one partner, please practice safe sex.  Use condoms. Condoms are used for birth control and to help reduce the spread of sexually transmitted infections (or STIs).  Some of the STIs are gonorrhea (the clap), chlamydia, syphilis, trichomonas, herpes, HPV (human papilloma virus) and HIV (human immunodeficiency virus) which causes AIDS. The herpes, HIV and HPV are viral illnesses that have no cure. These can result in disability, cancer and death.   We are able to test for STIs here at our office.

## 2019-09-23 NOTE — Progress Notes (Signed)
Subjective:   HPI  Joshua Howell is a 59 y.o. male who presents for Chief Complaint  Patient presents with  . Annual Exam    with fasting labs     Patient Care Team: Ryszard Socarras, Camelia Eng, PA-C as PCP - General (Family Medicine) Dorothy Spark, MD as PCP - Cardiology (Cardiology) Sees dentist Sees eye doctor  Concerns: Sometimes left hand, 4th finger with lock, has trigger finger.  This is worse if squeezing hand tight.  This has been going a month.  Diabetes - checking glucose every few days.  He can't give me numbers but says most of the time they are good.    Taking Metformin 1000mg  BID.  He quit Jardiance a while back due to [place].   No problems with metformin BID.  Hx/o MI, CABG - doesn't check BPs.    Moving up towards [place] closer to his daughter  Hyperlipidemia - Compliant with Lipitor 80mg  and Aspirin 81mg  daily    Reviewed their medical, surgical, family, social, medication, and allergy history and updated chart as appropriate.  Past Medical History:  Diagnosis Date  . CAD (coronary artery disease)    a. NSTEMI 01/2017 s/p CABG.  . Carotid arterial disease ([place])    a. mild 1-39% 01/2017.  Marland Kitchen Dyslipidemia   . Migraine    "only in [employer]" (01/23/2017)  . Morbid obesity ([place])   . NSTEMI (non-ST elevated myocardial infarction) ([place]) 01/23/2017  . Type I diabetes mellitus ([place]) 07/2016    Past Surgical History:  Procedure Laterality Date  . ANTERIOR CERVICAL DECOMP/DISCECTOMY FUSION     "took a piece off my right hip hip & put it in there"  . BACK SURGERY    . CORONARY ARTERY BYPASS GRAFT N/A 01/28/2017   Procedure: CORONARY ARTERY BYPASS GRAFTING (CABG ) x 4 USING LEFT INTERNAL MAMMARY ARTERY AND ENDOSCOPIC HARVESTING OF RIGHT SAPHENOUS VEIN,  LIMA-LAD SVG-RAMUS SEQ SVG-PD-PL;  Surgeon: Melrose Nakayama, MD;  Location: [facility];  Service: Open Heart Surgery;  Laterality: N/A;  . LEFT HEART CATH AND CORONARY ANGIOGRAPHY N/A 01/24/2017    Procedure: LEFT HEART CATH AND CORONARY ANGIOGRAPHY;  Surgeon: Jettie Booze, MD;  Location: [facility];  Service: Cardiovascular;  Laterality: N/A;  . TEE WITHOUT CARDIOVERSION N/A 01/28/2017   Procedure: TRANSESOPHAGEAL ECHOCARDIOGRAM (TEE);  Surgeon: Melrose Nakayama, MD;  Location: [facility];  Service: Open Heart Surgery;  Laterality: N/A;    Social History   Socioeconomic History  . Marital status: Widowed    Spouse name: Not on file  . Number of children: Not on file  . Years of education: Not on file  . Highest education level: Not on file  Occupational History  . Not on file  Tobacco Use  . Smoking status: Former Smoker    Types: Cigarettes  . Smokeless tobacco: Never Used  . Tobacco comment: 01/23/2017 "quit smoking in the 1990s"  Substance and Sexual Activity  . Alcohol use: Yes    Alcohol/week: 0.0 standard drinks    Comment: 01/23/2017 "maybe 4 beers/month"  . Drug use: No  . Sexual activity: Not Currently  Other Topics Concern  . Not on file  Social History Narrative   Lives alone.   [employer].  09/2019   Social Determinants of Health   Financial Resource Strain:   . Difficulty of Paying Living Expenses:   Food Insecurity:   . Worried About Charity fundraiser in the Last Year:   .  Ran Out of Food in the Last Year:   Transportation Needs:   . Freight forwarder (Medical):   Marland Kitchen Lack of Transportation (Non-Medical):   Physical Activity:   . Days of Exercise per Week:   . Minutes of Exercise per Session:   Stress:   . Feeling of Stress :   Social Connections:   . Frequency of Communication with Friends and Family:   . Frequency of Social Gatherings with Friends and Family:   . Attends Religious Services:   . Active Member of Clubs or Organizations:   . Attends Banker Meetings:   Marland Kitchen Marital Status:   Intimate Partner Violence:   . Fear of Current or Ex-Partner:   . Emotionally Abused:   Marland Kitchen Physically Abused:    . Sexually Abused:     Family History  Problem Relation Age of Onset  . Gallbladder disease Mother   . Aneurysm Mother        brain  . Stroke Mother   . Heart disease Father 55       3 vessel ABG  . Heart disease Brother 45       MI  . Other Brother        died of hemorrhage in head after fall     Current Outpatient Medications:  .  aspirin EC 81 MG tablet, Take 1 tablet (81 mg total) by mouth daily., Disp: 90 tablet, Rfl: 3 .  atorvastatin (LIPITOR) 80 MG tablet, TAKE 1 TABLET BY MOUTH  DAILY AT 6 PM, Disp: 90 tablet, Rfl: 3 .  metFORMIN (GLUCOPHAGE) 1000 MG tablet, Take 1 tablet (1,000 mg total) by mouth 2 (two) times daily with a meal., Disp: 180 tablet, Rfl: 1 .  empagliflozin (JARDIANCE) 25 MG TABS tablet, Take 25 mg by mouth daily. (Patient not taking: Reported on 09/23/2019), Disp: 90 tablet, Rfl: 0  No Known Allergies     Review of Systems Constitutional: -fever, -chills, -sweats, -unexpected weight change, -decreased appetite, -fatigue Allergy: -sneezing, -itching, -congestion Dermatology: -changing moles, --rash, -lumps ENT: -runny nose, -ear pain, -sore throat, -hoarseness, -sinus pain, -teeth pain, - ringing in ears, -hearing loss, -nosebleeds Cardiology: -chest pain, -palpitations, -swelling, -difficulty breathing when lying flat, -waking up short of breath Respiratory: -cough, -shortness of breath, -difficulty breathing with exercise or exertion, -wheezing, -coughing up blood Gastroenterology: -abdominal pain, -nausea, -vomiting, -diarrhea, -constipation, -blood in stool, -changes in bowel movement, -difficulty swallowing or eating Hematology: -bleeding, -bruising  Musculoskeletal: -joint aches, -muscle aches, -joint swelling, -back pain, -neck pain, -cramping, -changes in gait Ophthalmology: denies vision changes, eye redness, itching, discharge Urology: -burning with urination, -difficulty urinating, -blood in urine, -urinary frequency, -urgency,  -incontinence Neurology: -headache, -weakness, -tingling, -numbness, -memory loss, -falls, -dizziness Psychology: -depressed mood, -agitation, -sleep problems Male GU: no testicular mass, pain, no lymph nodes swollen, no swelling, no rash.     Objective:  BP 134/88   Pulse 78   Temp 97.8 F (36.6 C)   Ht 6' (1.829 m)   Wt 289 lb 12.8 oz (131.5 kg)   SpO2 97%   BMI 39.30 kg/m   General appearance: alert, no distress, WD/WN, Caucasian male Skin: dry skin left internal pinna and right superior pinna, scattered macules, some yellowish bruising on lower abdomen, no other worrisome lesions Neck: supple, no lymphadenopathy, no thyromegaly, no masses, normal ROM, no bruits Chest: vertical CABG scar, non tender, normal shape and expansion Heart: RRR, normal S1, S2, no murmurs Lungs: CTA bilaterally, no wheezes, rhonchi, or  rales Abdomen: +bs, soft, non tender, non distended, no masses, no hepatomegaly, no splenomegaly, no bruits Back: non tender, normal ROM, no scoliosis Musculoskeletal:  Left ring finger locks in flexion, c/w trigger finger, otherwise upper extremities non tender, no obvious deformity, normal ROM throughout, lower extremities non tender, no obvious deformity, normal ROM throughout Extremities: no edema, no cyanosis, no clubbing Pulses: 2+ symmetric, upper and lower extremities, normal cap refill Neurological: alert, oriented x 3, CN2-12 intact, strength normal upper extremities and lower extremities, sensation normal throughout, DTRs 2+ throughout, no cerebellar signs, gait normal Psychiatric: normal affect, behavior normal, pleasant  GU: normal male external genitalia,circumcised, nontender, no masses, no hernia, no lymphadenopathy Rectal: declined   Assessment and Plan :   Encounter Diagnoses  Name Primary?  . Encounter for health maintenance examination in adult Yes  . Uncontrolled type 2 diabetes mellitus with complication, without long-term current use of insulin  (HCC)   . NSTEMI (non-ST elevated myocardial infarction) (HCC)   . Morbid obesity (HCC)   . Mixed dyslipidemia   . Family history of premature CAD   . S/P CABG x 4   . Erectile dysfunction, unspecified erectile dysfunction type   . Vaccine refused by patient   . Noncompliance   . Screening for prostate cancer   . Screen for colon cancer   . Allergic rhinitis due to pollen, unspecified seasonality   . Trigger ring finger of left hand   . Encounter for screening for vascular disease   . Decreased pulse   . Stenosis of carotid artery, unspecified laterality     Physical exam - discussed and counseled on healthy lifestyle, diet, exercise, preventative care, vaccinations, sick and well care, proper use of emergency dept and after hours care, and addressed their concerns.    Health screening: See your eye doctor yearly for routine vision care. See your dentist yearly for routine dental care including hygiene visits twice yearly. Advised he follow up with cardiology soon as he is due  Cancer screening Again reminded him to complete Cologuard. He has it at home but just hasn't completed it  Discussed PSA, prostate exam, and prostate cancer screening risks/benefits.     Discussed skin monitoring    Vaccinations: He declines all vaccines! Counseled on Td, Shingrix, covid, pneumococcal vaccines   Acute issues discussed: none  Separate significant chronic issues discussed: We discussed the fact that he is past due on many screenings and routine care.  He should be coming in at minimum twice yearly.  He has not been hear in over a year  Diabetes-needs to be checking sugars more regularly, needs to be coming in for regular f/u.  Labs today.  Noncompliant with Jardiance.  He is compliant with metformin  Hx/o MI, CABG - reminded him to follow-up with cardiology as he is due  Obesity-advised the need to lose weight through healthy diet and exercise  Trigger finger-referral to  orthopedics for further eval and treatment  Decreased pulses, diabetes-recommend an ABI  Carotid stenosis-advised updated ultrasound  Dyslipidemia-compliant, labs today   Pranit was seen today for annual exam.  Diagnoses and all orders for this visit:  Encounter for health maintenance examination in adult -     Comprehensive metabolic panel -     CBC with Differential/Platelet -     Lipid panel -     Hemoglobin A1c -     Microalbumin/Creatinine Ratio, Urine -     PSA  Uncontrolled type 2 diabetes mellitus with complication, without  long-term current use of insulin (HCC) -     Comprehensive metabolic panel -     Hemoglobin A1c -     Microalbumin/Creatinine Ratio, Urine -     VAS [place] ABI WITH/WO TBI; Future  NSTEMI (non-ST elevated myocardial infarction) (HCC) -     US Carotid Duplex Bilateral; Future  Morbid obesity (HCC)  Mixed dyslipidemia -     Lipid panel  Family history of premature CAD  S/P CABG x 4  Erectile dysfunction, unspecified erectile dysfunction type  Vaccine refused by patient  Noncompliance  Screening for prostate cancer  Screen for colon cancer -     PSA  Allergic rhinitis due to pollen, unspecified seasonality  Trigger ring finger of left hand  Encounter for screening for vascular disease -     US Carotid Duplex Bilateral; Future  Decreased pulse -     VAS [place] ABI WITH/WO TBI; Future  Stenosis of carotid artery, unspecified laterality    Follow-up pending labs, yearly for physical

## 2019-09-23 NOTE — Telephone Encounter (Signed)
Ortho referral has been sent. Awaiting lab results per [facility] for other recommendations.

## 2019-09-23 NOTE — Addendum Note (Signed)
Addended by: Victorio Palm on: 09/23/2019 10:23 AM   Modules accepted: Orders

## 2019-09-24 ENCOUNTER — Other Ambulatory Visit: Payer: Self-pay | Admitting: Medical

## 2019-09-24 LAB — CBC WITH DIFFERENTIAL/PLATELET
Basophils Absolute: 0 10*3/uL (ref 0.0–0.2)
Basos: 0 %
EOS (ABSOLUTE): 0.2 10*3/uL (ref 0.0–0.4)
Eos: 3 %
Hematocrit: 47.8 % (ref 37.5–51.0)
Hemoglobin: 16.1 g/dL (ref 13.0–17.7)
Immature Grans (Abs): 0 10*3/uL (ref 0.0–0.1)
Immature Granulocytes: 0 %
Lymphocytes Absolute: 1.7 10*3/uL (ref 0.7–3.1)
Lymphs: 23 %
MCH: 32.1 pg (ref 26.6–33.0)
MCHC: 33.7 g/dL (ref 31.5–35.7)
MCV: 95 fL (ref 79–97)
Monocytes Absolute: 0.6 10*3/uL (ref 0.1–0.9)
Monocytes: 9 %
Neutrophils Absolute: 4.7 10*3/uL (ref 1.4–7.0)
Neutrophils: 65 %
Platelets: 181 10*3/uL (ref 150–450)
RBC: 5.02 x10E6/uL (ref 4.14–5.80)
RDW: 12.9 % (ref 11.6–15.4)
WBC: 7.3 10*3/uL (ref 3.4–10.8)

## 2019-09-24 LAB — COMPREHENSIVE METABOLIC PANEL
ALT: 27 IU/L (ref 0–44)
AST: 33 IU/L (ref 0–40)
Albumin/Globulin Ratio: 1.5 (ref 1.2–2.2)
Albumin: 4.7 g/dL (ref 3.8–4.8)
Alkaline Phosphatase: 87 IU/L (ref 39–117)
BUN/Creatinine Ratio: 15 (ref 10–24)
BUN: 16 mg/dL (ref 8–27)
Bilirubin Total: 1.4 mg/dL — ABNORMAL HIGH (ref 0.0–1.2)
CO2: 22 mmol/L (ref 20–29)
Calcium: 9.5 mg/dL (ref 8.6–10.2)
Chloride: 102 mmol/L (ref 96–106)
Creatinine, Ser: 1.07 mg/dL (ref 0.76–1.27)
GFR calc Af Amer: 86 mL/min/{1.73_m2} (ref >59)
GFR calc non Af Amer: 75 mL/min/{1.73_m2} (ref >59)
Globulin, Total: 3.2 g/dL (ref 1.5–4.5)
Glucose: 147 mg/dL — ABNORMAL HIGH (ref 65–99)
Potassium: 5.1 mmol/L (ref 3.5–5.2)
Sodium: 139 mmol/L (ref 134–144)
Total Protein: 7.9 g/dL (ref 6.0–8.5)

## 2019-09-24 LAB — LIPID PANEL
Chol/HDL Ratio: 2 ratio (ref 0.0–5.0)
Cholesterol, Total: 86 mg/dL — ABNORMAL LOW (ref 100–199)
HDL: 43 mg/dL (ref >39)
LDL Chol Calc (NIH): 25 mg/dL (ref 0–99)
Triglycerides: 89 mg/dL (ref 0–149)
VLDL Cholesterol Cal: 18 mg/dL (ref 5–40)

## 2019-09-24 LAB — MICROALBUMIN / CREATININE URINE RATIO
Creatinine, Urine: 126.5 mg/dL
Microalb/Creat Ratio: 6 mg/g creat (ref 0–29)
Microalbumin, Urine: 8.2 ug/mL

## 2019-09-24 LAB — HEMOGLOBIN A1C
Est. average glucose Bld gHb Est-mCnc: 171 mg/dL
Hgb A1c MFr Bld: 7.6 % — ABNORMAL HIGH (ref 4.8–5.6)

## 2019-09-24 LAB — PSA: Prostate Specific Ag, Serum: 1.1 ng/mL (ref 0.0–4.0)

## 2019-09-24 MED ORDER — ASPIRIN EC 81 MG PO TBEC: 81.0000 mg | DELAYED_RELEASE_TABLET | Freq: Every day | ORAL | 3 refills | Status: DC

## 2019-09-24 MED ORDER — METFORMIN HCL 1000 MG PO TABS: 1000.0000 mg | ORAL_TABLET | Freq: Two times a day (BID) | ORAL | 1 refills | Status: DC

## 2019-10-05 ENCOUNTER — Ambulatory Visit (HOSPITAL_COMMUNITY): Payer: BC Managed Care – PPO

## 2019-10-06 ENCOUNTER — Ambulatory Visit (INDEPENDENT_AMBULATORY_CARE_PROVIDER_SITE_OTHER): Payer: BC Managed Care – PPO | Admitting: Orthopaedic Surgery

## 2019-10-06 ENCOUNTER — Other Ambulatory Visit: Payer: Self-pay

## 2019-10-06 ENCOUNTER — Encounter: Payer: Self-pay | Admitting: Orthopaedic Surgery

## 2019-10-06 DIAGNOSIS — M65342 Trigger finger, left ring finger: Secondary | ICD-10-CM | POA: Diagnosis not present

## 2019-10-06 DIAGNOSIS — E113293 Type 2 diabetes mellitus with mild nonproliferative diabetic retinopathy without macular edema, bilateral: Secondary | ICD-10-CM | POA: Diagnosis not present

## 2019-10-06 LAB — HM DIABETES EYE EXAM

## 2019-10-06 MED ORDER — METHYLPREDNISOLONE 4 MG PO TBPK: ORAL_TABLET | ORAL | 0 refills | Status: DC

## 2019-10-06 NOTE — Progress Notes (Signed)
Office Visit Note   Patient: Joshua Howell           Date of Birth: [birth year]           MRN: [id number] Visit Date: 10/06/2019              Requested by: Carlena Hurl, PA-C [address] [place],  [place] [address] PCP: Carlena Hurl, PA-C   Assessment & Plan: Visit Diagnoses:  1. Trigger finger, left ring finger     Plan: Impression is left ring trigger finger.  He feels that since it only bothers him with certain activities he would like to just start with a Medrol Dosepak today.  He will hold off on injection until it becomes more persistent.  Follow-Up Instructions: Return if symptoms worsen or fail to improve.   Orders:  No orders of the defined types were placed in this encounter.  Meds ordered this encounter  Medications  . methylPREDNISolone (MEDROL DOSEPAK) 4 MG TBPK tablet    Sig: Use as directed    Dispense:  21 tablet    Refill:  0      Procedures: No procedures performed   Clinical Data: No additional findings.   Subjective: Chief Complaint  Patient presents with  . Left Ring Finger - Pain    Joshua Howell is a 59 year old gentleman comes in for occasional triggering of his left ring finger.  He notices it mainly when he makes a really tight fist or when he grips a wrench.  He states that otherwise it does not bother him.  Denies any swelling.  He is diabetic.   Review of Systems  Constitutional: Negative.   All other systems reviewed and are negative.    Objective: Vital Signs: There were no vitals taken for this visit.  Physical Exam Vitals and nursing note reviewed.  Constitutional:      Appearance: He is well-developed.  HENT:     Head: Normocephalic and atraumatic.  Eyes:     Pupils: Pupils are equal, round, and reactive to light.  Pulmonary:     Effort: Pulmonary effort is normal.  Abdominal:     Palpations: Abdomen is soft.  Musculoskeletal:        General: Normal range of motion.     Cervical back: Neck supple.    Skin:    General: Skin is warm.  Neurological:     Mental Status: He is alert and oriented to person, place, and time.  Psychiatric:        Behavior: Behavior normal.        Thought Content: Thought content normal.        Judgment: Judgment normal.     Ortho Exam Left hand shows triggering of the ring finger that is palpable and audible when he makes a tight fist.  He has no significant tenderness palpation of the flexor tendon. Specialty Comments:  No specialty comments available.  Imaging: No results found.   PMFS History: Patient Active Problem List   Diagnosis Date Noted  . Noncompliance 09/23/2019  . Allergic rhinitis due to pollen 09/23/2019  . Trigger ring finger of left hand 09/23/2019  . Decreased pulse 09/23/2019  . Stenosis of carotid artery 09/23/2019  . Vaccine refused by patient 04/14/2018  . Encounter for screening for vascular disease 04/14/2018  . Erectile dysfunction 10/23/2017  . 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 Family history of premature CAD 01/23/2017  . NSTEMI (non-ST elevated myocardial infarction) ([place]) 01/23/2017  .  Mixed dyslipidemia 06/26/2016  . Uncontrolled type 2 diabetes mellitus with complication, without long-term current use of insulin (HCC) 09/03/2014  . Morbid obesity (HCC) 09/03/2014   Past Medical History:  Diagnosis Date  . CAD (coronary artery disease)    a. NSTEMI 01/2017 s/p CABG.  . Carotid arterial disease (HCC)    a. mild 1-39% 01/2017.  Marland Kitchen Dyslipidemia   . Migraine    "only in [employer]" (01/23/2017)  . Morbid obesity (HCC)   . NSTEMI (non-ST elevated myocardial infarction) (HCC) 01/23/2017  . Type I diabetes mellitus (HCC) 07/2016    Family History  Problem Relation Age of Onset  . Gallbladder disease Mother   . Aneurysm Mother        brain  . Stroke Mother   . Heart disease Father 55       3 vessel ABG  . Heart disease Brother 45       MI  . Other Brother        died of hemorrhage in head after fall     Past Surgical History:  Procedure Laterality Date  . ANTERIOR CERVICAL DECOMP/DISCECTOMY FUSION     "took a piece off my right hip hip & put it in there"  . BACK SURGERY    . CORONARY ARTERY BYPASS GRAFT N/A 01/28/2017   Procedure: CORONARY ARTERY BYPASS GRAFTING (CABG ) x 4 USING LEFT INTERNAL MAMMARY ARTERY AND ENDOSCOPIC HARVESTING OF RIGHT SAPHENOUS VEIN,  LIMA-LAD SVG-RAMUS SEQ SVG-PD-PL;  Surgeon: [name], MD;  Location: [facility];  Service: Open Heart Surgery;  Laterality: N/A;  . LEFT HEART CATH AND CORONARY ANGIOGRAPHY N/A 01/24/2017   Procedure: LEFT HEART CATH AND CORONARY ANGIOGRAPHY;  Surgeon: Corky Crafts, MD;  Location: [facility];  Service: Cardiovascular;  Laterality: N/A;  . TEE WITHOUT CARDIOVERSION N/A 01/28/2017   Procedure: TRANSESOPHAGEAL ECHOCARDIOGRAM (TEE);  Surgeon: [name], MD;  Location: [facility] OR;  Service: Open Heart Surgery;  Laterality: N/A;   Social History   Occupational History  . Not on file  Tobacco Use  . Smoking status: Former Smoker    Types: Cigarettes  . Smokeless tobacco: Never Used  . Tobacco comment: 01/23/2017 "quit smoking in the 1990s"  Substance and Sexual Activity  . Alcohol use: Yes    Alcohol/week: 0.0 standard drinks    Comment: 01/23/2017 "maybe 4 beers/month"  . Drug use: No  . Sexual activity: Not Currently

## 2019-11-23 ENCOUNTER — Emergency Department (HOSPITAL_COMMUNITY): Payer: BC Managed Care – PPO

## 2019-11-23 ENCOUNTER — Encounter (HOSPITAL_COMMUNITY): Payer: Self-pay | Admitting: Emergency Medicine

## 2019-11-23 ENCOUNTER — Other Ambulatory Visit: Payer: Self-pay

## 2019-11-23 ENCOUNTER — Inpatient Hospital Stay (HOSPITAL_COMMUNITY)
Admission: EM | Admit: 2019-11-23 | Discharge: 2019-11-29 | DRG: 177 | Disposition: A | Discharge status: Home / Self Care | Payer: BC Managed Care – PPO | Attending: Internal Medicine | Admitting: Internal Medicine

## 2019-11-23 DIAGNOSIS — J9601 Acute respiratory failure with hypoxia: Secondary | ICD-10-CM | POA: Diagnosis present

## 2019-11-23 DIAGNOSIS — Z7982 Long term (current) use of aspirin: Secondary | ICD-10-CM

## 2019-11-23 DIAGNOSIS — R609 Edema, unspecified: Secondary | ICD-10-CM | POA: Diagnosis not present

## 2019-11-23 DIAGNOSIS — E875 Hyperkalemia: Secondary | ICD-10-CM | POA: Diagnosis not present

## 2019-11-23 DIAGNOSIS — B370 Candidal stomatitis: Secondary | ICD-10-CM | POA: Diagnosis not present

## 2019-11-23 DIAGNOSIS — T380X5A Adverse effect of glucocorticoids and synthetic analogues, initial encounter: Secondary | ICD-10-CM | POA: Diagnosis not present

## 2019-11-23 DIAGNOSIS — E1065 Type 1 diabetes mellitus with hyperglycemia: Secondary | ICD-10-CM | POA: Diagnosis not present

## 2019-11-23 DIAGNOSIS — Z713 Dietary counseling and surveillance: Secondary | ICD-10-CM

## 2019-11-23 DIAGNOSIS — R0602 Shortness of breath: Secondary | ICD-10-CM | POA: Diagnosis not present

## 2019-11-23 DIAGNOSIS — Z794 Long term (current) use of insulin: Secondary | ICD-10-CM

## 2019-11-23 DIAGNOSIS — E782 Mixed hyperlipidemia: Secondary | ICD-10-CM | POA: Diagnosis not present

## 2019-11-23 DIAGNOSIS — IMO0002 Reserved for concepts with insufficient information to code with codable children: Secondary | ICD-10-CM | POA: Diagnosis present

## 2019-11-23 DIAGNOSIS — J80 Acute respiratory distress syndrome: Secondary | ICD-10-CM | POA: Diagnosis not present

## 2019-11-23 DIAGNOSIS — I700 Atherosclerosis of aorta: Secondary | ICD-10-CM | POA: Diagnosis not present

## 2019-11-23 DIAGNOSIS — Z87891 Personal history of nicotine dependence: Secondary | ICD-10-CM | POA: Diagnosis not present

## 2019-11-23 DIAGNOSIS — E669 Obesity, unspecified: Secondary | ICD-10-CM | POA: Diagnosis not present

## 2019-11-23 DIAGNOSIS — U071 COVID-19: Secondary | ICD-10-CM | POA: Diagnosis not present

## 2019-11-23 DIAGNOSIS — Z981 Arthrodesis status: Secondary | ICD-10-CM

## 2019-11-23 DIAGNOSIS — I252 Old myocardial infarction: Secondary | ICD-10-CM | POA: Diagnosis not present

## 2019-11-23 DIAGNOSIS — J984 Other disorders of lung: Secondary | ICD-10-CM | POA: Diagnosis not present

## 2019-11-23 DIAGNOSIS — E871 Hypo-osmolality and hyponatremia: Secondary | ICD-10-CM | POA: Diagnosis present

## 2019-11-23 DIAGNOSIS — R21 Rash and other nonspecific skin eruption: Secondary | ICD-10-CM | POA: Diagnosis not present

## 2019-11-23 DIAGNOSIS — R7401 Elevation of levels of liver transaminase levels: Secondary | ICD-10-CM | POA: Diagnosis not present

## 2019-11-23 DIAGNOSIS — Z951 Presence of aortocoronary bypass graft: Secondary | ICD-10-CM | POA: Diagnosis not present

## 2019-11-23 DIAGNOSIS — I2510 Atherosclerotic heart disease of native coronary artery without angina pectoris: Secondary | ICD-10-CM | POA: Diagnosis present

## 2019-11-23 DIAGNOSIS — E118 Type 2 diabetes mellitus with unspecified complications: Secondary | ICD-10-CM | POA: Diagnosis not present

## 2019-11-23 DIAGNOSIS — Z79899 Other long term (current) drug therapy: Secondary | ICD-10-CM

## 2019-11-23 DIAGNOSIS — R0902 Hypoxemia: Secondary | ICD-10-CM | POA: Diagnosis not present

## 2019-11-23 DIAGNOSIS — J301 Allergic rhinitis due to pollen: Secondary | ICD-10-CM | POA: Diagnosis not present

## 2019-11-23 DIAGNOSIS — Z6837 Body mass index (BMI) 37.0-37.9, adult: Secondary | ICD-10-CM | POA: Diagnosis not present

## 2019-11-23 DIAGNOSIS — J1282 Pneumonia due to coronavirus disease 2019: Secondary | ICD-10-CM | POA: Diagnosis not present

## 2019-11-23 DIAGNOSIS — E1165 Type 2 diabetes mellitus with hyperglycemia: Secondary | ICD-10-CM | POA: Diagnosis present

## 2019-11-23 DIAGNOSIS — I499 Cardiac arrhythmia, unspecified: Secondary | ICD-10-CM | POA: Diagnosis not present

## 2019-11-23 DIAGNOSIS — Z8249 Family history of ischemic heart disease and other diseases of the circulatory system: Secondary | ICD-10-CM

## 2019-11-23 DIAGNOSIS — R7989 Other specified abnormal findings of blood chemistry: Secondary | ICD-10-CM | POA: Diagnosis not present

## 2019-11-23 DIAGNOSIS — J90 Pleural effusion, not elsewhere classified: Secondary | ICD-10-CM | POA: Diagnosis not present

## 2019-11-23 DIAGNOSIS — E66812 Obesity, class 2: Secondary | ICD-10-CM | POA: Diagnosis present

## 2019-11-23 LAB — BASIC METABOLIC PANEL
Anion gap: 13 (ref 5–15)
BUN: 13 mg/dL (ref 8–23)
CO2: 23 mmol/L (ref 22–32)
Calcium: 8.5 mg/dL — ABNORMAL LOW (ref 8.9–10.3)
Chloride: 97 mmol/L — ABNORMAL LOW (ref 98–111)
Creatinine, Ser: 1.12 mg/dL (ref 0.61–1.24)
GFR calc Af Amer: >60 mL/min (ref >60)
GFR calc non Af Amer: >60 mL/min (ref >60)
Glucose, Bld: 226 mg/dL — ABNORMAL HIGH (ref 70–99)
Potassium: 4.7 mmol/L (ref 3.5–5.1)
Sodium: 133 mmol/L — ABNORMAL LOW (ref 135–145)

## 2019-11-23 LAB — CBG MONITORING, ED: Glucose-Capillary: 182 mg/dL — ABNORMAL HIGH (ref 70–99)

## 2019-11-23 LAB — HEPATIC FUNCTION PANEL
ALT: 68 U/L — ABNORMAL HIGH (ref 0–44)
AST: 93 U/L — ABNORMAL HIGH (ref 15–41)
Albumin: 2.8 g/dL — ABNORMAL LOW (ref 3.5–5.0)
Alkaline Phosphatase: 81 U/L (ref 38–126)
Bilirubin, Direct: 0.3 mg/dL — ABNORMAL HIGH (ref 0.0–0.2)
Indirect Bilirubin: 1.2 mg/dL — ABNORMAL HIGH (ref 0.3–0.9)
Total Bilirubin: 1.5 mg/dL — ABNORMAL HIGH (ref 0.3–1.2)
Total Protein: 7.8 g/dL (ref 6.5–8.1)

## 2019-11-23 LAB — CBC
HCT: 44 % (ref 39.0–52.0)
Hemoglobin: 14.9 g/dL (ref 13.0–17.0)
MCH: 31.9 pg (ref 26.0–34.0)
MCHC: 33.9 g/dL (ref 30.0–36.0)
MCV: 94.2 fL (ref 80.0–100.0)
Platelets: 281 10*3/uL (ref 150–400)
RBC: 4.67 MIL/uL (ref 4.22–5.81)
RDW: 12.7 % (ref 11.5–15.5)
WBC: 9.8 10*3/uL (ref 4.0–10.5)
nRBC: 0 % (ref 0.0–0.2)

## 2019-11-23 LAB — GLUCOSE, CAPILLARY
Glucose-Capillary: 261 mg/dL — ABNORMAL HIGH (ref 70–99)
Glucose-Capillary: 260 mg/dL — ABNORMAL HIGH (ref 70–99)

## 2019-11-23 LAB — C-REACTIVE PROTEIN: CRP: 23.5 mg/dL — ABNORMAL HIGH (ref <1.0)

## 2019-11-23 LAB — SARS CORONAVIRUS 2 BY RT PCR (HOSPITAL ORDER, PERFORMED IN ~~LOC~~ HOSPITAL LAB): SARS Coronavirus 2: POSITIVE — AB

## 2019-11-23 LAB — LACTATE DEHYDROGENASE: LDH: 366 U/L — ABNORMAL HIGH (ref 98–192)

## 2019-11-23 LAB — FIBRINOGEN: Fibrinogen: >800 mg/dL — ABNORMAL HIGH (ref 210–475)

## 2019-11-23 LAB — D-DIMER, QUANTITATIVE: D-Dimer, Quant: 1.82 ug/mL-FEU — ABNORMAL HIGH (ref 0.00–0.50)

## 2019-11-23 LAB — HIV ANTIBODY (ROUTINE TESTING W REFLEX): HIV Screen 4th Generation wRfx: NONREACTIVE

## 2019-11-23 LAB — FERRITIN: Ferritin: 1425 ng/mL — ABNORMAL HIGH (ref 24–336)

## 2019-11-23 LAB — PROCALCITONIN: Procalcitonin: 0.11 ng/mL

## 2019-11-23 MED ORDER — SODIUM CHLORIDE 0.9 % IV SOLN
100.0000 mg | Freq: Every day | INTRAVENOUS | Status: AC
  Administered 2019-11-24 – 2019-11-27 (×4): 100 mg via INTRAVENOUS
  Filled 2019-11-23 (×4): qty 20

## 2019-11-23 MED ORDER — POLYETHYLENE GLYCOL 3350 17 G PO PACK: 17.0000 g | PACK | Freq: Every day | ORAL | Status: DC | PRN

## 2019-11-23 MED ORDER — FLEET ENEMA 7-19 GM/118ML RE ENEM: 1.0000 | ENEMA | Freq: Once | RECTAL | Status: DC | PRN

## 2019-11-23 MED ORDER — ALBUTEROL SULFATE (2.5 MG/3ML) 0.083% IN NEBU: 5.0000 mg | INHALATION_SOLUTION | Freq: Once | RESPIRATORY_TRACT | Status: DC

## 2019-11-23 MED ORDER — NYSTATIN 100000 UNIT/ML MT SUSP
5.0000 mL | Freq: Four times a day (QID) | OROMUCOSAL | Status: DC
  Administered 2019-11-23 – 2019-11-29 (×24): 500000 [IU] via ORAL
  Filled 2019-11-23 (×24): qty 5

## 2019-11-23 MED ORDER — ALBUTEROL SULFATE HFA 108 (90 BASE) MCG/ACT IN AERS
2.0000 | INHALATION_SPRAY | RESPIRATORY_TRACT | Status: DC | PRN
  Administered 2019-11-25: 2 via RESPIRATORY_TRACT
  Filled 2019-11-23: qty 6.7

## 2019-11-23 MED ORDER — SODIUM CHLORIDE 0.9 % IV SOLN
200.0000 mg | Freq: Once | INTRAVENOUS | Status: AC
  Administered 2019-11-23: 200 mg via INTRAVENOUS
  Filled 2019-11-23: qty 40

## 2019-11-23 MED ORDER — SODIUM CHLORIDE 0.9% FLUSH
3.0000 mL | Freq: Two times a day (BID) | INTRAVENOUS | Status: DC
  Administered 2019-11-23 – 2019-11-29 (×12): 3 mL via INTRAVENOUS

## 2019-11-23 MED ORDER — SODIUM CHLORIDE 0.9 % IV SOLN: 250.0000 mL | INTRAVENOUS | Status: DC | PRN

## 2019-11-23 MED ORDER — ACETAMINOPHEN 325 MG PO TABS: 650.0000 mg | ORAL_TABLET | Freq: Four times a day (QID) | ORAL | Status: DC | PRN

## 2019-11-23 MED ORDER — SODIUM CHLORIDE 0.9% FLUSH: 3.0000 mL | INTRAVENOUS | Status: DC | PRN

## 2019-11-23 MED ORDER — ONDANSETRON HCL 4 MG PO TABS: 4.0000 mg | ORAL_TABLET | Freq: Four times a day (QID) | ORAL | Status: DC | PRN

## 2019-11-23 MED ORDER — INSULIN ASPART 100 UNIT/ML ~~LOC~~ SOLN
0.0000 [IU] | Freq: Every day | SUBCUTANEOUS | Status: DC
  Administered 2019-11-23 – 2019-11-25 (×3): 3 [IU] via SUBCUTANEOUS
  Administered 2019-11-26 – 2019-11-27 (×2): 2 [IU] via SUBCUTANEOUS
  Administered 2019-11-28: 4 [IU] via SUBCUTANEOUS

## 2019-11-23 MED ORDER — ENOXAPARIN SODIUM 40 MG/0.4ML ~~LOC~~ SOLN
40.0000 mg | SUBCUTANEOUS | Status: DC
  Administered 2019-11-23 – 2019-11-24 (×2): 40 mg via SUBCUTANEOUS
  Filled 2019-11-23 (×2): qty 0.4

## 2019-11-23 MED ORDER — HYDROCOD POLST-CPM POLST ER 10-8 MG/5ML PO SUER: 5.0000 mL | Freq: Two times a day (BID) | ORAL | Status: DC | PRN

## 2019-11-23 MED ORDER — DEXAMETHASONE SODIUM PHOSPHATE 10 MG/ML IJ SOLN
6.0000 mg | INTRAMUSCULAR | Status: DC
  Administered 2019-11-23 – 2019-11-24 (×2): 6 mg via INTRAVENOUS
  Filled 2019-11-23 (×2): qty 1

## 2019-11-23 MED ORDER — ASPIRIN EC 81 MG PO TBEC
81.0000 mg | DELAYED_RELEASE_TABLET | Freq: Every day | ORAL | Status: DC
  Administered 2019-11-23 – 2019-11-29 (×7): 81 mg via ORAL
  Filled 2019-11-23 (×7): qty 1

## 2019-11-23 MED ORDER — OXYCODONE HCL 5 MG PO TABS: 5.0000 mg | ORAL_TABLET | ORAL | Status: DC | PRN

## 2019-11-23 MED ORDER — GUAIFENESIN-DM 100-10 MG/5ML PO SYRP: 10.0000 mL | ORAL_SOLUTION | ORAL | Status: DC | PRN

## 2019-11-23 MED ORDER — BISACODYL 5 MG PO TBEC: 5.0000 mg | DELAYED_RELEASE_TABLET | Freq: Every day | ORAL | Status: DC | PRN

## 2019-11-23 MED ORDER — INSULIN ASPART 100 UNIT/ML ~~LOC~~ SOLN
0.0000 [IU] | Freq: Three times a day (TID) | SUBCUTANEOUS | Status: DC
  Administered 2019-11-23: 3 [IU] via SUBCUTANEOUS
  Administered 2019-11-23: 8 [IU] via SUBCUTANEOUS
  Administered 2019-11-24: 5 [IU] via SUBCUTANEOUS

## 2019-11-23 MED ORDER — ONDANSETRON HCL 4 MG/2ML IJ SOLN
4.0000 mg | Freq: Four times a day (QID) | INTRAMUSCULAR | Status: DC | PRN
  Filled 2019-11-23: qty 2

## 2019-11-23 MED ORDER — ATORVASTATIN CALCIUM 80 MG PO TABS
80.0000 mg | ORAL_TABLET | Freq: Every day | ORAL | Status: DC
  Administered 2019-11-23 – 2019-11-28 (×6): 80 mg via ORAL
  Filled 2019-11-23 (×6): qty 1

## 2019-11-23 NOTE — ED Provider Notes (Signed)
7:19 AM Care assumed from Dr. Bebe Shaggy.  At time of transfer care, patient is awaiting results of his coronavirus test prior to likely admission for new oxygen requirement with oxygen saturations in the 70s on room air.  Patient has had fevers, chills, cough, shortness of breath, nausea/vomiting/diarrhea, and now is on 3 L to maintain oxygen saturations.  He will need admission even if his Covid test is negative.  7:27 AM Coronavirus test just returned positive.  Will call for admission as previous team plans given the hypoxia down to the 70s on room air.   Clinical Impression: 1. Acute respiratory failure with hypoxia (HCC)   2. COVID-19     Disposition: Admit  This note was prepared with assistance of Dragon voice recognition software. Occasional wrong-word or sound-a-like substitutions may have occurred due to the inherent limitations of voice recognition software.      Prudie Guthridge, Canary Brim, MD 11/23/19 [phone]

## 2019-11-23 NOTE — ED Notes (Signed)
o2 increased from 3lpm to 4lpm, saturation increased to 92-94%

## 2019-11-23 NOTE — ED Notes (Signed)
Pt's SpO2 decreased to 80%, RN checked on pt, pt had removed O2 to eat lunch. RN reapplied O2 at 6L via [place].

## 2019-11-23 NOTE — ED Notes (Signed)
Pulse ox DESAT to 85-88 after disconnect to settle pt in room

## 2019-11-23 NOTE — ED Provider Notes (Signed)
[facility] EMERGENCY DEPARTMENT Provider Note   CSN: [id number] Arrival date & time: 11/23/19  3151     History Chief Complaint  Patient presents with  . Shortness of Breath  . Fever    Joshua Howell is a 59 y.o. male.  The history is provided by the patient.  Shortness of Breath Severity:  Moderate Onset quality:  Gradual Duration:  24 hours Timing:  Constant Progression:  Worsening Chronicity:  New Relieved by:  Oxygen Worsened by:  Nothing Associated symptoms: cough and fever   Associated symptoms: no hemoptysis   Fever Associated symptoms: cough   Patient with history of CAD, hyperlipidemia, diabetes presents with cough, fever and shortness of breath Patient reports for the past week he has not felt well.  He was at work and thought he was overheated and had vomiting.  He is also had episodes of mild diarrhea Over the past 24 hours has had increasing fatigue and cough.  He also reports shortness of breath.  He reports fever up to 101 He is active and still works at baseline.  He is not on home oxygen   EMS reports patient was hypoxic to the 70s on their arrival Past Medical History:  Diagnosis Date  . CAD (coronary artery disease)    a. NSTEMI 01/2017 s/p CABG.  . Carotid arterial disease (HCC)    a. mild 1-39% 01/2017.  Marland Kitchen Dyslipidemia   . Migraine    "only in [employer]" (01/23/2017)  . Morbid obesity (HCC)   . NSTEMI (non-ST elevated myocardial infarction) (HCC) 01/23/2017  . Type I diabetes mellitus (HCC) 07/2016    Patient Active Problem List   Diagnosis Date Noted  . Noncompliance 09/23/2019  . Allergic rhinitis due to pollen 09/23/2019  . Trigger ring finger of left hand 09/23/2019  . Decreased pulse 09/23/2019  . Stenosis of carotid artery 09/23/2019  . Vaccine refused by patient 04/14/2018  . Encounter for screening for vascular disease 04/14/2018  . Erectile dysfunction 10/23/2017  . 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  Family history of premature CAD 01/23/2017  . NSTEMI (non-ST elevated myocardial infarction) (HCC) 01/23/2017  . Mixed dyslipidemia 06/26/2016  . Uncontrolled type 2 diabetes mellitus with complication, without long-term current use of insulin (HCC) 09/03/2014  . Morbid obesity (HCC) 09/03/2014    Past Surgical History:  Procedure Laterality Date  . ANTERIOR CERVICAL DECOMP/DISCECTOMY FUSION     "took a piece off my right hip hip & put it in there"  . BACK SURGERY    . CORONARY ARTERY BYPASS GRAFT N/A 01/28/2017   Procedure: CORONARY ARTERY BYPASS GRAFTING (CABG ) x 4 USING LEFT INTERNAL MAMMARY ARTERY AND ENDOSCOPIC HARVESTING OF RIGHT SAPHENOUS VEIN,  LIMA-LAD SVG-RAMUS SEQ SVG-PD-PL;  Surgeon: [name], MD;  Location: [facility] OR;  Service: Open Heart Surgery;  Laterality: N/A;  . LEFT HEART CATH AND CORONARY ANGIOGRAPHY N/A 01/24/2017   Procedure: LEFT HEART CATH AND CORONARY ANGIOGRAPHY;  Surgeon: Corky Crafts, MD;  [facility] INVAS[facility] CV LAB;  Service: Cardiovascular;  Laterality: N/A;  . TEE WITHOUT CARDIOVERSION N/A 01/28/2017   Procedure: TRANSESOPHAGEAL ECHOCARDIOGRAM (TEE);  Surgeon: [name], MD;  Location: [facility] OR;  Service: Open Heart Surgery;  Laterality: N/A;       Family History  Problem Relation Age of Onset  . Gallbladder disease Mother   . Aneurysm Mother        brain  . Stroke Mother   .  Heart disease Father 55       3 vessel ABG  . Heart disease Brother 45       MI  . Other Brother        died of hemorrhage in head after fall    Social History   Tobacco Use  . Smoking status: Former Smoker    Types: Cigarettes  . Smokeless tobacco: Never Used  . Tobacco comment: 01/23/2017 "quit smoking in the 1990s"  Vaping Use  . Vaping Use: Never used  Substance Use Topics  . Alcohol use: Yes    Alcohol/week: 0.0 standard drinks    Comment: 01/23/2017 "maybe 4 beers/month"  . Drug use: No    Home Medications Prior to Admission  medications   Medication Sig Start Date End Date Taking? Authorizing Provider  aspirin EC 81 MG tablet Take 1 tablet (81 mg total) by mouth daily. 09/24/19   Tysinger, Kermit Balo, PA-C  atorvastatin (LIPITOR) 80 MG tablet TAKE 1 TABLET BY MOUTH  DAILY AT 6 PM 03/20/19   Tysinger, Kermit Balo, PA-C  metFORMIN (GLUCOPHAGE) 1000 MG tablet Take 1 tablet (1,000 mg total) by mouth 2 (two) times daily with a meal. 09/24/19   Tysinger, Kermit Balo, PA-C  methylPREDNISolone (MEDROL DOSEPAK) 4 MG TBPK tablet Use as directed 10/06/19   Tarry Kos, MD    Allergies    Patient has no known allergies.  Review of Systems   Review of Systems  Constitutional: Positive for fatigue and fever.  Respiratory: Positive for cough and shortness of breath. Negative for hemoptysis.   All other systems reviewed and are negative.   Physical Exam Updated Vital Signs BP 129/66   Pulse 81   Temp 99.9 F (37.7 C) (Oral)   Resp (!) 24   Ht 1.829 m (6')   Wt 124.7 kg   SpO2 91%   BMI 37.30 kg/m   Physical Exam CONSTITUTIONAL: Well developed/well nourished HEAD: Normocephalic/atraumatic EYES: EOMI/PERRL ENMT: Mucous membranes moist NECK: supple no meningeal signs SPINE/BACK:entire spine nontender CV: S1/S2 noted, no murmurs/rubs/gallops noted LUNGS: Mild tachypnea, crackles bilaterally, patient on 3 L nasal cannula ABDOMEN: soft, nontender, no rebound or guarding, bowel sounds noted throughout abdomen GU:no cva tenderness NEURO: Pt is awake/alert/appropriate, moves all extremitiesx4.  No facial droop.   EXTREMITIES: pulses normal/equal, full ROM SKIN: warm, color normal PSYCH: no abnormalities of mood noted, alert and oriented to situation  ED Results / Procedures / Treatments   Labs (all labs ordered are listed, but only abnormal results are displayed) Labs Reviewed  BASIC METABOLIC PANEL - Abnormal; Notable for the following components:      Result Value   Sodium 133 (*)    Chloride 97 (*)    Glucose, Bld  226 (*)    Calcium 8.5 (*)    All other components within normal limits  HEPATIC FUNCTION PANEL - Abnormal; Notable for the following components:   Albumin 2.8 (*)    AST 93 (*)    ALT 68 (*)    Total Bilirubin 1.5 (*)    Bilirubin, Direct 0.3 (*)    Indirect Bilirubin 1.2 (*)    All other components within normal limits  SARS CORONAVIRUS 2 BY RT PCR [facility] ORDER, PERFORMED IN [facility] HOSPITAL LAB)  CBC    EKG EKG Interpretation  Date/Time:  Monday November 23 2019 03:21:23 EDT Ventricular Rate:  92 PR Interval:  128 QRS Duration: 98 QT Interval:  358 QTC Calculation: 442 R Axis:  62 Text Interpretation: Sinus rhythm with Premature atrial complexes Low voltage QRS Nonspecific ST abnormality Abnormal ECG Confirmed by Zadie Rhine ([id number]) on 11/23/2019 5:52:58 AM   Radiology DG Chest 2 View  Result Date: 11/23/2019 CLINICAL DATA:  59 year old male with shortness of breath EXAM: CHEST - 2 VIEW COMPARISON:  Chest radiograph dated 03/04/2017. FINDINGS: Bibasilar and bilateral peripheral and subpleural streaky densities may represent atelectatic changes or atypical infection. Clinical correlation is recommended. No lobar consolidation, pleural effusion, or pneumothorax. The cardiac silhouette is within limits. Median sternotomy wires and CABG vascular clips. No acute osseous pathology. IMPRESSION: Bibasilar and subpleural streaky densities may represent atelectatic changes or atypical infection. Electronically Signed   By: Elgie Collard M.D.   On: 11/23/2019 03:36    Procedures Procedures   Medications Ordered in ED Medications - No data to display  ED Course  I have reviewed the triage vital signs and the nursing notes.  Pertinent labs & imaging results that were available during my care of the patient were reviewed by me and considered in my medical decision making (see chart for details).    MDM Rules/Calculators/A&P                         6:30 AM Patient  presents with cough and fever worsening over the past 24 hours.  He has not been vaccinated for COVID-19 Patient is now having an oxygen requirement and infiltrates on x-ray.  Strong suspicion for Covid pneumonia.  Labs are pending at this time 7:02 AM Signed out to Dr. Rush Landmark at shift change to f/u on COVID test and admit   This patient presents to the ED for concern of shortness of breath, this involves an extensive number of treatment options, and is a complaint that carries with it a high risk of complications and morbidity.  The differential diagnosis includes pneumonia, CHF, COVID-19   Lab Tests:   I Ordered, reviewed, and interpreted labs, which included CBC, bmp, COVID test  Imaging Studies ordered:   I ordered imaging studies which included chest xray   I independently visualized and interpreted imaging which showed infiltrates/pneumonia  Additional history obtained:    Previous records obtained and reviewed      ABEM SHADDIX was evaluated in Emergency Department on 11/23/2019 for the symptoms described in the history of present illness. He was evaluated in the context of the global COVID-19 pandemic, which necessitated consideration that the patient might be at risk for infection with the SARS-CoV-2 virus that causes COVID-19. Institutional protocols and algorithms that pertain to the evaluation of patients at risk for COVID-19 are in a state of rapid change based on information released by regulatory bodies including the CDC and federal and state organizations. These policies and algorithms were followed during the patient's care in the ED.  Final Clinical Impression(s) / ED Diagnoses Final diagnoses:  Acute respiratory failure with hypoxia [facility])    Rx / DC Orders ED Discharge Orders    None       Zadie Rhine, MD 11/23/19 [phone]

## 2019-11-23 NOTE — Progress Notes (Signed)
[name] [id number] Admission Data: 11/23/2019 3:59 PM Attending Provider: Jonah Blue, MD  [name], Kermit Balo, PA-C Consults/ Treatment Team:   [name] is a 59 y.o. male patient admitted from ED awake, alert  & orientated  X 3,  Full Code, VSS - Blood pressure 91/73, pulse 72, temperature 98.3 F (36.8 C), temperature source Oral, resp. rate 20, height 6' (1.829 m), weight 124.7 kg, SpO2 95 %., O2   5L nasal cannular, no c/o shortness of breath, no c/o chest pain, no distress noted. Tele # 19 placed and pt is currently running:normal sinus rhythm.   IV site WDL:  forearm left, condition patent and no redness with a transparent dsg that's clean dry and intact.   Pt orientation to unit, room and routine. Information packet given to patient/family and safety video watched.  Admission INP armband ID verified with patient/family, and in place. SR up x 2, fall risk assessment complete with Patient and family verbalizing understanding of risks associated with falls. Pt verbalizes an understanding of how to use the call bell and to call for help before getting out of bed.  Skin, clean-dry- intact without evidence of bruising, or skin tears.   No evidence of skin break down noted on exam.     Will cont to monitor and assist as needed.  Phineas Douglas, RN 11/23/2019 3:59 PM

## 2019-11-23 NOTE — ED Notes (Signed)
Pt provided water.  

## 2019-11-23 NOTE — H&P (Addendum)
History and Physical    Joshua Howell ZSW:[id number] DOB: July 01, 1960 DOA: 11/23/2019  PCP: Jac Canavan, PA-C Consultants:  Roda Shutters - orthopedics; Hendrickson-  CT surgery; Delton See - cardiology Patient coming from:  Home - lives alone; NOK: Daughter, Joshua Howell, [phone]  Chief Complaint: Fever, SOB  HPI: Joshua Howell is a 59 y.o. male with medical history significant of T1DM; CAD s/p CABG; obesity (BMI 37); and HLD presenting with fever, SOB.  He reports that 1-2 weeks ago he had an episode of n/v and began feeling very tired.  He started staying home in self-quarantine.  Mild low-grade fevers.  + cough.  He started having SOB over the last 1-2 days and decided to come to the ER.   ED Course:  Sats on 70s, now on 3L.  COVID +.     Review of Systems: As per HPI; otherwise review of systems reviewed and negative.   Ambulatory Status:  Ambulates without assistance  COVID Vaccine Status:  None  Past Medical History:  Diagnosis Date  . CAD (coronary artery disease)    a. NSTEMI 01/2017 s/p CABG.  . Carotid arterial disease (HCC)    a. mild 1-39% 01/2017.  Marland Kitchen Dyslipidemia   . Migraine    "only in [employer]" (01/23/2017)  . Morbid obesity (HCC)   . NSTEMI (non-ST elevated myocardial infarction) (HCC) 01/23/2017  . Type I diabetes mellitus (HCC) 07/2016    Past Surgical History:  Procedure Laterality Date  . ANTERIOR CERVICAL DECOMP/DISCECTOMY FUSION     "took a piece off my right hip hip & put it in there"  . BACK SURGERY    . CORONARY ARTERY BYPASS GRAFT N/A 01/28/2017   Procedure: CORONARY ARTERY BYPASS GRAFTING (CABG ) x 4 USING LEFT INTERNAL MAMMARY ARTERY AND ENDOSCOPIC HARVESTING OF RIGHT SAPHENOUS VEIN,  LIMA-LAD SVG-RAMUS SEQ SVG-PD-PL;  Surgeon: [name], MD;  Location: [facility] OR;  Service: Open Heart Surgery;  Laterality: N/A;  . LEFT HEART CATH AND CORONARY ANGIOGRAPHY N/A 01/24/2017   Procedure: LEFT HEART CATH AND CORONARY ANGIOGRAPHY;   Surgeon: Corky Crafts, MD;  Location: [facility] INVASIVE CV LAB;  Service: Cardiovascular;  Laterality: N/A;  . TEE WITHOUT CARDIOVERSION N/A 01/28/2017   Procedure: TRANSESOPHAGEAL ECHOCARDIOGRAM (TEE);  Surgeon: [name], MD;  Location: [facility] OR;  Service: Open Heart Surgery;  Laterality: N/A;    Social History   Socioeconomic History  . Marital status: Widowed    Spouse name: Not on file  . Number of children: Not on file  . Years of education: Not on file  . Highest education level: Not on file  Occupational History  . Not on file  Tobacco Use  . Smoking status: Former Smoker    Types: Cigarettes  . Smokeless tobacco: Never Used  . Tobacco comment: 01/23/2017 "quit smoking in the 1990s"  Vaping Use  . Vaping Use: Never used  Substance and Sexual Activity  . Alcohol use: Yes    Alcohol/week: 0.0 standard drinks    Comment: 01/23/2017 "maybe 4 beers/month"  . Drug use: No  . Sexual activity: Not Currently  Other Topics Concern  . Not on file  Social History Narrative   Lives alone.   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 Difficulty of Paying Living Expenses:   Food Insecurity:   . Worried About Programme researcher, broadcasting/film/video in the Last Year:   . Ran  Out of Food in the Last Year:   Transportation Needs:   . Lack of Transportation (Medical):   Marland Kitchen Lack of Transportation (Non-Medical):   Physical Activity:   . Days of Exercise per Week:   . Minutes of Exercise per Session:   Stress:   . Feeling of Stress :   Social Connections:   . Frequency of Communication with Friends and Family:   . Frequency of Social Gatherings with Friends and Family:   . Attends Religious Services:   . Active Member of Clubs or Organizations:   . Attends Banker Meetings:   Marland Kitchen Marital Status:   Intimate Partner Violence:   . Fear of Current or Ex-Partner:   . Emotionally Abused:   Marland Kitchen Physically Abused:   . Sexually  Abused:     No Known Allergies  Family History  Problem Relation Age of Onset  . Gallbladder disease Mother   . Aneurysm Mother        brain  . Stroke Mother   . Heart disease Father 55       3 vessel ABG  . Heart disease Brother 45       MI  . Other Brother        died of hemorrhage in head after fall    Prior to Admission medications   Medication Sig Start Date End Date Taking? Authorizing Provider  aspirin EC 81 MG tablet Take 1 tablet (81 mg total) by mouth daily. 09/24/19   Tysinger, Kermit Balo, PA-C  atorvastatin (LIPITOR) 80 MG tablet TAKE 1 TABLET BY MOUTH  DAILY AT 6 PM 03/20/19   Tysinger, Kermit Balo, PA-C  metFORMIN (GLUCOPHAGE) 1000 MG tablet Take 1 tablet (1,000 mg total) by mouth 2 (two) times daily with a meal. 09/24/19   Tysinger, Kermit Balo, PA-C  methylPREDNISolone (MEDROL DOSEPAK) 4 MG TBPK tablet Use as directed 10/06/19   Tarry Kos, MD    Physical Exam: Vitals:   11/23/19 1130 11/23/19 1145 11/23/19 1200 11/23/19 1245  BP: 114/63 (!) 109/56 119/71 (!) 103/57  Pulse: 76 73 77 76  Resp: 11 17 (!) 30 (!) 26  Temp:      TempSrc:      SpO2: (!) 88% 91% 93% 93%  Weight:      Height:         . General:  Appears calm and comfortable and is NAD; wearing a distinctive hat about 2nd amendment rights with a gun embroidered on it.  Mildly to moderately SOB despite 5L [place] O2, with sats 88-90%. . Eyes:  PERRL, EOMI, normal lids, iris . ENT:  grossly normal hearing, lips & tongue, mmm; suboptimal dentition; diffuse oropharyngeal thrush . Neck:  no LAD, masses or thyromegaly . Cardiovascular:  RRR, no m/r/g. No LE edema.  Marland Kitchen Respiratory:   Scattered diffuse rhonchi.  Normal respiratory effort. . Abdomen:  soft, NT, ND, NABS . Skin:  no rash or induration seen on limited exam . Musculoskeletal:  grossly normal tone BUE/BLE, good ROM, no bony abnormality . Psychiatric:  grossly normal mood and affect, speech fluent and appropriate, AOx3 . Neurologic:  CN 2-12 grossly intact,  moves all extremities in coordinated fashion    Radiological Exams on Admission: DG Chest 2 View  Result Date: 11/23/2019 CLINICAL DATA:  59 year old male with shortness of breath EXAM: CHEST - 2 VIEW COMPARISON:  Chest radiograph dated 03/04/2017. FINDINGS: Bibasilar and bilateral peripheral and subpleural streaky densities may represent atelectatic changes or atypical infection.  Clinical correlation is recommended. No lobar consolidation, pleural effusion, or pneumothorax. The cardiac silhouette is within limits. Median sternotomy wires and CABG vascular clips. No acute osseous pathology. IMPRESSION: Bibasilar and subpleural streaky densities may represent atelectatic changes or atypical infection. Electronically Signed   By: Elgie CollardArash  Radparvar M.D.   On: 11/23/2019 03:36    EKG: Independently reviewed.  NSR with rate 80; nonspecific ST changes with no evidence of acute ischemia   Labs on Admission: I have personally reviewed the available labs and imaging studies at the time of the admission.  Pertinent labs:   Glucose 226 Albumin 2.8 AST 93/ALT 68/Bili 1.5 Normal CBC COVID POSITIVE LDH 366 Ferritin 1425 CRP 23.5 Procalcitonin 0.11 D-dimer 1.82 Fibrinogen >800 HIV negative   Assessment/Plan Principal Problem:   Acute hypoxemic respiratory failure due to COVID-19 [facility](HCC) Active Problems:   Uncontrolled type 2 diabetes mellitus with complication, without long-term current use of insulin (HCC)   Obesity, Class II, BMI 35-39.9   Mixed dyslipidemia   S/P CABG x 4   Acute respiratory failure with hypoxia -Patient with presenting with SOB and reported fever at home; also with cough  -Hypoxia as low at 70s upon presentation, does not have a usual home O2 requirement and is currently requiring 5L [place] O2 -COVID POSITIVE -The patient has comorbidities which may increase the risk for ARDS/MODS including: DM, obesity -Exam is concerning for development of ARDS/MODS due to respiratory  distress -Pertinent labs concerning for COVID include normal WBC count; increased BUN/Creatinine; increased LFTs; increased LDH; markedly elevated D-dimer (>1); markedly increased ferritin; low procalcitonin; markedly elevated CRP (>>7); markedly increased fibrinogen -CXR with multifocal opacities which may be c/w COVID  -Will not treat with broad-spectrum antibiotics given procalcitonin <0.1 -Will admit for further evaluation, close monitoring, and treatment -Monitor on telemetry x at least 24 hours -At this time, will attempt to avoid use of aerosolized medications and use HFAs instead -Will check daily labs including BMP with Mag, Phos; LFTs; CBC with differential; CRP; ferritin; fibrinogen; D-dimer -Will order steroids and Remdesivir (pharmacy consult) given +COVID test, +CXR, and hypoxia <94% on room air -If the patient shows clinical deterioration, consider transfer to ICU with PCCM consultation -Consider Tocilizumab and/or convalescent plasma if the patient does not stabilize on current treatment or if the patient has marked clinical decompensation; the patient does not appear to require these treatments at this time. -Will attempt to maintain euvolemia to a net negative fluid status -Will ask the patient to maintain an awake prone position for 16+ hours a day, if possible, with a minimum of 2-3 hours at a time -With D-dimer <5, will use standard-dosed Lovenox for DVT prevention -Patient was seen wearing full PPE including: gown, gloves, head cover, N95, and face shield; donning and doffing was in compliance with current standards.  DM -Most recent A1c was 7.6 on 5/12, indicating poor control -hold Glucophage -Cover with moderate-scale SSI  Thrush -Likely related to uncontrolled DM -Treat with nystatin   HLD -Continue Lipitor  CAD -Continue ASA  Obesity -Body mass index is 37.3 kg/m. -Weight loss should be encouraged -Outpatient PCP/bariatric medicine f/u  encouraged    DVT prophylaxis:  Lovenox  Code Status:  Full - confirmed with patient Family Communication: None present; he declined to have me call his son or daughter at the time of admission Disposition Plan:  The patient is from: home  [facility] once [facility] issues have been resolved.  He may  require home O2 at the time of discharge.  Anticipated d/c date will depend on clinical response to treatment, likely 5+ days  Patient is currently: acutely ill Consults called: None  Admission status: Admit - It is my clinical opinion that admission to INPATIENT is reasonable and necessary because of the expectation that this patient will require hospital care that crosses at least 2 midnights to treat this condition based on the medical complexity of the problems presented.  Given the aforementioned information, the predictability of an adverse outcome is felt to be significant.      Jonah Blue MD [facility]   How to contact the [facility] Attending or Consulting provider 7A - 7P or covering provider during after hours 7P -7A, for this patient?  1. Check the care team in [facility] and look for a) attending/consulting [facility] provider listed and b) the [facility] team listed 2. Log into [email or web address] and use [place]'s universal password to access. If you do not have the password, please contact the hospital operator. 3. Locate the [facility] provider you are looking for under [facility] and page to a number that you can be directly reached. 4. If you still have difficulty reaching the provider, please page the [facility] (Director on Call) for the Hospitalists listed on amion for assistance.   11/23/2019, 1:16 PM

## 2019-11-23 NOTE — ED Triage Notes (Signed)
Pt presents to ED BIB [facility]. Pt c/o SOB, fever, and fatigue x1w. Per EMS upon arrival pt's O2 in 70's. Pt reports temp got up to 101 at home. Pt dyspniec at rest. Placed on 3L by EMS and maintains O2 sat >90%.

## 2019-11-24 DIAGNOSIS — E1165 Type 2 diabetes mellitus with hyperglycemia: Secondary | ICD-10-CM

## 2019-11-24 DIAGNOSIS — E782 Mixed hyperlipidemia: Secondary | ICD-10-CM

## 2019-11-24 DIAGNOSIS — E118 Type 2 diabetes mellitus with unspecified complications: Secondary | ICD-10-CM

## 2019-11-24 LAB — CBC WITH DIFFERENTIAL/PLATELET
Abs Immature Granulocytes: 0.04 10*3/uL (ref 0.00–0.07)
Basophils Absolute: 0 10*3/uL (ref 0.0–0.1)
Basophils Relative: 0 %
Eosinophils Absolute: 0 10*3/uL (ref 0.0–0.5)
Eosinophils Relative: 0 %
HCT: 41.1 % (ref 39.0–52.0)
Hemoglobin: 13.8 g/dL (ref 13.0–17.0)
Immature Granulocytes: 1 %
Lymphocytes Relative: 7 %
Lymphs Abs: 0.5 10*3/uL — ABNORMAL LOW (ref 0.7–4.0)
MCH: 31.9 pg (ref 26.0–34.0)
MCHC: 33.6 g/dL (ref 30.0–36.0)
MCV: 94.9 fL (ref 80.0–100.0)
Monocytes Absolute: 0.2 10*3/uL (ref 0.1–1.0)
Monocytes Relative: 4 %
Neutro Abs: 5.8 10*3/uL (ref 1.7–7.7)
Neutrophils Relative %: 88 %
Platelets: 269 10*3/uL (ref 150–400)
RBC: 4.33 MIL/uL (ref 4.22–5.81)
RDW: 12.7 % (ref 11.5–15.5)
WBC: 6.6 10*3/uL (ref 4.0–10.5)
nRBC: 0 % (ref 0.0–0.2)

## 2019-11-24 LAB — COMPREHENSIVE METABOLIC PANEL
ALT: 104 U/L — ABNORMAL HIGH (ref 0–44)
AST: 129 U/L — ABNORMAL HIGH (ref 15–41)
Albumin: 2.3 g/dL — ABNORMAL LOW (ref 3.5–5.0)
Alkaline Phosphatase: 79 U/L (ref 38–126)
Anion gap: 11 (ref 5–15)
BUN: 18 mg/dL (ref 8–23)
CO2: 22 mmol/L (ref 22–32)
Calcium: 8.3 mg/dL — ABNORMAL LOW (ref 8.9–10.3)
Chloride: 99 mmol/L (ref 98–111)
Creatinine, Ser: 0.92 mg/dL (ref 0.61–1.24)
GFR calc Af Amer: >60 mL/min (ref >60)
GFR calc non Af Amer: >60 mL/min (ref >60)
Glucose, Bld: 250 mg/dL — ABNORMAL HIGH (ref 70–99)
Potassium: 4.3 mmol/L (ref 3.5–5.1)
Sodium: 132 mmol/L — ABNORMAL LOW (ref 135–145)
Total Bilirubin: 1.4 mg/dL — ABNORMAL HIGH (ref 0.3–1.2)
Total Protein: 6.7 g/dL (ref 6.5–8.1)

## 2019-11-24 LAB — C-REACTIVE PROTEIN: CRP: 22.8 mg/dL — ABNORMAL HIGH (ref <1.0)

## 2019-11-24 LAB — FERRITIN: Ferritin: 1796 ng/mL — ABNORMAL HIGH (ref 24–336)

## 2019-11-24 LAB — D-DIMER, QUANTITATIVE: D-Dimer, Quant: 1.88 ug/mL-FEU — ABNORMAL HIGH (ref 0.00–0.50)

## 2019-11-24 LAB — GLUCOSE, CAPILLARY
Glucose-Capillary: 231 mg/dL — ABNORMAL HIGH (ref 70–99)
Glucose-Capillary: 267 mg/dL — ABNORMAL HIGH (ref 70–99)
Glucose-Capillary: 332 mg/dL — ABNORMAL HIGH (ref 70–99)
Glucose-Capillary: 333 mg/dL — ABNORMAL HIGH (ref 70–99)
Glucose-Capillary: 260 mg/dL — ABNORMAL HIGH (ref 70–99)

## 2019-11-24 LAB — MAGNESIUM: Magnesium: 2.1 mg/dL (ref 1.7–2.4)

## 2019-11-24 LAB — PHOSPHORUS: Phosphorus: 3.2 mg/dL (ref 2.5–4.6)

## 2019-11-24 MED ORDER — INSULIN GLARGINE 100 UNIT/ML ~~LOC~~ SOLN
15.0000 [IU] | Freq: Every day | SUBCUTANEOUS | Status: DC
  Administered 2019-11-24 – 2019-11-29 (×6): 15 [IU] via SUBCUTANEOUS
  Filled 2019-11-24 (×6): qty 0.15

## 2019-11-24 MED ORDER — INSULIN ASPART 100 UNIT/ML ~~LOC~~ SOLN
4.0000 [IU] | Freq: Three times a day (TID) | SUBCUTANEOUS | Status: DC
  Administered 2019-11-24 – 2019-11-25 (×4): 4 [IU] via SUBCUTANEOUS

## 2019-11-24 MED ORDER — INSULIN ASPART 100 UNIT/ML ~~LOC~~ SOLN
0.0000 [IU] | Freq: Three times a day (TID) | SUBCUTANEOUS | Status: DC
  Administered 2019-11-24: 11 [IU] via SUBCUTANEOUS
  Administered 2019-11-24: 15 [IU] via SUBCUTANEOUS
  Administered 2019-11-25: 7 [IU] via SUBCUTANEOUS
  Administered 2019-11-25 (×2): 11 [IU] via SUBCUTANEOUS
  Administered 2019-11-26 – 2019-11-27 (×5): 7 [IU] via SUBCUTANEOUS
  Administered 2019-11-27: 4 [IU] via SUBCUTANEOUS
  Administered 2019-11-28: 5 [IU] via SUBCUTANEOUS
  Administered 2019-11-28: 7 [IU] via SUBCUTANEOUS
  Administered 2019-11-28: 20 [IU] via SUBCUTANEOUS
  Administered 2019-11-29: 3 [IU] via SUBCUTANEOUS

## 2019-11-24 MED ORDER — METHYLPREDNISOLONE SODIUM SUCC 125 MG IJ SOLR
60.0000 mg | Freq: Two times a day (BID) | INTRAMUSCULAR | Status: DC
  Administered 2019-11-24 – 2019-11-28 (×9): 60 mg via INTRAVENOUS
  Filled 2019-11-24 (×9): qty 2

## 2019-11-24 MED ORDER — ENOXAPARIN SODIUM 60 MG/0.6ML ~~LOC~~ SOLN
60.0000 mg | SUBCUTANEOUS | Status: DC
  Administered 2019-11-25 – 2019-11-27 (×3): 60 mg via SUBCUTANEOUS
  Filled 2019-11-24 (×4): qty 0.6

## 2019-11-24 NOTE — Progress Notes (Addendum)
PROGRESS NOTE                                                                                                                                                                                                             Patient Demographics:    Joshua Howell, is a 59 y.o. male, DOB - 12-14-1960, SJG:[id number]  Outpatient Primary MD for the patient is Genia Del   Admit date - 11/23/2019   LOS - 1  Chief Complaint  Patient presents with  . Shortness of Breath  . Fever       Brief Narrative: Patient is a 59 y.o. male with PMHx of DM-2, CAD s/p CABG, morbid obesity, HLD-who has had URI/cough symptoms for the past 2 weeks-presenting with worsening shortness of breath for the past few days-found to have acute hypoxic failure in the setting of COVID-19 pneumonia.  See below for further details.  Significant Events: 7/12>> admit to [facility] for hypoxia from COVID-19 pneumonia  COVID-19 medications: Steroids: 7/12>> Remdesivir: 7/12>>  Significant studies: 7/12>> chest x-ray: Bibasilar/subpleural streaky densities  Antibiotics: None  Microbiology data: None  Procedures: None  Consults: None  DVT prophylaxis: enoxaparin (LOVENOX) injection 40 mg Start: 11/23/19 1000    Subjective:    Joshua Howell today feels better-he was titrated to 1 L of oxygen this morning.  He is not interested in Covid vaccine.   Assessment  & Plan :   Acute Hypoxic Resp Failure due to Covid 19 Viral pneumonia: Improved-titrated down to 1 L of oxygen this morning.  Continue steroids/remdesivir.  Follow CRP/inflammatory markers-attempt to titrate off oxygen.  If he continues to improve-suspect he could complete remdesivir infusion in the outpatient infusion center in [place].  Fever: afebrile O2 requirements:  SpO2: 93 % O2 Flow Rate (L/min): 4 L/min   COVID-19 Labs: Recent Labs    11/23/19 1105 11/24/19 0248    DDIMER 1.82* 1.88*  FERRITIN 1,425* 1,796*  LDH 366*  --   CRP 23.5* 22.8*    No results found for: BNP  Recent Labs  Lab 11/23/19 1105  PROCALCITON 0.11    Lab Results  Component Value Date   SARSCOV2NAA POSITIVE (A) 11/23/2019     Prone/Incentive Spirometry: encouraged  incentive spirometry use 3-4/hour.   Transaminitis: Secondary to COVID-19-mild-watch closely-should improve with supportive care.  DM-2 (A1C  7.6 on 09/23/2019) with uncontrolled hyperglycemia secondary to steroids: CBGs remain uncontrolled-add Lantus 15 units daily, 4 units of NovoLog with meals-change SSI to resistant scale.  Follow and adjust.   Recent Labs    11/23/19 1546 11/23/19 2124 11/24/19 0755  GLUCAP 261* 260* 231*   HLD: Continue statin  CAD s/p CABG: No anginal symptoms-continue aspirin  Morbid Obesity: Estimated body mass index is 37.3 kg/m as calculated from the following:   Height as of this encounter: 6' (1.829 m).   Weight as of this encounter: 124.7 kg.     ABG:    Component Value Date/Time   PHART 7.333 (L) 01/28/2017 1931   PCO2ART 40.4 01/28/2017 1931   PO2ART 76.0 (L) 01/28/2017 1931   HCO3 21.6 01/28/2017 1931   TCO2 24 01/29/2017 1737   ACIDBASEDEF 4.0 (H) 01/28/2017 1931   O2SAT 95.0 01/28/2017 1931    Vent Settings: N/A  Condition - Stable  Family Communication  : None at bedside-patient prefers to update family himself.  Code Status :  Full Code  Diet :  Diet Order            Diet heart healthy/carb modified Room service appropriate? Yes; Fluid consistency: Thin  Diet effective now                  Disposition Plan  :   Status is: Inpatient  Remains inpatient appropriate because:Inpatient level of care appropriate due to severity of illness   Dispo: The patient is from: Home              Anticipated d/c is to: Home              Anticipated d/c date is: 2 days              Patient currently is not medically stable to d/c.    Barriers  to discharge: Hypoxia requiring O2 supplementation/complete 5 days of IV Remdesivir  Antimicorbials  :    Anti-infectives (From admission, onward)   Start     Dose/Rate Route Frequency Ordered Stop   11/24/19 1000  remdesivir 100 mg in sodium chloride 0.9 % 100 mL IVPB     Discontinue    "Followed by" Linked Group Details   100 mg 200 mL/hr over 30 Minutes Intravenous Daily 11/23/19 0920 11/28/19 0959   11/23/19 1000  remdesivir 200 mg in sodium chloride 0.9% 250 mL IVPB       "Followed by" Linked Group Details   200 mg 580 mL/hr over 30 Minutes Intravenous Once 11/23/19 0920 11/23/19 1126      Inpatient Medications  Scheduled Meds: . aspirin EC  81 mg Oral Daily  . atorvastatin  80 mg Oral q1800  . enoxaparin (LOVENOX) injection  40 mg Subcutaneous Q24H  . insulin aspart  0-20 Units Subcutaneous TID WC  . insulin aspart  0-5 Units Subcutaneous QHS  . insulin aspart  4 Units Subcutaneous TID WC  . insulin glargine  15 Units Subcutaneous Daily  . methylPREDNISolone (SOLU-MEDROL) injection  60 mg Intravenous Q12H  . nystatin  5 mL Oral QID  . sodium chloride flush  3 mL Intravenous Q12H   Continuous Infusions: . sodium chloride    . remdesivir 100 mg in NS 100 mL 100 mg (11/24/19 0916)   PRN Meds:.sodium chloride, acetaminophen, albuterol, bisacodyl, chlorpheniramine-HYDROcodone, guaiFENesin-dextromethorphan, ondansetron **OR** ondansetron (ZOFRAN) IV, oxyCODONE, polyethylene glycol, sodium chloride flush, sodium phosphate   Time Spent in minutes  25  See  all Orders from today for further details   Jeoffrey Massed M.D on 11/24/2019 at 10:42 AM  To page go to [email or web address] - use universal password  [facility] -  Office  [phone]    Objective:   Vitals:   11/23/19 1926 11/23/19 2328 11/24/19 0417 11/24/19 0757  BP: 111/66 118/69 113/67 104/65  Pulse: 78 74 67 66  Resp: 17 (!) 24 (!) 24 20  Temp: 98.1 F (36.7 C) 98.7 F (37.1 C) 98.5 F (36.9 C) 98.1  F (36.7 C)  TempSrc: Oral Oral Oral Oral  SpO2: 94% 93% 99% 93%  Weight:      Height:        Wt Readings from Last 3 Encounters:  11/23/19 124.7 kg  09/23/19 131.5 kg  10/01/18 127 kg     Intake/Output Summary (Last 24 hours) at 11/24/2019 1042 Last data filed at 11/24/2019 0902 Gross per 24 hour  Intake 490 ml  Output --  Net 490 ml     Physical Exam Gen Exam:Alert awake-not in any distress HEENT:atraumatic, normocephalic Chest: B/L clear to auscultation anteriorly CVS:S1S2 regular Abdomen:soft non tender, non distended Extremities:no edema Neurology: Non focal Skin: no rash   Data Review:    CBC Recent Labs  Lab 11/23/19 0320 11/24/19 0248  WBC 9.8 6.6  HGB 14.9 13.8  HCT 44.0 41.1  PLT 281 269  MCV 94.2 94.9  MCH 31.9 31.9  MCHC 33.9 33.6  RDW 12.7 12.7  LYMPHSABS  --  0.5*  MONOABS  --  0.2  EOSABS  --  0.0  BASOSABS  --  0.0    Chemistries  Recent Labs  Lab 11/23/19 0320 11/24/19 0248  NA 133* 132*  K 4.7 4.3  CL 97* 99  CO2 23 22  GLUCOSE 226* 250*  BUN 13 18  CREATININE 1.12 0.92  CALCIUM 8.5* 8.3*  MG  --  2.1  AST 93* 129*  ALT 68* 104*  ALKPHOS 81 79  BILITOT 1.5* 1.4*   ------------------------------------------------------------------------------------------------------------------ No results for input(s): CHOL, HDL, LDLCALC, TRIG, CHOLHDL, LDLDIRECT in the last 72 hours.  Lab Results  Component Value Date   HGBA1C 7.6 (H) 09/23/2019   ------------------------------------------------------------------------------------------------------------------ No results for input(s): TSH, T4TOTAL, T3FREE, THYROIDAB in the last 72 hours.  Invalid input(s): FREET3 ------------------------------------------------------------------------------------------------------------------ Recent Labs    11/23/19 1105 11/24/19 0248  FERRITIN 1,425* 1,796*    Coagulation profile No results for input(s): INR, PROTIME in the last 168  hours.  Recent Labs    11/23/19 1105 11/24/19 0248  DDIMER 1.82* 1.88*    Cardiac Enzymes No results for input(s): CKMB, TROPONINI, MYOGLOBIN in the last 168 hours.  Invalid input(s): CK ------------------------------------------------------------------------------------------------------------------ No results found for: BNP  Micro Results Recent Results (from the past 240 hour(s))  SARS Coronavirus 2 by RT PCR (hospital order, performed in [facility] hospital lab) Nasopharyngeal Nasopharyngeal Swab     Status: Abnormal   Collection Time: 11/23/19  6:02 AM   Specimen: Nasopharyngeal Swab  Result Value Ref Range Status   SARS Coronavirus 2 POSITIVE (A) NEGATIVE Final    Comment: RESULT CALLED TO, READ BACK BY AND VERIFIED WITH: RN LESLIE SCHULAR 0725 11/23/2019 KB (NOTE) SARS-CoV-2 target nucleic acids are DETECTED  SARS-CoV-2 RNA is generally detectable in upper respiratory specimens  during the acute phase of infection.  Positive results are indicative  of the presence of the identified virus, but do not rule out bacterial infection or co-infection with other pathogens not detected by the  test.  Clinical correlation with patient history and  other diagnostic information is necessary to determine patient infection status.  The expected result is negative.  Fact Sheet for Patients:   BoilerBrush.com.cy   Fact Sheet for Healthcare Providers:   https://pope.com/    This test is not yet approved or cleared by the [place] FDA and  has been authorized for detection and/or diagnosis of SARS-CoV-2 by FDA under an Emergency Use Authorization (EUA).  This EUA will remain in effect (meaning this  test can be used) for the duration of  the COVID-19 declaration under Section 564(b)(1) of the Act, 21 U.S.C. section 360-bbb-3(b)(1), unless the authorization is terminated or revoked sooner.  Performed at [facility],  [address] [place], [place] [address]     Radiology Reports DG Chest 2 View  Result Date: 11/23/2019 CLINICAL DATA:  59 year old male with shortness of breath EXAM: CHEST - 2 VIEW COMPARISON:  Chest radiograph dated 03/04/2017. FINDINGS: Bibasilar and bilateral peripheral and subpleural streaky densities may represent atelectatic changes or atypical infection. Clinical correlation is recommended. No lobar consolidation, pleural effusion, or pneumothorax. The cardiac silhouette is within limits. Median sternotomy wires and CABG vascular clips. No acute osseous pathology. IMPRESSION: Bibasilar and subpleural streaky densities may represent atelectatic changes or atypical infection. Electronically Signed   By: Elgie Collard M.D.   On: 11/23/2019 03:36

## 2019-11-24 NOTE — Progress Notes (Signed)
Patient up from bed in the chair, currently  on room air, oxygen sat 91% without any complaints of SOB or discomfort. Pt educated on flutter valve and incentive spirometry

## 2019-11-24 NOTE — Progress Notes (Signed)
Inpatient Diabetes Program Recommendations  AACE/ADA: New Consensus Statement on Inpatient Glycemic Control (2015)  Target Ranges:  Prepandial:   less than 140 mg/dL      Peak postprandial:   less than 180 mg/dL (1-2 hours)      Critically ill patients:  140 - 180 mg/dL   Lab Results  Component Value Date   GLUCAP 231 (H) 11/24/2019   HGBA1C 7.6 (H) 09/23/2019    Review of Glycemic Control Results for Joshua Howell, MOGA (MRN [id number]) as of 11/24/2019 09:50  Ref. Range 11/23/2019 11:24 11/23/2019 15:46 11/23/2019 21:24 11/24/2019 07:55  Glucose-Capillary Latest Ref Range: 70 - 99 mg/dL 496 (H) 759 (H) 163 (H) 231 (H)   Diabetes history: DM2 Outpatient Diabetes medications: Metformin 1 gm bid Current orders for Inpatient glycemic control: Novolog moderate correction tid + hs 0-5 units + Decadron 6 mg qd  Inpatient Diabetes Program Recommendations:   While on steroids: -Increase Novolog correction to resistant tid + hs 0-5 units -Add Novolog 5 units tid meal coverage if eats 50%  Thank you, Darel Hong E. Gyasi Hazzard, RN, MSN, CDE  Diabetes Coordinator Inpatient Glycemic Control Team Team Pager [phone] (8am-5pm) 11/24/2019 9:52 AM

## 2019-11-25 ENCOUNTER — Encounter (HOSPITAL_COMMUNITY): Payer: Self-pay | Admitting: Internal Medicine

## 2019-11-25 LAB — COMPREHENSIVE METABOLIC PANEL
ALT: 117 U/L — ABNORMAL HIGH (ref 0–44)
AST: 120 U/L — ABNORMAL HIGH (ref 15–41)
Albumin: 2.4 g/dL — ABNORMAL LOW (ref 3.5–5.0)
Alkaline Phosphatase: 76 U/L (ref 38–126)
Anion gap: 11 (ref 5–15)
BUN: 24 mg/dL — ABNORMAL HIGH (ref 8–23)
CO2: 23 mmol/L (ref 22–32)
Calcium: 8.5 mg/dL — ABNORMAL LOW (ref 8.9–10.3)
Chloride: 99 mmol/L (ref 98–111)
Creatinine, Ser: 0.92 mg/dL (ref 0.61–1.24)
GFR calc Af Amer: >60 mL/min (ref >60)
GFR calc non Af Amer: >60 mL/min (ref >60)
Glucose, Bld: 233 mg/dL — ABNORMAL HIGH (ref 70–99)
Potassium: 4.8 mmol/L (ref 3.5–5.1)
Sodium: 133 mmol/L — ABNORMAL LOW (ref 135–145)
Total Bilirubin: 1.5 mg/dL — ABNORMAL HIGH (ref 0.3–1.2)
Total Protein: 6.9 g/dL (ref 6.5–8.1)

## 2019-11-25 LAB — CBC WITH DIFFERENTIAL/PLATELET
Abs Immature Granulocytes: 0.07 10*3/uL (ref 0.00–0.07)
Basophils Absolute: 0 10*3/uL (ref 0.0–0.1)
Basophils Relative: 0 %
Eosinophils Absolute: 0 10*3/uL (ref 0.0–0.5)
Eosinophils Relative: 0 %
HCT: 38.8 % — ABNORMAL LOW (ref 39.0–52.0)
Hemoglobin: 13.3 g/dL (ref 13.0–17.0)
Immature Granulocytes: 1 %
Lymphocytes Relative: 5 %
Lymphs Abs: 0.5 10*3/uL — ABNORMAL LOW (ref 0.7–4.0)
MCH: 31.4 pg (ref 26.0–34.0)
MCHC: 34.3 g/dL (ref 30.0–36.0)
MCV: 91.5 fL (ref 80.0–100.0)
Monocytes Absolute: 0.3 10*3/uL (ref 0.1–1.0)
Monocytes Relative: 3 %
Neutro Abs: 8.6 10*3/uL — ABNORMAL HIGH (ref 1.7–7.7)
Neutrophils Relative %: 91 %
Platelets: 333 10*3/uL (ref 150–400)
RBC: 4.24 MIL/uL (ref 4.22–5.81)
RDW: 12.6 % (ref 11.5–15.5)
WBC: 9.4 10*3/uL (ref 4.0–10.5)
nRBC: 0 % (ref 0.0–0.2)

## 2019-11-25 LAB — GLUCOSE, CAPILLARY
Glucose-Capillary: 240 mg/dL — ABNORMAL HIGH (ref 70–99)
Glucose-Capillary: 298 mg/dL — ABNORMAL HIGH (ref 70–99)
Glucose-Capillary: 296 mg/dL — ABNORMAL HIGH (ref 70–99)
Glucose-Capillary: 272 mg/dL — ABNORMAL HIGH (ref 70–99)

## 2019-11-25 LAB — C-REACTIVE PROTEIN: CRP: 13.1 mg/dL — ABNORMAL HIGH (ref <1.0)

## 2019-11-25 LAB — D-DIMER, QUANTITATIVE: D-Dimer, Quant: 2.08 ug/mL-FEU — ABNORMAL HIGH (ref 0.00–0.50)

## 2019-11-25 LAB — FERRITIN: Ferritin: 1927 ng/mL — ABNORMAL HIGH (ref 24–336)

## 2019-11-25 MED ORDER — INSULIN ASPART 100 UNIT/ML ~~LOC~~ SOLN
6.0000 [IU] | Freq: Three times a day (TID) | SUBCUTANEOUS | Status: DC
  Administered 2019-11-25 – 2019-11-29 (×12): 6 [IU] via SUBCUTANEOUS

## 2019-11-25 NOTE — Progress Notes (Signed)
Inpatient Diabetes Program Recommendations  AACE/ADA: New Consensus Statement on Inpatient Glycemic Control (2015)  Target Ranges:  Prepandial:   less than 140 mg/dL      Peak postprandial:   less than 180 mg/dL (1-2 hours)      Critically ill patients:  140 - 180 mg/dL   Lab Results  Component Value Date   GLUCAP 298 (H) 11/25/2019   HGBA1C 7.6 (H) 09/23/2019    Review of Glycemic Control Results for Joshua Howell, Joshua Howell (MRN [id number]) as of 11/25/2019 14:16  Ref. Range 11/24/2019 15:39 11/24/2019 16:10 11/24/2019 21:07 11/25/2019 07:38 11/25/2019 11:41  Glucose-Capillary Latest Ref Range: 70 - 99 mg/dL 924 (H) 462 (H) 863 (H) 240 (H) 298 (H)   Inpatient Diabetes Program Recommendations:   While on steroids: -Increase Novolog meal coverage to 8 units tid if eats 50%  Thank you, Billy Fischer. Shakhia Gramajo, RN, MSN, CDE  Diabetes Coordinator Inpatient Glycemic Control Team Team Pager [phone] (8am-5pm) 11/25/2019 2:18 PM

## 2019-11-25 NOTE — Progress Notes (Signed)
Progress Note    Joshua Howell  MAU:[id number] DOB: [birth year]  DOA: 11/23/2019 PCP: Jac Canavan, PA-C      Brief Narrative:    Medical records reviewed and are as summarized below:  Joshua Howell is a 59 y.o. male with PMHx of DM-2, CAD s/p CABG, morbid obesity, HLD-presented to the hospital with shortness of breath.  He was admitted to the hospital for acute hypoxemic respiratory failure secondary to COVID-19 pneumonia.     Assessment/Plan:   Principal Problem:   Acute hypoxemic respiratory failure due to COVID-19 [facility]) Active Problems:   Uncontrolled type 2 diabetes mellitus with complication, without long-term current use of insulin (HCC)   Obesity, Class II, BMI 35-39.9   Mixed dyslipidemia   S/P CABG x 4   COVID-19 pneumonia,: continue IV remdesivir and IV steroids.  Acute hypoxemic respiratory failure: Continue oxygen via nasal cannula.  He is on 1 L/min oxygen.  Wean off oxygen as able.  Type 2 diabetes mellitus with hyperglycemia: Steroids contributing to hyperglycemia.  Continue Lantus.  Increase NovoLog with meals.  CAD: Continue aspirin and statin  Body mass index is 37.3 kg/m.  (Morbid obesity): This complicates overall care and prognosis.  Diet Order            Diet heart healthy/carb modified Room service appropriate? Yes; Fluid consistency: Thin  Diet effective now                       Medications:   . aspirin EC  81 mg Oral Daily  . atorvastatin  80 mg Oral q1800  . enoxaparin (LOVENOX) injection  60 mg Subcutaneous Q24H  . insulin aspart  0-20 Units Subcutaneous TID WC  . insulin aspart  0-5 Units Subcutaneous QHS  . insulin aspart  4 Units Subcutaneous TID WC  . insulin glargine  15 Units Subcutaneous Daily  . methylPREDNISolone (SOLU-MEDROL) injection  60 mg Intravenous Q12H  . nystatin  5 mL Oral QID  . sodium chloride flush  3 mL Intravenous Q12H   Continuous Infusions: . sodium chloride    . remdesivir  100 mg in NS 100 mL 100 mg (11/25/19 0913)     Anti-infectives (From admission, onward)   Start     Dose/Rate Route Frequency Ordered Stop   11/24/19 1000  remdesivir 100 mg in sodium chloride 0.9 % 100 mL IVPB     Discontinue    "Followed by" Linked Group Details   100 mg 200 mL/hr over 30 Minutes Intravenous Daily 11/23/19 0920 11/28/19 0959   11/23/19 1000  remdesivir 200 mg in sodium chloride 0.9% 250 mL IVPB       "Followed by" Linked Group Details   200 mg 580 mL/hr over 30 Minutes Intravenous Once 11/23/19 0920 11/23/19 1126             Family Communication/Anticipated D/C date and plan/Code Status   DVT prophylaxis:      Code Status: Full Code  Family Communication: Plan discussed with patient Disposition Plan:    Status is: Inpatient  Remains inpatient appropriate because:IV treatments appropriate due to intensity of illness or inability to take PO and Inpatient level of care appropriate due to severity of illness   Dispo: The patient is from: Home              Anticipated d/c is to: Home              Anticipated  d/c date is: 3 days              Patient currently is not medically stable to d/c.           Subjective:   C/o cough. No shortness of breath  Objective:    Vitals:   11/24/19 2341 11/25/19 0351 11/25/19 0700 11/25/19 1444  BP: 120/71 103/71 118/71 101/63  Pulse: 74 67 71 77  Resp: 19 (!) 22 17 19   Temp: 98.6 F (37 C) 98.6 F (37 C) 97.8 F (36.6 C) 97.7 F (36.5 C)  TempSrc: Oral Oral Oral Oral  SpO2: 94% 94% 93% 90%  Weight:      Height:       No data found.   Intake/Output Summary (Last 24 hours) at 11/25/2019 1450 Last data filed at 11/25/2019 1104 Gross per 24 hour  Intake 3 ml  Output --  Net 3 ml   Filed Weights   11/23/19 0322  Weight: 124.7 kg    Exam:  GEN: NAD SKIN: No rash EYES: EOMI ENT: MMM CV: RRR PULM: CTA B ABD: soft, ND, NT, +BS CNS: AAO x 3, non focal EXT: No edema or  tenderness   Data Reviewed:   I have personally reviewed following labs and imaging studies:  Labs: Labs show the following:   Basic Metabolic Panel: Recent Labs  Lab 11/23/19 0320 11/23/19 0320 11/24/19 0248 11/25/19 0218  NA 133*  --  132* 133*  K 4.7   < > 4.3 4.8  CL 97*  --  99 99  CO2 23  --  22 23  GLUCOSE 226*  --  250* 233*  BUN 13  --  18 24*  CREATININE 1.12  --  0.92 0.92  CALCIUM 8.5*  --  8.3* 8.5*  MG  --   --  2.1  --   PHOS  --   --  3.2  --    < > = values in this interval not displayed.   GFR Estimated Creatinine Clearance: 115 mL/min (by C-G formula based on SCr of 0.92 mg/dL). Liver Function Tests: Recent Labs  Lab 11/23/19 0320 11/24/19 0248 11/25/19 0218  AST 93* 129* 120*  ALT 68* 104* 117*  ALKPHOS 81 79 76  BILITOT 1.5* 1.4* 1.5*  PROT 7.8 6.7 6.9  ALBUMIN 2.8* 2.3* 2.4*   No results for input(s): LIPASE, AMYLASE in the last 168 hours. No results for input(s): AMMONIA in the last 168 hours. Coagulation profile No results for input(s): INR, PROTIME in the last 168 hours.  CBC: Recent Labs  Lab 11/23/19 0320 11/24/19 0248 11/25/19 0218  WBC 9.8 6.6 9.4  NEUTROABS  --  5.8 8.6*  HGB 14.9 13.8 13.3  HCT 44.0 41.1 38.8*  MCV 94.2 94.9 91.5  PLT 281 269 333   Cardiac Enzymes: No results for input(s): CKTOTAL, CKMB, CKMBINDEX, TROPONINI in the last 168 hours. BNP (last 3 results) No results for input(s): PROBNP in the last 8760 hours. CBG: Recent Labs  Lab 11/24/19 1539 11/24/19 1610 11/24/19 2107 11/25/19 0738 11/25/19 1141  GLUCAP 332* 333* 260* 240* 298*   D-Dimer: Recent Labs    11/24/19 0248 11/25/19 0218  DDIMER 1.88* 2.08*   Hgb A1c: No results for input(s): HGBA1C in the last 72 hours. Lipid Profile: No results for input(s): CHOL, HDL, LDLCALC, TRIG, CHOLHDL, LDLDIRECT in the last 72 hours. Thyroid function studies: No results for input(s): TSH, T4TOTAL, T3FREE, THYROIDAB in the last 72  hours.  Invalid input(s): FREET3 Anemia work up: Recent Labs    11/24/19 0248 11/25/19 0218  FERRITIN 1,796* 1,927*   Sepsis Labs: Recent Labs  Lab 11/23/19 0320 11/23/19 1105 11/24/19 0248 11/25/19 0218  PROCALCITON  --  0.11  --   --   WBC 9.8  --  6.6 9.4    Microbiology Recent Results (from the past 240 hour(s))  SARS Coronavirus 2 by RT PCR (hospital order, performed in [facility] hospital lab) Nasopharyngeal Nasopharyngeal Swab     Status: Abnormal   Collection Time: 11/23/19  6:02 AM   Specimen: Nasopharyngeal Swab  Result Value Ref Range Status   SARS Coronavirus 2 POSITIVE (A) NEGATIVE Final    Comment: RESULT CALLED TO, READ BACK BY AND VERIFIED WITH: RN LESLIE SCHULAR 0725 11/23/2019 KB (NOTE) SARS-CoV-2 target nucleic acids are DETECTED  SARS-CoV-2 RNA is generally detectable in upper respiratory specimens  during the acute phase of infection.  Positive results are indicative  of the presence of the identified virus, but do not rule out bacterial infection or co-infection with other pathogens not detected by the test.  Clinical correlation with patient history and  other diagnostic information is necessary to determine patient infection status.  The expected result is negative.  Fact Sheet for Patients:   BoilerBrush.com.cy   Fact Sheet for Healthcare Providers:   https://pope.com/    This test is not yet approved or cleared by the [place] FDA and  has been authorized for detection and/or diagnosis of SARS-CoV-2 by FDA under an Emergency Use Authorization (EUA).  This EUA will remain in effect (meaning this  test can be used) for the duration of  the COVID-19 declaration under Section 564(b)(1) of the Act, 21 U.S.C. section 360-bbb-3(b)(1), unless the authorization is terminated or revoked sooner.  Performed at [facility], [address] [place], [place] [address]     Procedures and  diagnostic studies:  No results found.             LOS: 2 days   Francis Doenges  [facility]     11/25/2019, 2:50 PM

## 2019-11-25 NOTE — Progress Notes (Signed)
Patient up in the chair on room air , oxygen sat 90%, pt educated on incentive spirometry and flutter valve use. No complaints of SOB or discomfort.

## 2019-11-26 LAB — CBC WITH DIFFERENTIAL/PLATELET
Abs Immature Granulocytes: 0.07 10*3/uL (ref 0.00–0.07)
Basophils Absolute: 0 10*3/uL (ref 0.0–0.1)
Basophils Relative: 0 %
Eosinophils Absolute: 0 10*3/uL (ref 0.0–0.5)
Eosinophils Relative: 0 %
HCT: 40 % (ref 39.0–52.0)
Hemoglobin: 13.4 g/dL (ref 13.0–17.0)
Immature Granulocytes: 1 %
Lymphocytes Relative: 5 %
Lymphs Abs: 0.5 10*3/uL — ABNORMAL LOW (ref 0.7–4.0)
MCH: 31.2 pg (ref 26.0–34.0)
MCHC: 33.5 g/dL (ref 30.0–36.0)
MCV: 93.2 fL (ref 80.0–100.0)
Monocytes Absolute: 0.4 10*3/uL (ref 0.1–1.0)
Monocytes Relative: 4 %
Neutro Abs: 9.1 10*3/uL — ABNORMAL HIGH (ref 1.7–7.7)
Neutrophils Relative %: 90 %
Platelets: 377 10*3/uL (ref 150–400)
RBC: 4.29 MIL/uL (ref 4.22–5.81)
RDW: 12.9 % (ref 11.5–15.5)
WBC: 10.1 10*3/uL (ref 4.0–10.5)
nRBC: 0 % (ref 0.0–0.2)

## 2019-11-26 LAB — GLUCOSE, CAPILLARY
Glucose-Capillary: 210 mg/dL — ABNORMAL HIGH (ref 70–99)
Glucose-Capillary: 212 mg/dL — ABNORMAL HIGH (ref 70–99)
Glucose-Capillary: 219 mg/dL — ABNORMAL HIGH (ref 70–99)
Glucose-Capillary: 203 mg/dL — ABNORMAL HIGH (ref 70–99)

## 2019-11-26 LAB — FERRITIN: Ferritin: 1442 ng/mL — ABNORMAL HIGH (ref 24–336)

## 2019-11-26 LAB — COMPREHENSIVE METABOLIC PANEL
ALT: 105 U/L — ABNORMAL HIGH (ref 0–44)
AST: 89 U/L — ABNORMAL HIGH (ref 15–41)
Albumin: 2.3 g/dL — ABNORMAL LOW (ref 3.5–5.0)
Alkaline Phosphatase: 73 U/L (ref 38–126)
Anion gap: 11 (ref 5–15)
BUN: 22 mg/dL (ref 8–23)
CO2: 24 mmol/L (ref 22–32)
Calcium: 8.3 mg/dL — ABNORMAL LOW (ref 8.9–10.3)
Chloride: 100 mmol/L (ref 98–111)
Creatinine, Ser: 1.02 mg/dL (ref 0.61–1.24)
GFR calc Af Amer: >60 mL/min (ref >60)
GFR calc non Af Amer: >60 mL/min (ref >60)
Glucose, Bld: 223 mg/dL — ABNORMAL HIGH (ref 70–99)
Potassium: 4.7 mmol/L (ref 3.5–5.1)
Sodium: 135 mmol/L (ref 135–145)
Total Bilirubin: 0.7 mg/dL (ref 0.3–1.2)
Total Protein: 6.5 g/dL (ref 6.5–8.1)

## 2019-11-26 LAB — D-DIMER, QUANTITATIVE: D-Dimer, Quant: 2.66 ug/mL-FEU — ABNORMAL HIGH (ref 0.00–0.50)

## 2019-11-26 LAB — C-REACTIVE PROTEIN: CRP: 7.9 mg/dL — ABNORMAL HIGH (ref <1.0)

## 2019-11-26 NOTE — Progress Notes (Signed)
Progress Note    Joshua Howell  [id number] DOB: 12-14-1960  DOA: 11/23/2019 PCP: Jac Canavan, PA-C      Brief Narrative:    Medical records reviewed and are as summarized below:  Joshua Howell is a 59 y.o. male with PMHx of DM-2, CAD s/p CABG, morbid obesity, HLD-presented to the hospital with shortness of breath.  He was admitted to the hospital for acute hypoxemic respiratory failure secondary to COVID-19 pneumonia.     Assessment/Plan:   Principal Problem:   Acute hypoxemic respiratory failure due to COVID-19 [facility]) Active Problems:   Uncontrolled type 2 diabetes mellitus with complication, without long-term current use of insulin (HCC)   Obesity, Class II, BMI 35-39.9   Mixed dyslipidemia   S/P CABG x 4   COVID-19 pneumonia: continue IV remdesivir and IV steroids.  Acute hypoxemic respiratory failure: No change in oxygen requirement.  Continue 1 L/min oxygen via nasal cannula. Wean off oxygen as able.  Type 2 diabetes mellitus with hyperglycemia: Steroids contributing to hyperglycemia.  Continue Lantus.  Increase NovoLog with meals.  CAD: Continue aspirin and statin  Body mass index is 37.3 kg/m.  (Morbid obesity): This complicates overall care and prognosis.  Diet Order            Diet heart healthy/carb modified Room service appropriate? Yes; Fluid consistency: Thin  Diet effective now                       Medications:    aspirin EC  81 mg Oral Daily   atorvastatin  80 mg Oral q1800   enoxaparin (LOVENOX) injection  60 mg Subcutaneous Q24H   insulin aspart  0-20 Units Subcutaneous TID WC   insulin aspart  0-5 Units Subcutaneous QHS   insulin aspart  6 Units Subcutaneous TID WC   insulin glargine  15 Units Subcutaneous Daily   methylPREDNISolone (SOLU-MEDROL) injection  60 mg Intravenous Q12H   nystatin  5 mL Oral QID   sodium chloride flush  3 mL Intravenous Q12H   Continuous Infusions:  sodium chloride      remdesivir 100 mg in NS 100 mL Stopped (11/26/19 1400)     Anti-infectives (From admission, onward)   Start     Dose/Rate Route Frequency Ordered Stop   11/24/19 1000  remdesivir 100 mg in sodium chloride 0.9 % 100 mL IVPB     Discontinue    "Followed by" Linked Group Details   100 mg 200 mL/hr over 30 Minutes Intravenous Daily 11/23/19 0920 11/28/19 0959   11/23/19 1000  remdesivir 200 mg in sodium chloride 0.9% 250 mL IVPB       "Followed by" Linked Group Details   200 mg 580 mL/hr over 30 Minutes Intravenous Once 11/23/19 0920 11/23/19 1126             Family Communication/Anticipated D/C date and plan/Code Status   DVT prophylaxis: Lovenox     Code Status: Full Code  Family Communication: Plan discussed with patient Disposition Plan:    Status is: Inpatient  Remains inpatient appropriate because:IV treatments appropriate due to intensity of illness or inability to take PO and Inpatient level of care appropriate due to severity of illness   Dispo: The patient is from: Home              Anticipated d/c is to: Home              Anticipated d/c  date is: 3 days              Patient currently is not medically stable to d/c.           Subjective:   C/o oxygen desaturation with minimal exertion or talking.  Objective:    Vitals:   11/25/19 1444 11/25/19 1938 11/26/19 0812 11/26/19 1600  BP: 101/63 122/67 127/79 132/62  Pulse: 77 76 71 75  Resp: 19 19  19   Temp: 97.7 F (36.5 C) 98 F (36.7 C) 98 F (36.7 C) (!) 97.5 F (36.4 C)  TempSrc: Oral Oral Oral Oral  SpO2: 90% 95% 90% 90%  Weight:      Height:       No data found.   Intake/Output Summary (Last 24 hours) at 11/26/2019 1622 Last data filed at 11/26/2019 1400 Gross per 24 hour  Intake 583 ml  Output --  Net 583 ml   Filed Weights   11/23/19 0322  Weight: 124.7 kg    Exam:  GEN: NAD SKIN: Warm and dry EYES: EOMI ENT: MMM CV: RRR PULM: No wheezing or rales heard ABD:  soft, obese, NT, +BS CNS: AAO x 3, non focal EXT: No edema or tenderness   Data Reviewed:   I have personally reviewed following labs and imaging studies:  Labs: Labs show the following:   Basic Metabolic Panel: Recent Labs  Lab 11/23/19 0320 11/23/19 0320 11/24/19 0248 11/24/19 0248 11/25/19 0218 11/26/19 0340  NA 133*  --  132*  --  133* 135  K 4.7   < > 4.3   < > 4.8 4.7  CL 97*  --  99  --  99 100  CO2 23  --  22  --  23 24  GLUCOSE 226*  --  250*  --  233* 223*  BUN 13  --  18  --  24* 22  CREATININE 1.12  --  0.92  --  0.92 1.02  CALCIUM 8.5*  --  8.3*  --  8.5* 8.3*  MG  --   --  2.1  --   --   --   PHOS  --   --  3.2  --   --   --    < > = values in this interval not displayed.   GFR Estimated Creatinine Clearance: 103.7 mL/min (by C-G formula based on SCr of 1.02 mg/dL). Liver Function Tests: Recent Labs  Lab 11/23/19 0320 11/24/19 0248 11/25/19 0218 11/26/19 0340  AST 93* 129* 120* 89*  ALT 68* 104* 117* 105*  ALKPHOS 81 79 76 73  BILITOT 1.5* 1.4* 1.5* 0.7  PROT 7.8 6.7 6.9 6.5  ALBUMIN 2.8* 2.3* 2.4* 2.3*   No results for input(s): LIPASE, AMYLASE in the last 168 hours. No results for input(s): AMMONIA in the last 168 hours. Coagulation profile No results for input(s): INR, PROTIME in the last 168 hours.  CBC: Recent Labs  Lab 11/23/19 0320 11/24/19 0248 11/25/19 0218 11/26/19 0340  WBC 9.8 6.6 9.4 10.1  NEUTROABS  --  5.8 8.6* 9.1*  HGB 14.9 13.8 13.3 13.4  HCT 44.0 41.1 38.8* 40.0  MCV 94.2 94.9 91.5 93.2  PLT 281 269 333 377   Cardiac Enzymes: No results for input(s): CKTOTAL, CKMB, CKMBINDEX, TROPONINI in the last 168 hours. BNP (last 3 results) No results for input(s): PROBNP in the last 8760 hours. CBG: Recent Labs  Lab 11/25/19 1141 11/25/19 1614 11/25/19 2051 11/26/19 0746 11/26/19 1237  GLUCAP 298* 296* 272* 210* 212*   D-Dimer: Recent Labs    11/25/19 0218 11/26/19 0340  DDIMER 2.08* 2.66*   Hgb A1c: No  results for input(s): HGBA1C in the last 72 hours. Lipid Profile: No results for input(s): CHOL, HDL, LDLCALC, TRIG, CHOLHDL, LDLDIRECT in the last 72 hours. Thyroid function studies: No results for input(s): TSH, T4TOTAL, T3FREE, THYROIDAB in the last 72 hours.  Invalid input(s): FREET3 Anemia work up: Recent Labs    11/25/19 0218 11/26/19 0340  FERRITIN 1,927* 1,442*   Sepsis Labs: Recent Labs  Lab 11/23/19 0320 11/23/19 1105 11/24/19 0248 11/25/19 0218 11/26/19 0340  PROCALCITON  --  0.11  --   --   --   WBC 9.8  --  6.6 9.4 10.1    Microbiology Recent Results (from the past 240 hour(s))  SARS Coronavirus 2 by RT PCR (hospital order, performed in [facility] hospital lab) Nasopharyngeal Nasopharyngeal Swab     Status: Abnormal   Collection Time: 11/23/19  6:02 AM   Specimen: Nasopharyngeal Swab  Result Value Ref Range Status   SARS Coronavirus 2 POSITIVE (A) NEGATIVE Final    Comment: RESULT CALLED TO, READ BACK BY AND VERIFIED WITH: RN LESLIE SCHULAR 0725 11/23/2019 KB (NOTE) SARS-CoV-2 target nucleic acids are DETECTED  SARS-CoV-2 RNA is generally detectable in upper respiratory specimens  during the acute phase of infection.  Positive results are indicative  of the presence of the identified virus, but do not rule out bacterial infection or co-infection with other pathogens not detected by the test.  Clinical correlation with patient history and  other diagnostic information is necessary to determine patient infection status.  The expected result is negative.  Fact Sheet for Patients:   BoilerBrush.com.cy   Fact Sheet for Healthcare Providers:   https://pope.com/    This test is not yet approved or cleared by the [place] FDA and  has been authorized for detection and/or diagnosis of SARS-CoV-2 by FDA under an Emergency Use Authorization (EUA).  This EUA will remain in effect (meaning this  test can be  used) for the duration of  the COVID-19 declaration under Section 564(b)(1) of the Act, 21 U.S.C. section 360-bbb-3(b)(1), unless the authorization is terminated or revoked sooner.  Performed at [facility], [address], [place], [place] [address]     Procedures and diagnostic studies:  No results found.             LOS: 3 days   Corrin Sieling  [facility]     11/26/2019, 4:22 PM

## 2019-11-26 NOTE — Plan of Care (Signed)
  Problem: Respiratory: Goal: Will maintain a patent airway Outcome: Progressing Goal: Complications related to the disease process, condition or treatment will be avoided or minimized Outcome: Progressing   

## 2019-11-27 DIAGNOSIS — E669 Obesity, unspecified: Secondary | ICD-10-CM

## 2019-11-27 LAB — D-DIMER, QUANTITATIVE: D-Dimer, Quant: 3.58 ug/mL-FEU — ABNORMAL HIGH (ref 0.00–0.50)

## 2019-11-27 LAB — COMPREHENSIVE METABOLIC PANEL
ALT: 90 U/L — ABNORMAL HIGH (ref 0–44)
AST: 69 U/L — ABNORMAL HIGH (ref 15–41)
Albumin: 2.2 g/dL — ABNORMAL LOW (ref 3.5–5.0)
Alkaline Phosphatase: 67 U/L (ref 38–126)
Anion gap: 10 (ref 5–15)
BUN: 20 mg/dL (ref 8–23)
CO2: 23 mmol/L (ref 22–32)
Calcium: 8.2 mg/dL — ABNORMAL LOW (ref 8.9–10.3)
Chloride: 103 mmol/L (ref 98–111)
Creatinine, Ser: 0.91 mg/dL (ref 0.61–1.24)
GFR calc Af Amer: >60 mL/min (ref >60)
GFR calc non Af Amer: >60 mL/min (ref >60)
Glucose, Bld: 215 mg/dL — ABNORMAL HIGH (ref 70–99)
Potassium: 5.1 mmol/L (ref 3.5–5.1)
Sodium: 136 mmol/L (ref 135–145)
Total Bilirubin: 0.8 mg/dL (ref 0.3–1.2)
Total Protein: 6.3 g/dL — ABNORMAL LOW (ref 6.5–8.1)

## 2019-11-27 LAB — CBC WITH DIFFERENTIAL/PLATELET
Abs Immature Granulocytes: 0.13 10*3/uL — ABNORMAL HIGH (ref 0.00–0.07)
Basophils Absolute: 0 10*3/uL (ref 0.0–0.1)
Basophils Relative: 0 %
Eosinophils Absolute: 0 10*3/uL (ref 0.0–0.5)
Eosinophils Relative: 0 %
HCT: 40.3 % (ref 39.0–52.0)
Hemoglobin: 13.3 g/dL (ref 13.0–17.0)
Immature Granulocytes: 1 %
Lymphocytes Relative: 6 %
Lymphs Abs: 0.5 10*3/uL — ABNORMAL LOW (ref 0.7–4.0)
MCH: 31.5 pg (ref 26.0–34.0)
MCHC: 33 g/dL (ref 30.0–36.0)
MCV: 95.5 fL (ref 80.0–100.0)
Monocytes Absolute: 0.4 10*3/uL (ref 0.1–1.0)
Monocytes Relative: 5 %
Neutro Abs: 8.1 10*3/uL — ABNORMAL HIGH (ref 1.7–7.7)
Neutrophils Relative %: 88 %
Platelets: 394 10*3/uL (ref 150–400)
RBC: 4.22 MIL/uL (ref 4.22–5.81)
RDW: 13.1 % (ref 11.5–15.5)
WBC: 9.2 10*3/uL (ref 4.0–10.5)
nRBC: 0.2 % (ref 0.0–0.2)

## 2019-11-27 LAB — FERRITIN: Ferritin: 832 ng/mL — ABNORMAL HIGH (ref 24–336)

## 2019-11-27 LAB — GLUCOSE, CAPILLARY
Glucose-Capillary: 174 mg/dL — ABNORMAL HIGH (ref 70–99)
Glucose-Capillary: 209 mg/dL — ABNORMAL HIGH (ref 70–99)
Glucose-Capillary: 240 mg/dL — ABNORMAL HIGH (ref 70–99)
Glucose-Capillary: 241 mg/dL — ABNORMAL HIGH (ref 70–99)

## 2019-11-27 LAB — C-REACTIVE PROTEIN: CRP: 4.8 mg/dL — ABNORMAL HIGH (ref <1.0)

## 2019-11-27 NOTE — Plan of Care (Signed)
  Problem: Respiratory: Goal: Will maintain a patent airway Outcome: Progressing Goal: Complications related to the disease process, condition or treatment will be avoided or minimized Outcome: Progressing   

## 2019-11-27 NOTE — Progress Notes (Signed)
Inpatient Diabetes Program Recommendations  AACE/ADA: New Consensus Statement on Inpatient Glycemic Control (2015)  Target Ranges:  Prepandial:   less than 140 mg/dL      Peak postprandial:   less than 180 mg/dL (1-2 hours)      Critically ill patients:  140 - 180 mg/dL   Lab Results  Component Value Date   GLUCAP 209 (H) 11/27/2019   HGBA1C 7.6 (H) 09/23/2019    Inpatient Diabetes Program Recommendations:   While on steroids: -Increase Novolog meal coverage to 8 units tid if eats 50%  Thanks, Lujean Rave, MSN, RNC-OB Diabetes Coordinator [phone] (8a-5p)

## 2019-11-27 NOTE — Progress Notes (Signed)
SATURATION QUALIFICATIONS: (This note is used to comply with regulatory documentation for home oxygen)  Patient Saturations on Room Air at Rest = 93%  Patient Saturations on Room Air while Ambulating = 81%  Patient Saturations on 7 Liters of oxygen while Ambulating = 90%  Pt desatted to 81% while walking.  I applied oxgyen at 2L [place], but pt's oxygen level remained at 81%.  I had to increase his oxygen to 7L [place] to get it to 90%.

## 2019-11-27 NOTE — Progress Notes (Signed)
Progress Note    Joshua Howell  [id number] DOB: [birth year]  DOA: 11/23/2019 PCP: Jac Canavan, PA-C      Brief Narrative:    Medical records reviewed and are as summarized below:  Joshua Howell is a 59 y.o. male with PMHx of DM-2, CAD s/p CABG, morbid obesity, HLD-presented to the hospital with shortness of breath.  He was admitted to the hospital for acute hypoxemic respiratory failure secondary to COVID-19 pneumonia.     Assessment/Plan:   Principal Problem:   Acute hypoxemic respiratory failure due to COVID-19 [facility]) Active Problems:   Uncontrolled type 2 diabetes mellitus with complication, without long-term current use of insulin (HCC)   Obesity, Class II, BMI 35-39.9   Mixed dyslipidemia   S/P CABG x 4   COVID-19 pneumonia: Completed IV remdesivir today (11/27/2019).  Continue IV steroids.  Acute hypoxemic respiratory failure: Attempt to wean patient off of oxygen was unsuccessful.  Oxygen saturation dropped to 70% with minimal exertion/walking.  According to his nurse, he was 81% on 2 L/min and he had to place him on 7 L/min oxygen to get him to 90% oxygen saturation.  He may need home oxygen if weaning is unsuccessful.  Type 2 diabetes mellitus with hyperglycemia: Steroids contributing to hyperglycemia.  Continue Lantus and NovoLog.  CAD: Continue aspirin and statin  Mild hyponatremia: Improved  Body mass index is 37.3 kg/m.  (Morbid obesity): This complicates overall care and prognosis.  Diet Order            Diet heart healthy/carb modified Room service appropriate? Yes; Fluid consistency: Thin  Diet effective now                       Medications:   . aspirin EC  81 mg Oral Daily  . atorvastatin  80 mg Oral q1800  . enoxaparin (LOVENOX) injection  60 mg Subcutaneous Q24H  . insulin aspart  0-20 Units Subcutaneous TID WC  . insulin aspart  0-5 Units Subcutaneous QHS  . insulin aspart  6 Units Subcutaneous TID WC  . insulin  glargine  15 Units Subcutaneous Daily  . methylPREDNISolone (SOLU-MEDROL) injection  60 mg Intravenous Q12H  . nystatin  5 mL Oral QID  . sodium chloride flush  3 mL Intravenous Q12H   Continuous Infusions: . sodium chloride       Anti-infectives (From admission, onward)   Start     Dose/Rate Route Frequency Ordered Stop   11/24/19 1000  remdesivir 100 mg in sodium chloride 0.9 % 100 mL IVPB       "Followed by" Linked Group Details   100 mg 200 mL/hr over 30 Minutes Intravenous Daily 11/23/19 0920 11/27/19 0948   11/23/19 1000  remdesivir 200 mg in sodium chloride 0.9% 250 mL IVPB       "Followed by" Linked Group Details   200 mg 580 mL/hr over 30 Minutes Intravenous Once 11/23/19 0920 11/23/19 1126             Family Communication/Anticipated D/C date and plan/Code Status   DVT prophylaxis: Lovenox     Code Status: Full Code  Family Communication: Plan discussed with patient Disposition Plan:    Status is: Inpatient  Remains inpatient appropriate because:IV treatments appropriate due to intensity of illness or inability to take PO and Inpatient level of care appropriate due to severity of illness   Dispo: The patient is from: Home  Anticipated d/c is to: Home              Anticipated d/c date is: 3 days              Patient currently is not medically stable to d/c.           Subjective:   No chest pain, cough or shortness of breath.  However, he still has oxygen desaturation with minimal exertion.  Objective:    Vitals:   11/26/19 1600 11/26/19 2119 11/27/19 0509 11/27/19 0914  BP: 132/62 125/70 115/63 113/68  Pulse: 75 73 61 73  Resp: 19 17 20 17   Temp: (!) 97.5 F (36.4 C) 97.6 F (36.4 C) 97.6 F (36.4 C) 98 F (36.7 C)  TempSrc: Oral Oral Oral Oral  SpO2: 90% (!) 88% 96% 91%  Weight:      Height:       No data found.   Intake/Output Summary (Last 24 hours) at 11/27/2019 1151 Last data filed at 11/27/2019 0921 Gross  per 24 hour  Intake 583 ml  Output --  Net 583 ml   Filed Weights   11/23/19 0322  Weight: 124.7 kg    Exam:  GEN: No acute distress SKIN: Warm and dry EYES: No pallor or icterus ENT: MMM CV: RRR PULM: Clear to auscultation bilaterally ABD: soft, obese, NT, +BS CNS: AAO x 3, non focal EXT: No edema or tenderness   Data Reviewed:   I have personally reviewed following labs and imaging studies:  Labs: Labs show the following:   Basic Metabolic Panel: Recent Labs  Lab 11/23/19 0320 11/23/19 0320 11/24/19 0248 11/24/19 0248 11/25/19 0218 11/25/19 0218 11/26/19 0340 11/27/19 0519  NA 133*  --  132*  --  133*  --  135 136  K 4.7   < > 4.3   < > 4.8   < > 4.7 5.1  CL 97*  --  99  --  99  --  100 103  CO2 23  --  22  --  23  --  24 23  GLUCOSE 226*  --  250*  --  233*  --  223* 215*  BUN 13  --  18  --  24*  --  22 20  CREATININE 1.12  --  0.92  --  0.92  --  1.02 0.91  CALCIUM 8.5*  --  8.3*  --  8.5*  --  8.3* 8.2*  MG  --   --  2.1  --   --   --   --   --   PHOS  --   --  3.2  --   --   --   --   --    < > = values in this interval not displayed.   GFR Estimated Creatinine Clearance: 116.2 mL/min (by C-G formula based on SCr of 0.91 mg/dL). Liver Function Tests: Recent Labs  Lab 11/23/19 0320 11/24/19 0248 11/25/19 0218 11/26/19 0340 11/27/19 0519  AST 93* 129* 120* 89* 69*  ALT 68* 104* 117* 105* 90*  ALKPHOS 81 79 76 73 67  BILITOT 1.5* 1.4* 1.5* 0.7 0.8  PROT 7.8 6.7 6.9 6.5 6.3*  ALBUMIN 2.8* 2.3* 2.4* 2.3* 2.2*   No results for input(s): LIPASE, AMYLASE in the last 168 hours. No results for input(s): AMMONIA in the last 168 hours. Coagulation profile No results for input(s): INR, PROTIME in the last 168 hours.  CBC: Recent Labs  Lab  11/23/19 0320 11/24/19 0248 11/25/19 0218 11/26/19 0340 11/27/19 0519  WBC 9.8 6.6 9.4 10.1 9.2  NEUTROABS  --  5.8 8.6* 9.1* 8.1*  HGB 14.9 13.8 13.3 13.4 13.3  HCT 44.0 41.1 38.8* 40.0 40.3  MCV 94.2  94.9 91.5 93.2 95.5  PLT 281 269 333 377 394   Cardiac Enzymes: No results for input(s): CKTOTAL, CKMB, CKMBINDEX, TROPONINI in the last 168 hours. BNP (last 3 results) No results for input(s): PROBNP in the last 8760 hours. CBG: Recent Labs  Lab 11/26/19 1237 11/26/19 1732 11/26/19 2123 11/27/19 0730 11/27/19 1144  GLUCAP 212* 219* 203* 174* 209*   D-Dimer: Recent Labs    11/26/19 0340 11/27/19 0519  DDIMER 2.66* 3.58*   Hgb A1c: No results for input(s): HGBA1C in the last 72 hours. Lipid Profile: No results for input(s): CHOL, HDL, LDLCALC, TRIG, CHOLHDL, LDLDIRECT in the last 72 hours. Thyroid function studies: No results for input(s): TSH, T4TOTAL, T3FREE, THYROIDAB in the last 72 hours.  Invalid input(s): FREET3 Anemia work up: Recent Labs    11/26/19 0340 11/27/19 0519  FERRITIN 1,442* 832*   Sepsis Labs: Recent Labs  Lab 11/23/19 0320 11/23/19 1105 11/24/19 0248 11/25/19 0218 11/26/19 0340 11/27/19 0519  PROCALCITON  --  0.11  --   --   --   --   WBC   < >  --  6.6 9.4 10.1 9.2   < > = values in this interval not displayed.    Microbiology Recent Results (from the past 240 hour(s))  SARS Coronavirus 2 by RT PCR (hospital order, performed in [facility] hospital lab) Nasopharyngeal Nasopharyngeal Swab     Status: Abnormal   Collection Time: 11/23/19  6:02 AM   Specimen: Nasopharyngeal Swab  Result Value Ref Range Status   SARS Coronavirus 2 POSITIVE (A) NEGATIVE Final    Comment: RESULT CALLED TO, READ BACK BY AND VERIFIED WITH: RN LESLIE SCHULAR 0725 11/23/2019 KB (NOTE) SARS-CoV-2 target nucleic acids are DETECTED  SARS-CoV-2 RNA is generally detectable in upper respiratory specimens  during the acute phase of infection.  Positive results are indicative  of the presence of the identified virus, but do not rule out bacterial infection or co-infection with other pathogens not detected by the test.  Clinical correlation with patient history  and  other diagnostic information is necessary to determine patient infection status.  The expected result is negative.  Fact Sheet for Patients:   BoilerBrush.com.cy   Fact Sheet for Healthcare Providers:   https://pope.com/    This test is not yet approved or cleared by the [place] FDA and  has been authorized for detection and/or diagnosis of SARS-CoV-2 by FDA under an Emergency Use Authorization (EUA).  This EUA will remain in effect (meaning this  test can be used) for the duration of  the COVID-19 declaration under Section 564(b)(1) of the Act, 21 U.S.C. section 360-bbb-3(b)(1), unless the authorization is terminated or revoked sooner.  Performed at [facility], [address], [place], [place] [address]     Procedures and diagnostic studies:  No results found.             LOS: 4 days   Tinamarie Przybylski  [facility]     11/27/2019, 11:51 AM

## 2019-11-27 NOTE — Plan of Care (Signed)
  Problem: Respiratory: Goal: Will maintain a patent airway 11/27/2019 1016 by Isla Pence, RN Outcome: Progressing 11/27/2019 1012 by Isla Pence, RN Outcome: Progressing Goal: Complications related to the disease process, condition or treatment will be avoided or minimized 11/27/2019 1016 by Isla Pence, RN Outcome: Progressing 11/27/2019 1012 by Isla Pence, RN Outcome: Progressing

## 2019-11-28 ENCOUNTER — Inpatient Hospital Stay (HOSPITAL_COMMUNITY): Payer: BC Managed Care – PPO

## 2019-11-28 DIAGNOSIS — R609 Edema, unspecified: Secondary | ICD-10-CM

## 2019-11-28 DIAGNOSIS — R7989 Other specified abnormal findings of blood chemistry: Secondary | ICD-10-CM

## 2019-11-28 LAB — COMPREHENSIVE METABOLIC PANEL
ALT: 83 U/L — ABNORMAL HIGH (ref 0–44)
AST: 56 U/L — ABNORMAL HIGH (ref 15–41)
Albumin: 2.5 g/dL — ABNORMAL LOW (ref 3.5–5.0)
Alkaline Phosphatase: 89 U/L (ref 38–126)
Anion gap: 13 (ref 5–15)
BUN: 21 mg/dL (ref 8–23)
CO2: 23 mmol/L (ref 22–32)
Calcium: 8.7 mg/dL — ABNORMAL LOW (ref 8.9–10.3)
Chloride: 100 mmol/L (ref 98–111)
Creatinine, Ser: 0.97 mg/dL (ref 0.61–1.24)
GFR calc Af Amer: >60 mL/min (ref >60)
GFR calc non Af Amer: >60 mL/min (ref >60)
Glucose, Bld: 275 mg/dL — ABNORMAL HIGH (ref 70–99)
Potassium: 5.2 mmol/L — ABNORMAL HIGH (ref 3.5–5.1)
Sodium: 136 mmol/L (ref 135–145)
Total Bilirubin: 1.6 mg/dL — ABNORMAL HIGH (ref 0.3–1.2)
Total Protein: 6.9 g/dL (ref 6.5–8.1)

## 2019-11-28 LAB — CBC WITH DIFFERENTIAL/PLATELET
Abs Immature Granulocytes: 0.22 10*3/uL — ABNORMAL HIGH (ref 0.00–0.07)
Basophils Absolute: 0 10*3/uL (ref 0.0–0.1)
Basophils Relative: 0 %
Eosinophils Absolute: 0 10*3/uL (ref 0.0–0.5)
Eosinophils Relative: 0 %
HCT: 46.8 % (ref 39.0–52.0)
Hemoglobin: 15.5 g/dL (ref 13.0–17.0)
Immature Granulocytes: 2 %
Lymphocytes Relative: 9 %
Lymphs Abs: 1.1 10*3/uL (ref 0.7–4.0)
MCH: 31.7 pg (ref 26.0–34.0)
MCHC: 33.1 g/dL (ref 30.0–36.0)
MCV: 95.7 fL (ref 80.0–100.0)
Monocytes Absolute: 0.7 10*3/uL (ref 0.1–1.0)
Monocytes Relative: 6 %
Neutro Abs: 10.1 10*3/uL — ABNORMAL HIGH (ref 1.7–7.7)
Neutrophils Relative %: 83 %
Platelets: 471 10*3/uL — ABNORMAL HIGH (ref 150–400)
RBC: 4.89 MIL/uL (ref 4.22–5.81)
RDW: 13.2 % (ref 11.5–15.5)
WBC: 12.2 10*3/uL — ABNORMAL HIGH (ref 4.0–10.5)
nRBC: 0 % (ref 0.0–0.2)

## 2019-11-28 LAB — MAGNESIUM: Magnesium: 2.2 mg/dL (ref 1.7–2.4)

## 2019-11-28 LAB — GLUCOSE, CAPILLARY
Glucose-Capillary: 246 mg/dL — ABNORMAL HIGH (ref 70–99)
Glucose-Capillary: 229 mg/dL — ABNORMAL HIGH (ref 70–99)
Glucose-Capillary: 353 mg/dL — ABNORMAL HIGH (ref 70–99)
Glucose-Capillary: 331 mg/dL — ABNORMAL HIGH (ref 70–99)

## 2019-11-28 LAB — C-REACTIVE PROTEIN: CRP: 3.5 mg/dL — ABNORMAL HIGH (ref <1.0)

## 2019-11-28 LAB — D-DIMER, QUANTITATIVE: D-Dimer, Quant: 7.01 ug/mL-FEU — ABNORMAL HIGH (ref 0.00–0.50)

## 2019-11-28 LAB — BRAIN NATRIURETIC PEPTIDE: B Natriuretic Peptide: 195 pg/mL — ABNORMAL HIGH (ref 0.0–100.0)

## 2019-11-28 LAB — PROCALCITONIN: Procalcitonin: <0.1 ng/mL

## 2019-11-28 MED ORDER — FUROSEMIDE 40 MG PO TABS
40.0000 mg | ORAL_TABLET | Freq: Once | ORAL | Status: AC
  Administered 2019-11-28: 40 mg via ORAL
  Filled 2019-11-28: qty 1

## 2019-11-28 MED ORDER — METHYLPREDNISOLONE SODIUM SUCC 125 MG IJ SOLR: 60.0000 mg | Freq: Every day | INTRAMUSCULAR | Status: DC

## 2019-11-28 MED ORDER — SODIUM POLYSTYRENE SULFONATE 15 GM/60ML PO SUSP
30.0000 g | Freq: Once | ORAL | Status: AC
  Administered 2019-11-28: 30 g via ORAL
  Filled 2019-11-28: qty 120

## 2019-11-28 MED ORDER — ENOXAPARIN SODIUM 120 MG/0.8ML ~~LOC~~ SOLN
120.0000 mg | Freq: Two times a day (BID) | SUBCUTANEOUS | Status: DC
  Filled 2019-11-28 (×4): qty 0.8

## 2019-11-28 NOTE — Progress Notes (Signed)
Patient in chair watching television in no acute distress. Patient has ambulated to the door and back in his room without distress. Patient pulse oximetry did drop some but not below 89 %.

## 2019-11-28 NOTE — Progress Notes (Signed)
Progress Note    Joshua Howell  CVE:[id number] DOB: 04/13/1961  DOA: 11/23/2019 PCP: Jac Canavan, PA-C      Brief Narrative:    Medical records reviewed and are as summarized below:  Joshua Howell is a 59 y.o. male with PMHx of DM-2, CAD s/p CABG, morbid obesity, HLD-presented to the hospital with shortness of breath.  He was admitted to the hospital for acute hypoxemic respiratory failure secondary to COVID-19 pneumonia.   Assessment/Plan:    Acute hypoxic respiratory failure due to COVID-19 pneumonia related parenchymal lung injury. He had moderate disease, has been started on IV steroids and remdesivir, finishing his course, hypoxia improving. Advance activity. Encouraged to sit up in chair in the daytime use I-S and flutter valve for pulmonary toiletry. Monitor inflammatory markers. Likely discharge soon may require home oxygen.    Recent Labs  Lab 11/23/19 0602 11/23/19 1105 11/23/19 1105 11/24/19 0248 11/25/19 0218 11/26/19 0340 11/27/19 0519 11/28/19 0833 11/28/19 0927  CRP  --  23.5*   < > 22.8* 13.1* 7.9* 4.8* 3.5*  --   DDIMER  --  1.82*   < > 1.88* 2.08* 2.66* 3.58* 7.01*  --   BNP  --   --   --   --   --   --   --   --  195.0*  PROCALCITON  --  0.11  --   --   --   --   --  <0.10  --   SARSCOV2NAA POSITIVE*  --   --   --   --   --   --   --   --    < > = values in this interval not displayed.      CBG (last 3)  Recent Labs    11/27/19 1610 11/27/19 2104 11/28/19 0713  GLUCAP 240* 241* 246*   Lab Results  Component Value Date   HGBA1C 7.6 (H) 09/23/2019    Type 2 diabetes mellitus with hyperglycemia: Steroids contributing to hyperglycemia.  Continue Lantus and NovoLog.  CAD: Continue aspirin and statin  Mild hyponatremia: Gentle Lasix and monitor.  Hyperkalemia. Lasix and Kayexalate.    Body mass index is 37.3 kg/m.  (Morbid obesity): This complicates overall care and prognosis.  Diet Order            Diet heart  healthy/carb modified Room service appropriate? Yes; Fluid consistency: Thin  Diet effective now                  Medications:   . aspirin EC  81 mg Oral Daily  . atorvastatin  80 mg Oral q1800  . enoxaparin (LOVENOX) injection  60 mg Subcutaneous Q24H  . insulin aspart  0-20 Units Subcutaneous TID WC  . insulin aspart  0-5 Units Subcutaneous QHS  . insulin aspart  6 Units Subcutaneous TID WC  . insulin glargine  15 Units Subcutaneous Daily  . [START ON 11/29/2019] methylPREDNISolone (SOLU-MEDROL) injection  60 mg Intravenous Daily  . nystatin  5 mL Oral QID  . sodium chloride flush  3 mL Intravenous Q12H   Continuous Infusions: . sodium chloride       Anti-infectives (From admission, onward)   Start     Dose/Rate Route Frequency Ordered Stop   11/24/19 1000  remdesivir 100 mg in sodium chloride 0.9 % 100 mL IVPB       "Followed by" Linked Group Details   100 mg 200 mL/hr over 30  Minutes Intravenous Daily 11/23/19 0920 11/27/19 1759   11/23/19 1000  remdesivir 200 mg in sodium chloride 0.9% 250 mL IVPB       "Followed by" Linked Group Details   200 mg 580 mL/hr over 30 Minutes Intravenous Once 11/23/19 0920 11/23/19 1126        Family Communication/Anticipated D/C date and plan/Code Status   DVT prophylaxis: Lovenox     Code Status: Full Code  Family Communication: Plan discussed with patient Disposition Plan:    Status is: Inpatient  Remains inpatient appropriate because:IV treatments appropriate due to intensity of illness or inability to take PO and Inpatient level of care appropriate due to severity of illness   Dispo: The patient is from: Home              Anticipated d/c is to: Home              Anticipated d/c date is: 3 days              Patient currently is not medically stable to d/c.  Subjective:   Patient in bed, appears comfortable, denies any headache, no fever, no chest pain or pressure, no shortness of breath , no abdominal pain. No focal  weakness.   Objective:    Vitals:   11/27/19 2100 11/28/19 0504 11/28/19 0549 11/28/19 0800  BP: 119/63 111/71 116/61 (!) 130/59  Pulse: 69 (!) 53 (!) 43   Resp: 18 19 17    Temp: 97.7 F (36.5 C) 97.8 F (36.6 C) 97.7 F (36.5 C) 97.7 F (36.5 C)  TempSrc: Oral Oral Oral Oral  SpO2: 90% 94% 100% 90%  Weight:      Height:       No data found.   Intake/Output Summary (Last 24 hours) at 11/28/2019 1131 Last data filed at 11/28/2019 0811 Gross per 24 hour  Intake 480 ml  Output --  Net 480 ml   Filed Weights   11/23/19 0322  Weight: 124.7 kg    Exam:  Awake Alert, No new F.N deficits, Normal affect [place].AT,PERRAL Supple Neck,No JVD, No cervical lymphadenopathy appriciated.  Symmetrical Chest wall movement, Good air movement bilaterally, CTAB RRR,No Gallops, Rubs or new Murmurs, No Parasternal Heave +ve B.Sounds, Abd Soft, No tenderness, No organomegaly appriciated, No rebound - guarding or rigidity. No Cyanosis, Clubbing or edema, No new Rash or bruise    Data Reviewed:   I have personally reviewed following labs and imaging studies:  Labs:  Recent Labs  Lab 11/24/19 0248 11/25/19 0218 11/26/19 0340 11/27/19 0519 11/28/19 0833  WBC 6.6 9.4 10.1 9.2 12.2*  HGB 13.8 13.3 13.4 13.3 15.5  HCT 41.1 38.8* 40.0 40.3 46.8  PLT 269 333 377 394 471*  MCV 94.9 91.5 93.2 95.5 95.7  MCH 31.9 31.4 31.2 31.5 31.7  MCHC 33.6 34.3 33.5 33.0 33.1  RDW 12.7 12.6 12.9 13.1 13.2  LYMPHSABS 0.5* 0.5* 0.5* 0.5* 1.1  MONOABS 0.2 0.3 0.4 0.4 0.7  EOSABS 0.0 0.0 0.0 0.0 0.0  BASOSABS 0.0 0.0 0.0 0.0 0.0    Recent Labs  Lab 11/23/19 0320 11/23/19 1105 11/24/19 0248 11/25/19 0218 11/26/19 0340 11/27/19 0519 11/28/19 0833 11/28/19 0927  NA   < >  --  132* 133* 135 136 136  --   K   < >  --  4.3 4.8 4.7 5.1 5.2*  --   CL   < >  --  99 99 100 103 100  --  CO2   < >  --  22 23 24 23 23   --   GLUCOSE   < >  --  250* 233* 223* 215* 275*  --   BUN   < >  --  18 24* 22  20 21   --   CREATININE   < >  --  0.92 0.92 1.02 0.91 0.97  --   CALCIUM   < >  --  8.3* 8.5* 8.3* 8.2* 8.7*  --   AST   < >  --  129* 120* 89* 69* 56*  --   ALT   < >  --  104* 117* 105* 90* 83*  --   ALKPHOS   < >  --  79 76 73 67 89  --   BILITOT   < >  --  1.4* 1.5* 0.7 0.8 1.6*  --   ALBUMIN   < >  --  2.3* 2.4* 2.3* 2.2* 2.5*  --   MG  --   --  2.1  --   --   --  2.2  --   CRP   < > 23.5* 22.8* 13.1* 7.9* 4.8* 3.5*  --   DDIMER   < > 1.82* 1.88* 2.08* 2.66* 3.58* 7.01*  --   PROCALCITON  --  0.11  --   --   --   --  <0.10  --   BNP  --   --   --   --   --   --   --  195.0*   < > = values in this interval not displayed.      Microbiology Recent Results (from the past 240 hour(s))  SARS Coronavirus 2 by RT PCR (hospital order, performed in [facility] hospital lab) Nasopharyngeal Nasopharyngeal Swab     Status: Abnormal   Collection Time: 11/23/19  6:02 AM   Specimen: Nasopharyngeal Swab  Result Value Ref Range Status   SARS Coronavirus 2 POSITIVE (A) NEGATIVE Final    Comment: RESULT CALLED TO, READ BACK BY AND VERIFIED WITH: RN LESLIE SCHULAR 0725 11/23/2019 KB (NOTE) SARS-CoV-2 target nucleic acids are DETECTED  SARS-CoV-2 RNA is generally detectable in upper respiratory specimens  during the acute phase of infection.  Positive results are indicative  of the presence of the identified virus, but do not rule out bacterial infection or co-infection with other pathogens not detected by the test.  Clinical correlation with patient history and  other diagnostic information is necessary to determine patient infection status.  The expected result is negative.  Fact Sheet for Patients:   01/24/20   Fact Sheet for Healthcare Providers:   01/24/2020    This test is not yet approved or cleared by the [employer] FDA and  has been authorized for detection and/or diagnosis of SARS-CoV-2 by FDA under an Emergency  Use Authorization (EUA).  This EUA will remain in effect (meaning this  test can be used) for the duration of  the COVID-19 declaration under Section 564(b)(1) of the Act, 21 U.S.C. section 360-bbb-3(b)(1), unless the authorization is terminated or revoked sooner.  Performed at [facility], [address] [place], [address] [place]     Procedures and diagnostic studies:  DG Chest Port 1 View  Result Date: 11/28/2019 CLINICAL DATA:  COVID-19 positive patient. EXAM: PORTABLE CHEST 1 VIEW COMPARISON:  PA and lateral chest and 03/04/2017 and 11/23/2019 FINDINGS: Bilateral mid and lower lung zone airspace disease with a peripheral  predominance is again seen and has worsened on the right. No pneumothorax or pleural fluid. Heart size is upper normal. The patient is status post CABG. Atherosclerosis noted. IMPRESSION: Bilateral airspace disease consistent with pneumonia has worsened on the right since the most recent study. Aortic Atherosclerosis (ICD10-I70.0). Electronically Signed   By: Drusilla Kannerhomas  Dalessio M.D.   On: 11/28/2019 09:46    Signature  Susa RaringPrashant Nasim Habeeb M.D on 11/28/2019 at 11:31 AM   -  To page go to [email or web address]

## 2019-11-28 NOTE — Progress Notes (Addendum)
Patient dropped down to 30s, non-sustained. Filed by CCMD, which fired patient yellow. Retook vital signs. Patient back in the green.    11/28/19 0544  Assess: MEWS Score  ECG Heart Rate (!) 39  Assess: MEWS Score  MEWS Temp 0  MEWS Systolic 0  MEWS Pulse 2  MEWS RR 0  MEWS LOC 0  MEWS Score 2  MEWS Score Color Yellow  Assess: if the MEWS score is Yellow or Red  Were vital signs taken at a resting state? No (verified by CCMD)  Focused Assessment Documented focused assessment  Early Detection of Sepsis Score *See Row Information* Low  MEWS guidelines implemented *See Row Information* No, vital signs rechecked  Document  Patient Outcome Other (Comment) (no interventions, vital signs rechecked. )

## 2019-11-28 NOTE — Progress Notes (Signed)
ANTICOAGULATION CONSULT NOTE - Initial Consult  Pharmacy Consult:  Lovenox Indication:  Rule out VTE  No Known Allergies  Patient Measurements: Height: 6' (182.9 cm) Weight: 124.7 kg (275 lb) IBW/kg (Calculated) : 77.6  Vital Signs: Temp: 97.7 F (36.5 C) (07/17 0800) Temp Source: Oral (07/17 0800) BP: 130/59 (07/17 0800) Pulse Rate: 43 (07/17 0549)  Labs: Recent Labs    11/26/19 0340 11/26/19 0340 11/27/19 0519 11/28/19 0833  HGB 13.4   < > 13.3 15.5  HCT 40.0  --  40.3 46.8  PLT 377  --  394 471*  CREATININE 1.02  --  0.91 0.97   < > = values in this interval not displayed.    Estimated Creatinine Clearance: 109 mL/min (by C-G formula based on SCr of 0.97 mg/dL).   Medical History: Past Medical History:  Diagnosis Date  . CAD (coronary artery disease)    a. NSTEMI 01/2017 s/p CABG.  . Carotid arterial disease (HCC)    a. mild 1-39% 01/2017.  Marland Kitchen Dyslipidemia   . Migraine    "only in [employer]" (01/23/2017)  . Morbid obesity (HCC)   . NSTEMI (non-ST elevated myocardial infarction) (HCC) 01/23/2017  . Type I diabetes mellitus (HCC) 07/2016     Assessment: 59 YOM presented with SOB and fever, found to have Covid-19.  Patient has been on prophylactic Lovenox, now with trending up d-dimer so Pharmacy has been consulted to increase Lovenox to full dosing.  Renal function and CBC stable.  Goal of Therapy:  Anti-Xa level 0.6-1 units/ml 4hrs after LMWH dose given Monitor platelets by anticoagulation protocol: Yes   Plan:  Lovenox 120mg  SQ Q12H CBC Q72H while on Lovenox  Nupur Hohman D. 06-20-1984, PharmD, BCPS, BCCCP 11/28/2019, 12:18 PM

## 2019-11-28 NOTE — Progress Notes (Signed)
Lower extremity venous bilateral study completed.   See Cv Proc for preliminary results.   Lindsy Cerullo  

## 2019-11-28 NOTE — Progress Notes (Signed)
   11/28/19 0549  Assess: MEWS Score  Temp 97.7 F (36.5 C)  BP 116/61  Pulse Rate (!) 43  ECG Heart Rate (!) 42  Resp 17  Level of Consciousness Alert  SpO2 100 %  Assess: MEWS Score  MEWS Temp 0  MEWS Systolic 0  MEWS Pulse 1  MEWS RR 0  MEWS LOC 0  MEWS Score 1  MEWS Score Color Green

## 2019-11-28 NOTE — Progress Notes (Signed)
Patient sitting upright in chair in no acute distress. Patient has ambulated a few times across the room for staff and on his own.

## 2019-11-29 LAB — BRAIN NATRIURETIC PEPTIDE: B Natriuretic Peptide: 105.8 pg/mL — ABNORMAL HIGH (ref 0.0–100.0)

## 2019-11-29 LAB — POTASSIUM: Potassium: 4.7 mmol/L (ref 3.5–5.1)

## 2019-11-29 LAB — PROCALCITONIN: Procalcitonin: <0.1 ng/mL

## 2019-11-29 LAB — GLUCOSE, CAPILLARY
Glucose-Capillary: 101 mg/dL — ABNORMAL HIGH (ref 70–99)
Glucose-Capillary: 144 mg/dL — ABNORMAL HIGH (ref 70–99)

## 2019-11-29 LAB — MAGNESIUM: Magnesium: 2.3 mg/dL (ref 1.7–2.4)

## 2019-11-29 MED ORDER — METHYLPREDNISOLONE SODIUM SUCC 40 MG IJ SOLR
40.0000 mg | Freq: Every day | INTRAMUSCULAR | Status: DC
  Administered 2019-11-29: 40 mg via INTRAVENOUS
  Filled 2019-11-29: qty 1

## 2019-11-29 MED ORDER — PANTOPRAZOLE SODIUM 40 MG PO TBEC
40.0000 mg | DELAYED_RELEASE_TABLET | Freq: Every day | ORAL | 0 refills | Status: DC
Start: 2019-11-29 — End: 2020-04-20

## 2019-11-29 MED ORDER — ASPIRIN EC 81 MG PO TBEC: 81.0000 mg | DELAYED_RELEASE_TABLET | Freq: Every day | ORAL | 3 refills | Status: DC

## 2019-11-29 MED ORDER — APIXABAN 2.5 MG PO TABS
2.5000 mg | ORAL_TABLET | Freq: Two times a day (BID) | ORAL | 0 refills | Status: DC
Start: 2019-11-29 — End: 2020-04-20

## 2019-11-29 MED ORDER — ALBUTEROL SULFATE HFA 108 (90 BASE) MCG/ACT IN AERS: 2.0000 | INHALATION_SPRAY | Freq: Four times a day (QID) | RESPIRATORY_TRACT | 0 refills | Status: DC | PRN

## 2019-11-29 NOTE — Discharge Instructions (Signed)
Follow with Primary MD Tysinger, Kermit Balo, PA-C in 3 days   Get CBC, CMP, 2 view Chest X ray -  checked next visit within 3 days by Primary MD    Activity: As tolerated with Full fall precautions use walker/cane & assistance as needed  Disposition Home   Diet: Heart Healthy  Low carb  Special Instructions: If you have smoked or chewed Tobacco  in the last 2 yrs please stop smoking, stop any regular Alcohol  and or any Recreational drug use.  On your next visit with your primary care physician please Get Medicines reviewed and adjusted.  Please request your Prim.MD to go over all Hospital Tests and Procedure/Radiological results at the follow up, please get all Hospital records sent to your Prim MD by signing hospital release before you go home.  If you experience worsening of your admission symptoms, develop shortness of breath, life threatening emergency, suicidal or homicidal thoughts you must seek medical attention immediately by calling 911 or calling your MD immediately  if symptoms less severe.  You Must read complete instructions/literature along with all the possible adverse reactions/side effects for all the Medicines you take and that have been prescribed to you. Take any new Medicines after you have completely understood and accpet all the possible adverse reactions/side effects.         Person Under Monitoring Name: Joshua Howell  Location: [address] [place] [address]   Infection Prevention Recommendations for Individuals Confirmed to have, or Being Evaluated for, 2019 Novel Coronavirus (COVID-19) Infection Who Receive Care at Home  Individuals who are confirmed to have, or are being evaluated for, COVID-19 should follow the prevention steps below until a healthcare provider or local or state health department says they can return to normal activities.  Stay home except to get medical care You should restrict activities outside your home, except for  getting medical care. Do not go to work, school, or public areas, and do not use public transportation or taxis.  Call ahead before visiting your doctor Before your medical appointment, call the healthcare provider and tell them that you have, or are being evaluated for, COVID-19 infection. This will help the healthcare provider's office take steps to keep other people from getting infected. Ask your healthcare provider to call the local or state health department.  Monitor your symptoms Seek prompt medical attention if your illness is worsening (e.g., difficulty breathing). Before going to your medical appointment, call the healthcare provider and tell them that you have, or are being evaluated for, COVID-19 infection. Ask your healthcare provider to call the local or state health department.  Wear a facemask You should wear a facemask that covers your nose and mouth when you are in the same room with other people and when you visit a healthcare provider. People who live with or visit you should also wear a facemask while they are in the same room with you.  Separate yourself from other people in your home As much as possible, you should stay in a different room from other people in your home. Also, you should use a separate bathroom, if available.  Avoid sharing household items You should not share dishes, drinking glasses, cups, eating utensils, towels, bedding, or other items with other people in your home. After using these items, you should wash them thoroughly with soap and water.  Cover your coughs and sneezes Cover your mouth and nose with a tissue when you cough or sneeze, or you  can cough or sneeze into your sleeve. Throw used tissues in a lined trash can, and immediately wash your hands with soap and water for at least 20 seconds or use an alcohol-based hand rub.  Wash your [facility] your hands often and thoroughly with soap and water for at least 20 seconds. You can use  an alcohol-based hand sanitizer if soap and water are not available and if your hands are not visibly dirty. Avoid touching your eyes, nose, and mouth with unwashed hands.   Prevention Steps for Caregivers and Household Members of Individuals Confirmed to have, or Being Evaluated for, COVID-19 Infection Being Cared for in the Home  If you live with, or provide care at home for, a person confirmed to have, or being evaluated for, COVID-19 infection please follow these guidelines to prevent infection:  Follow healthcare provider's instructions Make sure that you understand and can help the patient follow any healthcare provider instructions for all care.  Provide for the patient's basic needs You should help the patient with basic needs in the home and provide support for getting groceries, prescriptions, and other personal needs.  Monitor the patient's symptoms If they are getting sicker, call his or her medical provider and tell them that the patient has, or is being evaluated for, COVID-19 infection. This will help the healthcare provider's office take steps to keep other people from getting infected. Ask the healthcare provider to call the local or state health department.  Limit the number of people who have contact with the patient  If possible, have only one caregiver for the patient.  Other household members should stay in another home or place of residence. If this is not possible, they should stay  in another room, or be separated from the patient as much as possible. Use a separate bathroom, if available.  Restrict visitors who do not have an essential need to be in the home.  Keep older adults, very young children, and other sick people away from the patient Keep older adults, very young children, and those who have compromised immune systems or chronic health conditions away from the patient. This includes people with chronic heart, lung, or kidney conditions, diabetes,  and cancer.  Ensure good ventilation Make sure that shared spaces in the home have good air flow, such as from an air conditioner or an opened window, weather permitting.  Wash your hands often  Wash your hands often and thoroughly with soap and water for at least 20 seconds. You can use an alcohol based hand sanitizer if soap and water are not available and if your hands are not visibly dirty.  Avoid touching your eyes, nose, and mouth with unwashed hands.  Use disposable paper towels to dry your hands. If not available, use dedicated cloth towels and replace them when they become wet.  Wear a facemask and gloves  Wear a disposable facemask at all times in the room and gloves when you touch or have contact with the patient's blood, body fluids, and/or secretions or excretions, such as sweat, saliva, sputum, nasal mucus, vomit, urine, or feces.  Ensure the mask fits over your nose and mouth tightly, and do not touch it during use.  Throw out disposable facemasks and gloves after using them. Do not reuse.  Wash your hands immediately after removing your facemask and gloves.  If your personal clothing becomes contaminated, carefully remove clothing and launder. Wash your hands after handling contaminated clothing.  Place all used disposable facemasks, gloves,  and other waste in a lined container before disposing them with other household waste.  Remove gloves and wash your hands immediately after handling these items.  Do not share dishes, glasses, or other household items with the patient  Avoid sharing household items. You should not share dishes, drinking glasses, cups, eating utensils, towels, bedding, or other items with a patient who is confirmed to have, or being evaluated for, COVID-19 infection.  After the person uses these items, you should wash them thoroughly with soap and water.  Wash laundry thoroughly  Immediately remove and wash clothes or bedding that have blood,  body fluids, and/or secretions or excretions, such as sweat, saliva, sputum, nasal mucus, vomit, urine, or feces, on them.  Wear gloves when handling laundry from the patient.  Read and follow directions on labels of laundry or clothing items and detergent. In general, wash and dry with the warmest temperatures recommended on the label.  Clean all areas the individual has used often  Clean all touchable surfaces, such as counters, tabletops, doorknobs, bathroom fixtures, toilets, phones, keyboards, tablets, and bedside tables, every day. Also, clean any surfaces that may have blood, body fluids, and/or secretions or excretions on them.  Wear gloves when cleaning surfaces the patient has come in contact with.  Use a diluted bleach solution (e.g., dilute bleach with 1 part bleach and 10 parts water) or a household disinfectant with a label that says EPA-registered for coronaviruses. To make a bleach solution at home, add 1 tablespoon of bleach to 1 quart (4 cups) of water. For a larger supply, add  cup of bleach to 1 gallon (16 cups) of water.  Read labels of cleaning products and follow recommendations provided on product labels. Labels contain instructions for safe and effective use of the cleaning product including precautions you should take when applying the product, such as wearing gloves or eye protection and making sure you have good ventilation during use of the product.  Remove gloves and wash hands immediately after cleaning.  Monitor yourself for signs and symptoms of illness Caregivers and household members are considered close contacts, should monitor their health, and will be asked to limit movement outside of the home to the extent possible. Follow the monitoring steps for close contacts listed on the symptom monitoring form.   ? If you have additional questions, contact your local health department or call the epidemiologist on call at [phone] (available 24/7). ? This  guidance is subject to change. For the most up-to-date guidance from [facility] please refer to their website: [email or web address]hu

## 2019-11-29 NOTE — Discharge Summary (Signed)
Joshua Howell [id number]:[id number] DOB: Jan 27, 1961 DOA: 11/23/2019  PCP: Carlena Hurl, PA-C  Admit date: 11/23/2019  Discharge date: 11/29/2019  Admitted From: Home   Disposition:  Home   Recommendations for Outpatient Follow-up:   Follow up with PCP in 1-2 weeks  PCP Please obtain BMP/CBC, 2 view CXR in 1week,  (see Discharge instructions)   PCP Please follow up on the following pending results: Repeat CBC, CMP, 2 view chest x-ray in a week.   Home Health: None   Equipment/Devices: 2 lit [place] o2  Consultations: None Discharge Condition: Stable    CODE STATUS: Full    Diet Recommendation: Heart Healthy Low carb  Diet Order            Diet heart healthy/carb modified Room service appropriate? Yes; Fluid consistency: Thin  Diet effective now                  Chief Complaint  Patient presents with  . Shortness of Breath  . Fever     Brief history of present illness from the day of admission and additional interim summary    Joshua Howell is a 59 y.o. male with PMHx ofDM-2, CAD s/p CABG, morbid obesity, HLD-presented to the hospital with shortness of breath.  He was admitted to the hospital for acute hypoxemic respiratory failure secondary to COVID-19 pneumonia.                                                                  Hospital Course   Acute hypoxic respiratory failure due to COVID-19 pneumonia related parenchymal lung injury. He had moderate disease, was treated with IV steroids and remdesivir, has finished his course of treatment, currently on 1 L nasal cannula oxygen at rest and 2 L on ambulation.  Will be discharged on home oxygen along with rescue inhaler, follow with PCP in a week.  SpO2: 91 % O2 Flow Rate (L/min): 1 L/min  Recent Labs  Lab 11/23/19 0602 11/23/19 1105  11/23/19 1105 11/24/19 0248 11/25/19 0218 11/26/19 0340 11/27/19 0519 11/28/19 0833 11/28/19 0927 11/29/19 0206  CRP  --  23.5*   < > 22.8* 13.1* 7.9* 4.8* 3.5*  --   --   DDIMER  --  1.82*   < > 1.88* 2.08* 2.66* 3.58* 7.01*  --   --   FERRITIN  --  1,425*  --  1,796* 1,927* 1,442* 832*  --   --   --   BNP  --   --   --   --   --   --   --   --  195.0* 105.8*  PROCALCITON  --  0.11  --   --   --   --   --  <0.10  --  <0.10  SARSCOV2NAA POSITIVE*  --   --   --   --   --   --   --   --   --    < > = values in this interval not displayed.    Hepatic Function Latest Ref Rng & Units 11/28/2019 11/27/2019 11/26/2019  Total Protein 6.5 - 8.1 g/dL 6.9 1.5(J) 6.5  Albumin 3.5 - 5.0 g/dL 2.5(L) 2.2(L) 2.3(L)  AST 15 - 41 U/L 56(H) 69(H) 89(H)  ALT 0 - 44 U/L 83(H) 90(H) 105(H)  Alk Phosphatase 38 - 126 U/L 89 67 73  Total Bilirubin 0.3 - 1.2 mg/dL 1.6(E) 0.8 0.7  Bilirubin, Direct 0.0 - 0.2 mg/dL - - -    Type 2 diabetes mellitus with hyperglycemia due to steroids.  Upon discharge continue home regimen and follow with PCP.    Lab Results  Component Value Date   HGBA1C 7.6 (H) 09/23/2019    Extremely high and rising D-dimer in the setting of COVID-19 related inflammation.  Placing him at high risk for developing a DVT.  Leg ultrasound negative here, 2 weeks of Eliquis at prophylactic dose along with PPI.  CAD: Continue statin, while on Eliquis for 2 weeks Aspirin.  Mild hyponatremia: Gentle Lasix and monitor.  Hyperkalemia.  Received Lasix and Kayexalate.  Will repeat potassium levels prior to discharge and then have PCP repeat in 3 to 4 days.  Body mass index is 37.3 kg/m.  (Morbid obesity): This complicates overall care and prognosis.   Discharge diagnosis     Principal Problem:   Acute hypoxemic respiratory failure due to COVID-19 [facility]) Active Problems:   Uncontrolled type 2 diabetes mellitus with complication, without long-term current use of insulin (HCC)   Obesity,  Class II, BMI 35-39.9   Mixed dyslipidemia   S/P CABG x 4    Discharge instructions    Discharge Instructions    Discharge instructions   Complete by: As directed    Follow with Primary MD Tysinger, Kermit Balo, PA-C in 3 days   Get CBC, CMP, 2 view Chest X ray -  checked next visit within 3 days by Primary MD    Activity: As tolerated with Full fall precautions use walker/cane & assistance as needed  Disposition Home   Diet: Heart Healthy  Low carb  Special Instructions: If you have smoked or chewed Tobacco  in the last 2 yrs please stop smoking, stop any regular Alcohol  and or any Recreational drug use.  On your next visit with your primary care physician please Get Medicines reviewed and adjusted.  Please request your Prim.MD to go over all Hospital Tests and Procedure/Radiological results at the follow up, please get all Hospital records sent to your Prim MD by signing hospital release before you go home.  If you experience worsening of your admission symptoms, develop shortness of breath, life threatening emergency, suicidal or homicidal thoughts you must seek medical attention immediately by calling 911 or calling your MD immediately  if symptoms less severe.  You Must read complete instructions/literature along with all the possible adverse reactions/side effects for all the Medicines you take and that have been prescribed to you. Take any new Medicines after you have completely understood and accpet all the possible adverse reactions/side effects.   Increase activity slowly   Complete by: As directed    MyChart COVID-19 home monitoring program   Complete by: Nov 29, 2019    Is the patient willing to use the [employer] App for  home monitoring?: Yes   Temperature monitoring   Complete by: Nov 29, 2019    After how many days would you like to receive a notification of this patient's flowsheet entries?: 1      Discharge Medications   Allergies as of 11/29/2019   No  Known Allergies     Medication List    TAKE these medications   albuterol 108 (90 Base) MCG/ACT inhaler Commonly known as: VENTOLIN HFA Inhale 2 puffs into the lungs every 6 (six) hours as needed for wheezing or shortness of breath.   apixaban 2.5 MG Tabs tablet Commonly known as: Eliquis Take 1 tablet (2.5 mg total) by mouth 2 (two) times daily.   aspirin EC 81 MG tablet Take 1 tablet (81 mg total) by mouth daily. Start taking on: December 13, 2019 What changed: These instructions start on December 13, 2019. If you are unsure what to do until then, ask your doctor or other care provider.   atorvastatin 80 MG tablet Commonly known as: LIPITOR TAKE 1 TABLET BY MOUTH  DAILY AT 6 PM What changed:   how much to take  how to take this  when to take this  additional instructions   metFORMIN 1000 MG tablet Commonly known as: GLUCOPHAGE Take 1 tablet (1,000 mg total) by mouth 2 (two) times daily with a meal.   pantoprazole 40 MG tablet Commonly known as: Protonix Take 1 tablet (40 mg total) by mouth daily.            Durable Medical Equipment  (From admission, onward)         Start     Ordered   11/29/19 0912  For home use only DME oxygen  Once       Question Answer Comment  Length of Need 6 Months   Mode or (Route) Nasal cannula   Liters per Minute 2   Frequency Continuous (stationary and portable oxygen unit needed)   Oxygen delivery system Gas      11/29/19 0912           Follow-up Information    Tysinger, Camelia Eng, PA-C. Schedule an appointment as soon as possible for a visit in 1 week(s).   Specialty: Family Medicine Contact information: [place] [address] [phone]        Dorothy Spark, MD. Schedule an appointment as soon as possible for a visit in 2 week(s).   Specialty: Cardiology Contact information: [place] [address] [phone]               Major procedures and  Radiology Reports - PLEASE review detailed and final reports thoroughly  -       DG Chest 2 View  Result Date: 11/23/2019 CLINICAL DATA:  59 year old male with shortness of breath EXAM: CHEST - 2 VIEW COMPARISON:  Chest radiograph dated 03/04/2017. FINDINGS: Bibasilar and bilateral peripheral and subpleural streaky densities may represent atelectatic changes or atypical infection. Clinical correlation is recommended. No lobar consolidation, pleural effusion, or pneumothorax. The cardiac silhouette is within limits. Median sternotomy wires and CABG vascular clips. No acute osseous pathology. IMPRESSION: Bibasilar and subpleural streaky densities may represent atelectatic changes or atypical infection. Electronically Signed   By: Anner Crete M.D.   On: 11/23/2019 03:36   DG Chest Port 1 View  Result Date: 11/28/2019 CLINICAL DATA:  COVID-19 positive patient. EXAM: PORTABLE CHEST 1 VIEW COMPARISON:  PA and lateral chest and 03/04/2017 and 11/23/2019  FINDINGS: Bilateral mid and lower lung zone airspace disease with a peripheral predominance is again seen and has worsened on the right. No pneumothorax or pleural fluid. Heart size is upper normal. The patient is status post CABG. Atherosclerosis noted. IMPRESSION: Bilateral airspace disease consistent with pneumonia has worsened on the right since the most recent study. Aortic Atherosclerosis (ICD10-I70.0). Electronically Signed   By: Inge Rise M.D.   On: 11/28/2019 09:46   VAS [place] LOWER EXTREMITY VENOUS (DVT)  Result Date: 11/28/2019  Lower Venous DVTStudy Indications: Edema, and elevated D-dimer.  Comparison Study: No prior studies. Performing Technologist: Darlin Coco  Examination Guidelines: A complete evaluation includes B-mode imaging, spectral Doppler, color Doppler, and power Doppler as needed of all accessible portions of each vessel. Bilateral testing is considered an integral part of a complete examination. Limited examinations for  reoccurring indications may be performed as noted. The reflux portion of the exam is performed with the patient in reverse Trendelenburg.  +--------+---------------+---------+-----------+----------+--------------------+ RIGHT   CompressibilityPhasicitySpontaneityPropertiesThrombus Aging       +--------+---------------+---------+-----------+----------+--------------------+ CFV     Full           Yes      Yes                                       +--------+---------------+---------+-----------+----------+--------------------+ SFJ     Full                                                              +--------+---------------+---------+-----------+----------+--------------------+ FV Prox Full                                                              +--------+---------------+---------+-----------+----------+--------------------+ FV Mid  Full                                         Duplicated-both                                                           patent               +--------+---------------+---------+-----------+----------+--------------------+ FV      Full                                                              Distal                                                                    +--------+---------------+---------+-----------+----------+--------------------+  PFV     Full                                                              +--------+---------------+---------+-----------+----------+--------------------+ POP     Full           Yes      Yes                                       +--------+---------------+---------+-----------+----------+--------------------+ PTV     Full                                                              +--------+---------------+---------+-----------+----------+--------------------+ PERO    Full                                                               +--------+---------------+---------+-----------+----------+--------------------+   +---------+---------------+---------+-----------+----------+--------------+ LEFT     CompressibilityPhasicitySpontaneityPropertiesThrombus Aging +---------+---------------+---------+-----------+----------+--------------+ CFV      Full           Yes      Yes                                 +---------+---------------+---------+-----------+----------+--------------+ SFJ      Full                                                        +---------+---------------+---------+-----------+----------+--------------+ FV Prox  Full                                                        +---------+---------------+---------+-----------+----------+--------------+ FV Mid   Full                                                        +---------+---------------+---------+-----------+----------+--------------+ FV DistalFull                                                        +---------+---------------+---------+-----------+----------+--------------+ PFV      Full                                                        +---------+---------------+---------+-----------+----------+--------------+  POP      Full           Yes      Yes                                 +---------+---------------+---------+-----------+----------+--------------+ PTV      Full                                                        +---------+---------------+---------+-----------+----------+--------------+ PERO     Full                                                        +---------+---------------+---------+-----------+----------+--------------+     Summary: RIGHT: - There is no evidence of deep vein thrombosis in the lower extremity.  - No cystic structure found in the popliteal fossa.  LEFT: - There is no evidence of deep vein thrombosis in the lower extremity.  - No cystic structure found in the popliteal  fossa.  *See table(s) above for measurements and observations.    Preliminary     Micro Results     Recent Results (from the past 240 hour(s))  SARS Coronavirus 2 by RT PCR (hospital order, performed in [facility] hospital lab) Nasopharyngeal Nasopharyngeal Swab     Status: Abnormal   Collection Time: 11/23/19  6:02 AM   Specimen: Nasopharyngeal Swab  Result Value Ref Range Status   SARS Coronavirus 2 POSITIVE (A) NEGATIVE Final    Comment: RESULT CALLED TO, READ BACK BY AND VERIFIED WITH: RN LESLIE SCHULAR [id number] 11/23/2019 KB (NOTE) SARS-CoV-2 target nucleic acids are DETECTED  SARS-CoV-2 RNA is generally detectable in upper respiratory specimens  during the acute phase of infection.  Positive results are indicative  of the presence of the identified virus, but do not rule out bacterial infection or co-infection with other pathogens not detected by the test.  Clinical correlation with patient history and  other diagnostic information is necessary to determine patient infection status.  The expected result is negative.  Fact Sheet for Patients:   StrictlyIdeas.no   Fact Sheet for Healthcare Providers:   BankingDealers.co.za    This test is not yet approved or cleared by the [place] FDA and  has been authorized for detection and/or diagnosis of SARS-CoV-2 by FDA under an Emergency Use Authorization (EUA).  This EUA will remain in effect (meaning this  test can be used) for the duration of  the COVID-19 declaration under Section 564(b)(1) of the Act, 21 U.S.C. section 360-bbb-3(b)(1), unless the authorization is terminated or revoked sooner.  Performed at [facility], [place] [address] [place],  [address]     Today   Subjective    Joshua Howell today has no headache,no chest abdominal pain,no new weakness tingling or numbness, feels much better wants to go home today.     Objective   Blood pressure  115/70, pulse 80, temperature 98.7 F (37.1 C), temperature source Axillary, resp. rate 12, height 6' (1.829 m), weight 124.7 kg, SpO2 91 %.   Intake/Output Summary (Last 24 hours) at 11/29/2019 0944 Last  data filed at 11/29/2019 0908 Gross per 24 hour  Intake 240 ml  Output --  Net 240 ml    Exam  Awake Alert, No new F.N deficits, Normal affect [place].AT,PERRAL Supple Neck,No JVD, No cervical lymphadenopathy appriciated.  Symmetrical Chest wall movement, Good air movement bilaterally, CTAB RRR,No Gallops,Rubs or new Murmurs, No Parasternal Heave +ve B.Sounds, Abd Soft, Non tender, No organomegaly appriciated, No rebound -guarding or rigidity. No Cyanosis, Clubbing or edema, No new Rash or bruise   Data Review   CBC w Diff:  Lab Results  Component Value Date   WBC 12.2 (H) 11/28/2019   HGB 15.5 11/28/2019   HGB 16.1 09/23/2019   HCT 46.8 11/28/2019   HCT 47.8 09/23/2019   PLT 471 (H) 11/28/2019   PLT 181 09/23/2019   LYMPHOPCT 9 11/28/2019   MONOPCT 6 11/28/2019   EOSPCT 0 11/28/2019   BASOPCT 0 11/28/2019    CMP:  Lab Results  Component Value Date   NA 136 11/28/2019   NA 139 09/23/2019   K 5.2 (H) 11/28/2019   CL 100 11/28/2019   CO2 23 11/28/2019   BUN 21 11/28/2019   BUN 16 09/23/2019   CREATININE 0.97 11/28/2019   CREATININE 1.08 06/06/2016   PROT 6.9 11/28/2019   PROT 7.9 09/23/2019   ALBUMIN 2.5 (L) 11/28/2019   ALBUMIN 4.7 09/23/2019   BILITOT 1.6 (H) 11/28/2019   BILITOT 1.4 (H) 09/23/2019   ALKPHOS 89 11/28/2019   AST 56 (H) 11/28/2019   ALT 83 (H) 11/28/2019  . Lab Results  Component Value Date   HGBA1C 7.6 (H) 09/23/2019     Total Time in preparing paper work, data evaluation and todays exam - 57 minutes  Lala Lund M.D on 11/29/2019 at 9:44 AM  [facility]   Office  [phone]

## 2019-11-29 NOTE — TOC Transition Note (Signed)
Transition of Care [facility]) - CM/SW Discharge Note   Patient Details  Name: Joshua Howell MRN: [id number] Date of Birth: 11/30/1960  Transition of Care [facility]) CM/SW Contact:  Lawerance Sabal, RN Phone Number: 11/29/2019, 8:37 AM   Clinical Narrative:     30 day Eliquis coupon left for patient on COVID area drop off cart on 5W with room number and instructions for use. Called patient on room number and cell number and could not reach him. Secure messaged RN of above and requested RN give Eliquis coupon to patient and make sure he understands how to use.     Final next level of care: Home/Self Care Barriers to Discharge: No Barriers Identified   Patient Goals and CMS Choice        Discharge Placement                       Discharge Plan and Services                                     Social Determinants of Health (SDOH) Interventions     Readmission Risk Interventions No flowsheet data found.

## 2019-11-30 DIAGNOSIS — U071 COVID-19: Secondary | ICD-10-CM | POA: Diagnosis not present

## 2019-11-30 DIAGNOSIS — J9601 Acute respiratory failure with hypoxia: Secondary | ICD-10-CM | POA: Diagnosis not present

## 2019-11-30 NOTE — Care Management (Signed)
I received a call this morning from 5W, [name], RN that the patient did not receive oxygen to his home after discharge from the hospital yesterday.  I called [place], liason with [employer] and ordered Home O2 to be delivered to his home this morning.  I called the patient at home and informed him that [employer] agency would be calling to deliver the needed O2.  Patient understood and stated that he would be home today and expecting services as ordered.

## 2019-12-17 ENCOUNTER — Encounter: Payer: Self-pay | Admitting: Medical

## 2019-12-17 ENCOUNTER — Other Ambulatory Visit: Payer: Self-pay

## 2019-12-17 ENCOUNTER — Ambulatory Visit (INDEPENDENT_AMBULATORY_CARE_PROVIDER_SITE_OTHER): Payer: BC Managed Care – PPO | Admitting: Medical

## 2019-12-17 VITALS — BP 130/70 | HR 83 | Ht 72.0 in | Wt 276.4 lb

## 2019-12-17 DIAGNOSIS — J9601 Acute respiratory failure with hypoxia: Secondary | ICD-10-CM

## 2019-12-17 DIAGNOSIS — Z951 Presence of aortocoronary bypass graft: Secondary | ICD-10-CM

## 2019-12-17 DIAGNOSIS — R9389 Abnormal findings on diagnostic imaging of other specified body structures: Secondary | ICD-10-CM

## 2019-12-17 DIAGNOSIS — E118 Type 2 diabetes mellitus with unspecified complications: Secondary | ICD-10-CM

## 2019-12-17 DIAGNOSIS — E871 Hypo-osmolality and hyponatremia: Secondary | ICD-10-CM | POA: Diagnosis not present

## 2019-12-17 DIAGNOSIS — E875 Hyperkalemia: Secondary | ICD-10-CM | POA: Diagnosis not present

## 2019-12-17 DIAGNOSIS — U071 COVID-19: Secondary | ICD-10-CM

## 2019-12-17 DIAGNOSIS — E1165 Type 2 diabetes mellitus with hyperglycemia: Secondary | ICD-10-CM

## 2019-12-17 DIAGNOSIS — E66812 Obesity, class 2: Secondary | ICD-10-CM

## 2019-12-17 DIAGNOSIS — Z87891 Personal history of nicotine dependence: Secondary | ICD-10-CM

## 2019-12-17 DIAGNOSIS — IMO0002 Reserved for concepts with insufficient information to code with codable children: Secondary | ICD-10-CM

## 2019-12-17 DIAGNOSIS — E669 Obesity, unspecified: Secondary | ICD-10-CM

## 2019-12-17 LAB — CBC
Hematocrit: 41.4 % (ref 37.5–51.0)
Hemoglobin: 14.3 g/dL (ref 13.0–17.7)
MCH: 32.7 pg (ref 26.6–33.0)
MCHC: 34.5 g/dL (ref 31.5–35.7)
MCV: 95 fL (ref 79–97)
Platelets: 128 10*3/uL — ABNORMAL LOW (ref 150–450)
RBC: 4.37 x10E6/uL (ref 4.14–5.80)
RDW: 13.4 % (ref 11.6–15.4)
WBC: 6.5 10*3/uL (ref 3.4–10.8)

## 2019-12-17 LAB — BASIC METABOLIC PANEL
BUN/Creatinine Ratio: 11 (ref 10–24)
BUN: 9 mg/dL (ref 8–27)
CO2: 23 mmol/L (ref 20–29)
Calcium: 9.1 mg/dL (ref 8.6–10.2)
Chloride: 104 mmol/L (ref 96–106)
Creatinine, Ser: 0.84 mg/dL (ref 0.76–1.27)
GFR calc Af Amer: 109 mL/min/{1.73_m2} (ref >59)
GFR calc non Af Amer: 95 mL/min/{1.73_m2} (ref >59)
Glucose: 152 mg/dL — ABNORMAL HIGH (ref 65–99)
Potassium: 4.9 mmol/L (ref 3.5–5.2)
Sodium: 141 mmol/L (ref 134–144)

## 2019-12-17 MED ORDER — BREO ELLIPTA 100-25 MCG/INH IN AEPB: 1.0000 | INHALATION_SPRAY | Freq: Every day | RESPIRATORY_TRACT | 0 refills | Status: DC

## 2019-12-17 NOTE — Progress Notes (Signed)
Subjective: Chief Complaint  Patient presents with  . Hospitalization Follow-up    respiratory failure-pneumonia    Here for hospital f/u.   Was admitted 11/23/19 - 11/29/19.  Was hospitalized with acute hypoxemic respiratory failure due to covid infection, and active problems including diabetes, obesity, dyslipidemia, mild hyponatremia, hyperkalemia, CAD, elevated D-dimer,  s/p CABG.   Was advised to have BMET, CBC, and CXR on f/u after discharge.  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    In the last week still getting a little SOB, particular with a lot of walking.  Uses inhaler some and this does help.     He wonders if he had lung problems prior.  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  He notes smoking history:  Last smoked > 20 years ago, smoked a pipe occasionally, or cigar occasionally.   No cigarettes regularly  Doesn't check BPs.   Hasn't been checking sugars since being out of the hospital.    No other aggravating or relieving factors.    No other c/o.  Past Medical History:  Diagnosis Date  . CAD (coronary artery disease)    a. NSTEMI 01/2017 s/p CABG.  . Carotid arterial disease (HCC)    a. mild 1-39% 01/2017.  Marland Kitchen Dyslipidemia   . Migraine    "only in [employer]" (01/23/2017)  . Morbid obesity (HCC)   . NSTEMI (non-ST elevated myocardial infarction) (HCC) 01/23/2017  . Type I diabetes mellitus (HCC) 07/2016   Current Outpatient Medications on File Prior to Visit  Medication Sig Dispense Refill  . albuterol (VENTOLIN HFA) 108 (90 Base) MCG/ACT inhaler Inhale 2 puffs into the lungs every 6 (six) hours as needed for wheezing or shortness of breath. 6.7 g 0  .  apixaban (ELIQUIS) 2.5 MG TABS tablet Take 1 tablet (2.5 mg total) by mouth 2 (two) times daily. 28 tablet 0  . aspirin EC 81 MG tablet Take 1 tablet (81 mg total) by mouth daily. 90 tablet 3  . atorvastatin (LIPITOR) 80 MG tablet TAKE 1 TABLET BY MOUTH  DAILY AT 6 PM (Patient taking differently: Take 80 mg by mouth daily at 6 PM. ) 90 tablet 3  . metFORMIN (GLUCOPHAGE) 1000 MG tablet Take 1 tablet (1,000 mg total) by mouth 2 (two) times daily with a meal. 180 tablet 1  . pantoprazole (PROTONIX) 40 MG tablet Take 1 tablet (40 mg total) by mouth daily. 30 tablet 0   No current facility-administered medications on file prior to visit.     The following portions of the patient's history were reviewed and updated as appropriate: allergies, current medications, past family history, past medical history, past social history, past surgical history and problem list.  ROS Otherwise as in subjective above    Objective: BP 130/70   Pulse 83   Ht 6' (1.829 m)   Wt 276 lb 6.4 oz (125.4 kg)   SpO2 90%   BMI 37.49 kg/m   Wt Readings from Last 3 Encounters:  12/17/19 276 lb 6.4 oz (125.4 kg)  11/23/19 275 lb (124.7 kg)  09/23/19 289 lb 12.8 oz (131.5 kg)    General appearance: alert, no distress, well developed,  well nourished, obese white male HEENT: normocephalic, sclerae anicteric, conjunctiva pink and moist, TMs pearly, nares patent, no discharge or erythema, pharynx normal Oral cavity: MMM, no lesions Neck: supple, no lymphadenopathy, no thyromegaly, no masses, no JVD Heart: RRR, normal S1, S2, no murmurs Lungs: lower field crackles, otherwise clear, no wheezes, rhonchi Abdomen: +bs, soft, non tender, non distended, no masses, no hepatomegaly, no splenomegaly Pulses: 2+ radial pulses, 2+ pedal pulses, normal cap refill Ext: no edema   Assessment: Encounter Diagnoses  Name Primary?  . Acute hypoxemic respiratory failure due to COVID-19 (HCC) Yes  . Uncontrolled type 2 diabetes  mellitus with complication, without long-term current use of insulin (HCC)   . Hyperkalemia   . Hyponatremia   . Obesity, Class II, BMI 35-39.9   . S/P CABG x 4   . Abnormal chest x-ray   . Former smoker      Plan: Reviewed hospital notes, discharge summery, medications reconciled  He is improving but still some SOB.  Begin trial of Breo inhaler, sample given today.  C/t albuterol  Abnormal chest xray 2018, prior abnormal PFT 2018 showing obstructive airway with good response to inhaler suggesting prior asthma.   He is due for imaging f/u but instead of xray we will pursue chest ct given prior concern for emphysema 2018 chest xray  Obesity - c/t efforts to lose weight  Diabetes - advised he monitor glucose, repeat labs today, continue current medication  Hx/o CABG - advised he follow up with cardiology now that he is s/p covid infection given potential cardiac risks from covid.   Joshua Howell was seen today for hospitalization follow-up.  Diagnoses and all orders for this visit:  Acute hypoxemic respiratory failure due to COVID-19 [facility]) -     Basic metabolic panel -     CBC -     CT Chest Wo Contrast; Future  Uncontrolled type 2 diabetes mellitus with complication, without long-term current use of insulin (HCC) -     Basic metabolic panel -     CBC  Hyperkalemia -     Basic metabolic panel -     CBC  Hyponatremia  Obesity, Class II, BMI 35-39.9  S/P CABG x 4 -     CT Chest Wo Contrast; Future  Abnormal chest x-ray -     CT Chest Wo Contrast; Future  Former smoker -     CT Chest Wo Contrast; Future  Other orders -     fluticasone furoate-vilanterol (BREO ELLIPTA) 100-25 MCG/INH AEPB; Inhale 1 puff into the lungs daily.    Follow up: pending labs, CT

## 2019-12-17 NOTE — Patient Instructions (Signed)
Recommendations  Take it easy and pace yourself getting back to activity over the next week or 2  Begin the Breo preventative daily inhaler 1 puff daily for the next 2 weeks.  Rinse mouth out with water after use and swallow  Continue your albuterol rescue inhaler 2 puffs as needed for cough wheezing shortness of breath or tightness in the chest  Try to drink at least 80 to 100 ounces of water daily particularly if you are outside  Try to be in the shade when possible outside  Listen to your body on the heat and take breaks more often for the next 2 weeks until your body adjusts  Please call and schedule follow-up with your cardiologist and let them know you have had Covid infection and that you want your heart checked out given potential risk to the heart  Continue other medicines as usual  I do recommend you check your blood sugars fasting every day.  The goal is to be less than 130 fasting in the morning.  If your numbers are not running in normal range then we need to discuss or make some modifications to your treatment  We will schedule you for CT chest

## 2019-12-28 ENCOUNTER — Telehealth: Payer: Self-pay | Admitting: Medical

## 2019-12-28 NOTE — Telephone Encounter (Signed)
pts daughter called and states that pt is trying to get advanced home care to come get his O2 he states that he no longer needs it, pt would like you to DC it, pt can be reached at [phone]

## 2019-12-29 ENCOUNTER — Ambulatory Visit
Admission: RE | Admit: 2019-12-29 | Discharge: 2019-12-29 | Disposition: A | Discharge status: Home / Self Care | Payer: BC Managed Care – PPO | Source: Ambulatory Visit | Attending: Medical | Admitting: Medical

## 2019-12-29 ENCOUNTER — Other Ambulatory Visit: Payer: Self-pay

## 2019-12-29 DIAGNOSIS — I2510 Atherosclerotic heart disease of native coronary artery without angina pectoris: Secondary | ICD-10-CM | POA: Diagnosis not present

## 2019-12-29 DIAGNOSIS — R9389 Abnormal findings on diagnostic imaging of other specified body structures: Secondary | ICD-10-CM

## 2019-12-29 DIAGNOSIS — U071 COVID-19: Secondary | ICD-10-CM

## 2019-12-29 DIAGNOSIS — J9601 Acute respiratory failure with hypoxia: Secondary | ICD-10-CM

## 2019-12-29 DIAGNOSIS — Z87891 Personal history of nicotine dependence: Secondary | ICD-10-CM

## 2019-12-29 DIAGNOSIS — I700 Atherosclerosis of aorta: Secondary | ICD-10-CM | POA: Diagnosis not present

## 2019-12-29 DIAGNOSIS — K8080 Other cholelithiasis without obstruction: Secondary | ICD-10-CM | POA: Diagnosis not present

## 2019-12-29 DIAGNOSIS — Z951 Presence of aortocoronary bypass graft: Secondary | ICD-10-CM

## 2019-12-29 NOTE — Telephone Encounter (Signed)
Please use prescription pad and write for "discontinue oxygen therapy" and I'll sign

## 2019-12-30 NOTE — Telephone Encounter (Signed)
done

## 2019-12-30 NOTE — Telephone Encounter (Signed)
Please sign prescription

## 2019-12-31 DIAGNOSIS — U071 COVID-19: Secondary | ICD-10-CM | POA: Diagnosis not present

## 2019-12-31 DIAGNOSIS — J9601 Acute respiratory failure with hypoxia: Secondary | ICD-10-CM | POA: Diagnosis not present

## 2020-03-21 ENCOUNTER — Other Ambulatory Visit: Payer: Self-pay

## 2020-03-21 ENCOUNTER — Telehealth: Payer: Self-pay | Admitting: Medical

## 2020-03-21 DIAGNOSIS — Z951 Presence of aortocoronary bypass graft: Secondary | ICD-10-CM

## 2020-03-21 NOTE — Telephone Encounter (Signed)
Pt called and states that since he has had a bi-pass procedure [place]OT is requiring he have a stress test. That has to be set up by his PCP. Please advise pt at [phone].

## 2020-03-21 NOTE — Telephone Encounter (Signed)
Refer back to cardiology for updated consult

## 2020-03-21 NOTE — Telephone Encounter (Signed)
Referral sent 

## 2020-03-23 NOTE — Telephone Encounter (Signed)
An order for patient to have a stress test is needed. Please place.

## 2020-03-23 NOTE — Telephone Encounter (Signed)
He hasn't seen cardiology since 2019.  He is due back for routine follow up as well.  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

## 2020-03-24 NOTE — Telephone Encounter (Signed)
[facility], call and see if they can start with having him schedule for an updated stress test, nuclear stress test.  If not, he will need to see them in consult.  I can't simply order a stress test.  The cardiologist typically orders this.  This is done through the cardiology office.

## 2020-03-24 NOTE — Telephone Encounter (Signed)
Patient has told them he does not want an appointment for cardiology. He just wants to have the stress test done.

## 2020-03-29 ENCOUNTER — Telehealth: Payer: Self-pay | Admitting: Cardiology

## 2020-03-29 NOTE — Telephone Encounter (Signed)
Cardiology is handling it from here.

## 2020-03-29 NOTE — Telephone Encounter (Signed)
Pt is scheduled to see Herma Carson PA-C for 04/20/20 at 0845, for DOT clearance.  Pt made aware of appt date and time by [facility].

## 2020-03-29 NOTE — Telephone Encounter (Signed)
Spoke with our Scheduler Eino Farber and informed her that this pt will need to be scheduled to see an APP in the office for clearance and ordering of testing, in order to clear him from a cardiac perspective for DOT clearance.  Pt has not been seen by Dr. Delton See in the office.  He has been seen by  APPs in our office, but its been since 2019.  To safely be able to provide DOT clearance, he will need to have an appt scheduled with an APP first.  Scheduler will call the pt now and arrange this appt.

## 2020-03-29 NOTE — Telephone Encounter (Signed)
New Message  Pt had a referral sent to see cardiology, I contacted the pt to schedule but he said he didn't need a referral to Cardiology, he asked his PCP to send an order for a stress test. Victorio Palm, CMA from Dr Star Age office, messaged me via secure chat and asked if we can place the order for a stress test. I advised her I would reach out to Dr Delton See, he last saw in 2019 and request the order.  Victorio Palm, CMA hi, we wanted to find out of there is any way this patient can be scheduled for an update stress test, nuclear stress test for DOT purposes? this test is typically ordered by cardiology.   Please Advise

## 2020-03-29 NOTE — Telephone Encounter (Signed)
Patient is already an established patient at cardiology. Will have patient to come in for an appointment if need be before placing the order for stress test.

## 2020-03-29 NOTE — Telephone Encounter (Signed)
I do not know what was to do with this.  In my mind he just needs to go see cardiology and let them go from there and order the test themselves.  If cardiology is allowing [place] to order a stress test, then call them and give the okay for a nuclear stress test but it makes more sense for him to go see cardiology.  Please do not send this back to me, either referral or schedule

## 2020-03-29 NOTE — Telephone Encounter (Signed)
Checking to see if patient can be scheduled to have the stress test done.

## 2020-04-18 NOTE — Progress Notes (Signed)
Cardiology Office Note    Date:  04/20/2020   ID:  Joshua Howell, DOB 1961-04-26, MRN [id number]  PCP:  Jac Canavan, PA-C  Cardiologist: Tobias Alexander, MD EPS: None  Chief Complaint  Patient presents with  . Follow-up    History of Present Illness:  Joshua Howell is a 59 y.o. male with CAD S/P NSTEMI 01/23/17 who underwent CABG 4, 01/28/17 with LIMA to the LAD, SVG to the ramus intermediate, sequential SVG to PDA and PLVB. Patient also has hypertension, type 2 diabetes mellitus, family history of CAD. He had normal LV function EF 55-60% at cath.    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  Patient was hospitalized 11/29/2019 with acute hypoxic respiratory failure secondary to COVID-19 pneumonia.  Patient is active at work outside for [employer]. Denies chest pain. Has some DOE from asthms relieved with inhaler. BP running high today. Says he has white coat syndrome. No longer on metoprolol. Doesn't check BP at home. Doesn't eat much salt. No regular exercise outside of work. 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       Past Medical History:  Diagnosis Date  . CAD (coronary artery disease)    a. NSTEMI 01/2017 s/p CABG.  . Carotid arterial disease (HCC)    a. mild 1-39% 01/2017.  Marland Kitchen Dyslipidemia   . Migraine    "only in [employer]" (01/23/2017)  . Morbid obesity (HCC)   . NSTEMI (non-ST elevated myocardial infarction) (HCC) 01/23/2017  . Type I diabetes mellitus (HCC) 07/2016    Past Surgical History:  Procedure Laterality Date  . ANTERIOR CERVICAL DECOMP/DISCECTOMY FUSION     "took a piece off my right hip hip & put it in there"  . BACK SURGERY    . CORONARY ARTERY BYPASS GRAFT N/A 01/28/2017   Procedure: CORONARY ARTERY BYPASS GRAFTING (CABG ) x 4 USING LEFT INTERNAL MAMMARY ARTERY AND ENDOSCOPIC HARVESTING OF RIGHT SAPHENOUS VEIN,  LIMA-LAD SVG-RAMUS SEQ SVG-PD-PL;  Surgeon: [name], MD;  Location: [facility] OR;  Service:  Open Heart Surgery;  Laterality: N/A;  . LEFT HEART CATH AND CORONARY ANGIOGRAPHY N/A 01/24/2017   Procedure: LEFT HEART CATH AND CORONARY ANGIOGRAPHY;  Surgeon: Corky Crafts, MD;  Location: [facility] INVASIVE CV LAB;  Service: Cardiovascular;  Laterality: N/A;  . TEE WITHOUT CARDIOVERSION N/A 01/28/2017   Procedure: TRANSESOPHAGEAL ECHOCARDIOGRAM (TEE);  Surgeon: [name], MD;  Location: [facility] OR;  Service: Open Heart Surgery;  Laterality: N/A;    Current Medications: Current Meds  Medication Sig  . albuterol (VENTOLIN HFA) 108 (90 Base) MCG/ACT inhaler Inhale 2 puffs into the lungs every 6 (six) hours as needed for wheezing or shortness of breath.  Marland Kitchen aspirin EC 81 MG tablet Take 1 tablet (81 mg total) by mouth daily.  Marland Kitchen atorvastatin (LIPITOR) 80 MG tablet TAKE 1 TABLET BY MOUTH  DAILY AT 6 PM (Patient taking differently: Take 80 mg by mouth daily at 6 PM. )  . metFORMIN (GLUCOPHAGE) 1000 MG tablet Take 1 tablet (1,000 mg total) by mouth 2 (two) times daily with a meal.     Allergies:   Patient has no known allergies.   Social History   Socioeconomic History  . Marital status: Widowed    Spouse name: Not on file  . Number of children: Not on file  . Years of education: Not on file  . Highest education level: Not on file  Occupational History  .  Not on file  Tobacco Use  . Smoking status: Former Smoker    Types: Cigarettes  . Smokeless tobacco: Never Used  . Tobacco comment: 01/23/2017 "quit smoking in the 1990s"  Vaping Use  . Vaping Use: Never used  Substance and Sexual Activity  . Alcohol use: Yes    Alcohol/week: 0.0 standard drinks    Comment: 01/23/2017 "maybe 4 beers/month"  . Drug use: No  . Sexual activity: Not Currently  Other Topics Concern  . Not on file  Social History Narrative   Lives alone.   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 Difficulty of Paying Living Expenses: Not on file   Food Insecurity:   . Worried About Programme researcher, broadcasting/film/videounning Out of Food in the Last Year: Not on file  . Ran Out of Food in the Last Year: Not on file  Transportation Needs:   . Lack of Transportation (Medical): Not on file  . Lack of Transportation (Non-Medical): Not on file  Physical Activity:   . Days of Exercise per Week: Not on file  . Minutes of Exercise per Session: Not on file  Stress:   . Feeling of Stress : Not on file  Social Connections:   . Frequency of Communication with Friends and Family: Not on file  . Frequency of Social Gatherings with Friends and Family: Not on file  . Attends Religious Services: Not on file  . Active Member of Clubs or Organizations: Not on file  . Attends BankerClub or Organization Meetings: Not on file  . Marital Status: Not on file     Family History:  The patient's family history includes Aneurysm in his mother; Gallbladder disease in his mother; Heart disease (age of onset: 3245) in his brother; Heart disease (age of onset: 455) in his father; Other in his brother; Stroke in his mother.   ROS:   Please see the history of present illness.    ROS All other systems reviewed and are negative.   PHYSICAL EXAM:   VS:  BP (!) 152/86   Pulse 75   Ht 6' (1.829 m)   Wt 286 lb (129.7 kg)   SpO2 94%   BMI 38.79 kg/m   Physical Exam  GEN: Obese, in no acute distress  Neck: no JVD, carotid bruits, or masses Cardiac:RRR; no murmurs, rubs, or gallops  Respiratory:  clear to auscultation bilaterally, normal work of breathing GI: soft, nontender, nondistended, + BS Ext: without cyanosis, clubbing, or edema, Good distal pulses bilaterally Neuro:  Alert and Oriented x 3, Psych: euthymic mood, full affect  Wt Readings from Last 3 Encounters:  04/20/20 286 lb (129.7 kg)  12/17/19 276 lb 6.4 oz (125.4 kg)  11/23/19 275 lb (124.7 kg)      Studies/Labs Reviewed:   EKG:  EKG is not ordered today.  The ekg reviewed from 11/23/19 NSR with nonspecific ST changes Recent  Labs: 11/28/2019: ALT 83 11/29/2019: B Natriuretic Peptide 105.8; Magnesium 2.3 12/17/2019: BUN 9; Creatinine, Ser 0.84; Hemoglobin 14.3; Platelets 128; Potassium 4.9; Sodium 141   Lipid Panel    Component Value Date/Time   CHOL 86 (L) 09/23/2019 0937   TRIG 89 09/23/2019 0937   HDL 43 09/23/2019 0937   CHOLHDL 2.0 09/23/2019 0937   CHOLHDL 3.8 01/24/2017 0640   VLDL 26 01/24/2017 0640   LDLCALC 25 09/23/2019 0937    Additional studies/ records that were reviewed today include:    Cardiac  cath 01/24/17    Mid RCA lesion, 80 %stenosed. Proximal vessel is ectatic.  Post Atrio lesion, 100 %stenosed.  Ramus lesion, 95 %stenosed.  Ost LAD to Prox LAD lesion, 90 %stenosed.  The left ventricular systolic function is normal.  LV end diastolic pressure is normal.  The left ventricular ejection fraction is 55-65% by visual estimate.  There is no aortic valve stenosis.   2-D echo 9/13/18Study Conclusions   - Left ventricle: The cavity size was normal. Systolic function was   normal. The estimated ejection fraction was in the range of 60%   to 65%. Wall motion was normal; there were no regional wall   motion abnormalities. - Right atrium: The atrium was mildly dilated.   Carotid Dopplers 9/14/18Summary:  - Findings are consistent with a 1-39 percent stenosis involving   the right internal carotid artery and the left internal carotid   artery. The vertebral arteries demonstrate antegrade flow. - Right ABI of 1.07 and left ABI of 1.01 are suggestive of arterial   flow within normal limits at rest.  Prepared and Electronically Authenticated by  Gretta Began, MD 2018-09-15T10:38:07           ASSESSMENT:    1. Coronary artery disease involving coronary bypass graft of native heart without angina pectoris   2. Mixed hyperlipidemia   3. Obesity, Class II, BMI 35-39.9   4. Uncontrolled type 2 diabetes mellitus with complication, without long-term current use of insulin  (HCC)   5. Essential hypertension      PLAN:  In order of problems listed above:  CAD status post NSTEMI and CABG x4 01/28/17. Patient doing well. Continue aspirin and Lipitor. Patient no longer on metoprolol and doesn't want to take it. Will schedule lexiscan myoview for DOT physical clearance  Mixed dyslipidemia continue Lipitor.  Managed by PCP.  LDL 25 09/23/2019   Morbid obesity exercise and weight loss program recommended   Diabetes mellitus on metformin made by primary care  Hypertension patient's blood pressure is up today.  He says he has whitecoat syndrome and goes down his soon as he leaves here.  He has a cuff at home but has not been checking his blood pressure regularly.  Recommend he recheck his blood pressures and follow-up with PCP.  Does not want to take metoprolol or any antihypertensives.     The risks [chest pain, shortness of breath, cardiac arrhythmias, dizziness, blood pressure fluctuations, myocardial infarction, stroke/transient ischemic attack, nausea, vomiting, allergic reaction, radiation exposure, metallic taste sensation and life-threatening complications (estimated to be 1 in 10,000)], benefits (risk stratification, diagnosing coronary artery disease, treatment guidance) and alternatives of a nuclear stress test were discussed in detail with Mr. Skipper and he agrees to proceed.        Medication Adjustments/Labs and Tests Ordered: Current medicines are reviewed at length with the patient today.  Concerns regarding medicines are outlined above.  Medication changes, Labs and Tests ordered today are listed in the Patient Instructions below. Patient Instructions  Medication Instructions:  Your physician recommends that you continue on your current medications as directed. Please refer to the Current Medication list given to you today.  *If you need a refill on your cardiac medications before your next appointment, please call your pharmacy*   Lab  Work: None If you have labs (blood work) drawn today and your tests are completely normal, you will receive your results only by: Marland Kitchen MyChart Message (if you have MyChart) OR . A paper copy in the  mail If you have any lab test that is abnormal or we need to change your treatment, we will call you to review the results.   Testing/Procedures: Your physician has requested that you have a lexiscan myoview. For further information please visit [email or web address] Please follow instruction sheet, as given.   Follow-Up: At [facility], you and your health needs are our priority.  As part of our continuing mission to provide you with exceptional heart care, we have created designated Provider Care Teams.  These Care Teams include your primary Cardiologist (physician) and Advanced Practice Providers (APPs -  Physician Assistants and Nurse Practitioners) who all work together to provide you with the care you need, when you need it.  We recommend signing up for the patient portal called "MyChart".  Sign up information is provided on this After Visit Summary.  MyChart is used to connect with patients for Virtual Visits (Telemedicine).  Patients are able to view lab/test results, encounter notes, upcoming appointments, etc.  Non-urgent messages can be sent to your provider as well.   To learn more about what you can do with MyChart, go to [email or web address].    Your next appointment:   1 year(s)  The format for your next appointment:   In Person  Provider:   You may see Tobias Alexander, MD or one of the following Advanced Practice Providers on your designated Care Team:    Ronie Spies, PA-C  Jacolyn Reedy, PA-C      Signed, Jacolyn Reedy, PA-C  04/20/2020 9:38 AM    [facility] [address] [place], [place], [place]  [address] Phone: [phone]; Fax: [phone]

## 2020-04-20 ENCOUNTER — Other Ambulatory Visit: Payer: Self-pay

## 2020-04-20 ENCOUNTER — Encounter: Payer: Self-pay | Admitting: Physician Assistant

## 2020-04-20 ENCOUNTER — Ambulatory Visit (INDEPENDENT_AMBULATORY_CARE_PROVIDER_SITE_OTHER): Payer: BC Managed Care – PPO | Admitting: Physician Assistant

## 2020-04-20 VITALS — BP 152/86 | HR 75 | Ht 72.0 in | Wt 286.0 lb

## 2020-04-20 DIAGNOSIS — E782 Mixed hyperlipidemia: Secondary | ICD-10-CM

## 2020-04-20 DIAGNOSIS — E1165 Type 2 diabetes mellitus with hyperglycemia: Secondary | ICD-10-CM

## 2020-04-20 DIAGNOSIS — E669 Obesity, unspecified: Secondary | ICD-10-CM | POA: Diagnosis not present

## 2020-04-20 DIAGNOSIS — E118 Type 2 diabetes mellitus with unspecified complications: Secondary | ICD-10-CM

## 2020-04-20 DIAGNOSIS — I10 Essential (primary) hypertension: Secondary | ICD-10-CM

## 2020-04-20 DIAGNOSIS — IMO0002 Reserved for concepts with insufficient information to code with codable children: Secondary | ICD-10-CM

## 2020-04-20 DIAGNOSIS — I25810 Atherosclerosis of coronary artery bypass graft(s) without angina pectoris: Secondary | ICD-10-CM

## 2020-04-20 NOTE — Progress Notes (Signed)
He is due for fasting med check

## 2020-04-20 NOTE — Patient Instructions (Signed)
Medication Instructions:  Your physician recommends that you continue on your current medications as directed. Please refer to the Current Medication list given to you today.  *If you need a refill on your cardiac medications before your next appointment, please call your pharmacy*   Lab Work: None If you have labs (blood work) drawn today and your tests are completely normal, you will receive your results only by: Marland Kitchen MyChart Message (if you have MyChart) OR . A paper copy in the mail If you have any lab test that is abnormal or we need to change your treatment, we will call you to review the results.   Testing/Procedures: Your physician has requested that you have a lexiscan myoview. For further information please visit [email or web address] Please follow instruction sheet, as given.   Follow-Up: At [facility], you and your health needs are our priority.  As part of our continuing mission to provide you with exceptional heart care, we have created designated Provider Care Teams.  These Care Teams include your primary Cardiologist (physician) and Advanced Practice Providers (APPs -  Physician Assistants and Nurse Practitioners) who all work together to provide you with the care you need, when you need it.  We recommend signing up for the patient portal called "MyChart".  Sign up information is provided on this After Visit Summary.  MyChart is used to connect with patients for Virtual Visits (Telemedicine).  Patients are able to view lab/test results, encounter notes, upcoming appointments, etc.  Non-urgent messages can be sent to your provider as well.   To learn more about what you can do with MyChart, go to [email or web address].    Your next appointment:   1 year(s)  The format for your next appointment:   In Person  Provider:   You may see Tobias Alexander, MD or one of the following Advanced Practice Providers on your designated Care Team:    Ronie Spies, PA-C  Jacolyn Reedy, PA-C

## 2020-04-25 ENCOUNTER — Telehealth: Payer: Self-pay | Admitting: Medical

## 2020-04-25 NOTE — Progress Notes (Signed)
Pt is coming in 05/03/20

## 2020-04-25 NOTE — Telephone Encounter (Signed)
Pt called and is requesting a refill on his albuterol please send to the [id number] - [place], [place] - [address]  Pt has a ppt 40/81/4481

## 2020-04-26 ENCOUNTER — Other Ambulatory Visit: Payer: Self-pay | Admitting: Medical

## 2020-04-26 ENCOUNTER — Telehealth (HOSPITAL_COMMUNITY): Payer: Self-pay | Admitting: *Deleted

## 2020-04-26 MED ORDER — ALBUTEROL SULFATE HFA 108 (90 BASE) MCG/ACT IN AERS: 2.0000 | INHALATION_SPRAY | Freq: Four times a day (QID) | RESPIRATORY_TRACT | 0 refills | Status: DC | PRN

## 2020-04-26 NOTE — Telephone Encounter (Signed)
Done, albuterol sent

## 2020-04-26 NOTE — Telephone Encounter (Signed)
Left message on voicemail per DPR in reference to upcoming appointment scheduled on 04/28/20 at 1:00 with detailed instructions given per Myocardial Perfusion Study Information Sheet for the test. LM to arrive 15 minutes early, and that it is imperative to arrive on time for appointment to keep from having the test rescheduled. If you need to cancel or reschedule your appointment, please call the office within 24 hours of your appointment. Failure to do so may result in a cancellation of your appointment, and a $50 no show fee. Phone number given for call back for any questions.

## 2020-04-28 ENCOUNTER — Ambulatory Visit (HOSPITAL_COMMUNITY): Payer: BC Managed Care – PPO | Attending: Cardiology

## 2020-04-28 ENCOUNTER — Other Ambulatory Visit: Payer: Self-pay

## 2020-04-28 DIAGNOSIS — I25810 Atherosclerosis of coronary artery bypass graft(s) without angina pectoris: Secondary | ICD-10-CM | POA: Insufficient documentation

## 2020-04-28 MED ORDER — TECHNETIUM TC 99M TETROFOSMIN IV KIT
32.6000 | PACK | Freq: Once | INTRAVENOUS | Status: AC | PRN
  Administered 2020-04-28: 32.6 via INTRAVENOUS
  Filled 2020-04-28: qty 33

## 2020-04-28 MED ORDER — REGADENOSON 0.4 MG/5ML IV SOLN
0.4000 mg | Freq: Once | INTRAVENOUS | Status: AC
  Administered 2020-04-28: 0.4 mg via INTRAVENOUS

## 2020-04-29 ENCOUNTER — Ambulatory Visit (HOSPITAL_COMMUNITY): Payer: BC Managed Care – PPO | Attending: Cardiovascular Disease

## 2020-04-29 LAB — MYOCARDIAL PERFUSION IMAGING
LV dias vol: 106 mL (ref 62–150)
LV sys vol: 52 mL
Peak HR: 100 {beats}/min
Rest HR: 81 {beats}/min
SDS: 1
SRS: 3
SSS: 5
TID: 1.1

## 2020-04-29 MED ORDER — TECHNETIUM TC 99M TETROFOSMIN IV KIT
32.2000 | PACK | Freq: Once | INTRAVENOUS | Status: AC | PRN
  Administered 2020-04-29: 32.2 via INTRAVENOUS
  Filled 2020-04-29: qty 33

## 2020-05-03 ENCOUNTER — Other Ambulatory Visit: Payer: Self-pay

## 2020-05-03 ENCOUNTER — Encounter: Payer: Self-pay | Admitting: Medical

## 2020-05-03 ENCOUNTER — Ambulatory Visit (INDEPENDENT_AMBULATORY_CARE_PROVIDER_SITE_OTHER): Payer: BC Managed Care – PPO | Admitting: Medical

## 2020-05-03 VITALS — BP 132/80 | HR 68 | Ht 72.0 in | Wt 279.6 lb

## 2020-05-03 DIAGNOSIS — Z91199 Patient's noncompliance with other medical treatment and regimen due to unspecified reason: Secondary | ICD-10-CM

## 2020-05-03 DIAGNOSIS — Z9119 Patient's noncompliance with other medical treatment and regimen: Secondary | ICD-10-CM

## 2020-05-03 DIAGNOSIS — E669 Obesity, unspecified: Secondary | ICD-10-CM | POA: Diagnosis not present

## 2020-05-03 DIAGNOSIS — Z9289 Personal history of other medical treatment: Secondary | ICD-10-CM

## 2020-05-03 DIAGNOSIS — E118 Type 2 diabetes mellitus with unspecified complications: Secondary | ICD-10-CM | POA: Diagnosis not present

## 2020-05-03 DIAGNOSIS — E782 Mixed hyperlipidemia: Secondary | ICD-10-CM

## 2020-05-03 DIAGNOSIS — IMO0002 Reserved for concepts with insufficient information to code with codable children: Secondary | ICD-10-CM

## 2020-05-03 DIAGNOSIS — E1165 Type 2 diabetes mellitus with hyperglycemia: Secondary | ICD-10-CM | POA: Diagnosis not present

## 2020-05-03 DIAGNOSIS — Z2820 Immunization not carried out because of patient decision for unspecified reason: Secondary | ICD-10-CM

## 2020-05-03 DIAGNOSIS — Z951 Presence of aortocoronary bypass graft: Secondary | ICD-10-CM

## 2020-05-03 DIAGNOSIS — Z8249 Family history of ischemic heart disease and other diseases of the circulatory system: Secondary | ICD-10-CM

## 2020-05-03 DIAGNOSIS — I6529 Occlusion and stenosis of unspecified carotid artery: Secondary | ICD-10-CM

## 2020-05-03 DIAGNOSIS — E875 Hyperkalemia: Secondary | ICD-10-CM

## 2020-05-03 NOTE — Progress Notes (Signed)
Subjective:   Joshua Howell is an 59 y.o. male who presents for follow up of Type 2 diabetes mellitus and med check.   Medical team: Dr. Modesto Charon, Ellwood Handler, PA-C with cardiothoracic surgery Ermalinda Barrios, PA-C, Gentry Fitz, PA-C , Ena Dawley, MD, and Signa Kell MD, cardiology Alivya Wegman, Camelia Eng, PA-C here for primary care  We started the visit today with him being frustrated that he had to see me today.  He thought he was coming in for fasting labs in the visit.  Nevertheless.....  Joshua Howell is a 59 year old white male with history of heart disease, status post CABG, history of NSTEMI, diabetes, dyslipidemia, morbid obesity, ED, and some noncompliance.  He also had Covid infection with hypoxemic respiratory failure July of this year 2021  Date of diagnosis of diabetes: 05/2016 Current treatments: Metformin 1000mg  BID Medication compliance: good  Patient is not checking home blood sugars.   Home blood sugar records: none Current symptoms include: none. Patient denies foot ulcerations, hyperglycemia, hypoglycemia , nausea, paresthesia of the feet, polydipsia, polyuria and visual disturbances.  Patient is checking their feet daily. Foot concerns (callous, ulcer, wound, thickened nails, toenail fungus, skin fungus, hammer toe): none Last dilated eye exam within 72mo Current exercise: active on the job, Counselling psychologist, on call often physical job Known diabetic complications: cardiovascular disease   Hyperlipidemia - compliant with Lipitor 80mg  daily, taking aspirin daily  He notes eye doctor visit within recent months.   Sees eye doctor on [address] and [address].  He states they are aware he is diabetic and has a "spot" on his eye.     No other aggravating or relieving factors. No other complaint.    Past Medical History:  Diagnosis Date  . CAD (coronary artery disease)    a. NSTEMI 01/2017 s/p CABG.  . Carotid arterial disease ([place])    a. mild  1-39% 01/2017.  Marland Kitchen Dyslipidemia   . Migraine    "only in [employer]" (01/23/2017)  . Morbid obesity ([place])   . NSTEMI (non-ST elevated myocardial infarction) ([place]) 01/23/2017  . Type I diabetes mellitus ([place]) 07/2016    Current Outpatient Medications on File Prior to Visit  Medication Sig Dispense Refill  . albuterol (VENTOLIN HFA) 108 (90 Base) MCG/ACT inhaler Inhale 2 puffs into the lungs every 6 (six) hours as needed for wheezing or shortness of breath. 18 g 0  . aspirin EC 81 MG tablet Take 1 tablet (81 mg total) by mouth daily. 90 tablet 3  . atorvastatin (LIPITOR) 80 MG tablet TAKE 1 TABLET BY MOUTH  DAILY AT 6 PM (Patient taking differently: Take 80 mg by mouth daily at 6 PM.) 90 tablet 3  . metFORMIN (GLUCOPHAGE) 1000 MG tablet Take 1 tablet (1,000 mg total) by mouth 2 (two) times daily with a meal. 180 tablet 1   No current facility-administered medications on file prior to visit.     The following portions of the patient's history were reviewed and updated as appropriate: allergies, current medications, past family history, past medical history, past social history, past surgical history and problem list.  ROS as in subjective above    Objective:   BP 132/80   Pulse 68   Ht 6' (1.829 m)   Wt 279 lb 9.6 oz (126.8 kg)   SpO2 97%   BMI 37.92 kg/m   Wt Readings from Last 3 Encounters:  05/03/20 279 lb 9.6 oz (126.8 kg)  04/28/20 286 lb (129.7 kg)  04/20/20  286 lb (129.7 kg)   General appearence: alert, no distress, WD/WN,  Neck: supple, no lymphadenopathy, no thyromegaly, no masses, no bruits Heart: RRR, normal S1, S2, no murmurs Lungs: CTA bilaterally, no wheezes, rhonchi, or rales Anterior surgical scar chest from CABG Pulses: 2+ symmetric, upper and lower extremities, normal cap refill He has Full lace up work boots so he did not take off his boots today     Assessment:   Encounter Diagnoses  Name Primary?  Marland Kitchen Uncontrolled type 2 diabetes mellitus  with complication, without long-term current use of insulin ([place]) Yes  . Mixed dyslipidemia   . Noncompliance   . Obesity, Class II, BMI 35-39.9   . S/P CABG x 4   . Vaccine refused by patient   . Hyperkalemia   . Family history of premature CAD   . Stenosis of carotid artery, unspecified laterality   . H/O cardiovascular stress test      Plan:   I reviewed his medications, chart record.  Unfortunately there is a degree of noncompliance and stubbornness on his part.  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  We had referred to cardiology but he gave [place] a hard time about that and only one of the test without having a follow-up appointment.  We went back and forth about this but finally was able to get a stress test ordered.  Cardiology sent me the raw data without an interpretation.  He feels like he is here to discuss results today.  I told him I was not a cardiologist and cannot sign off on the report since I do not have the full interpretation.  I did send an email to cardiology to send a result or interpretation.  This is what I tried to explain to him weeks ago when he called in about the test.  So he will likely need to go back and see the cardiologist.  He will need this reported on for DOT clearance,  which the DOT exam was done at another office  Diabetes Mellitus type 2: Noncompliance in terms of follow-up visits, noncompliance with vaccines and some preventative measures  Education: Reviewed 'ABCs' of diabetes management (respective goals in parentheses):  A1C (<7), blood pressure (<130/80), and cholesterol (LDL <100)  Diabetes:   Continue Metformin for now  Labs today  We discussed the importance of routine follow-up, at minimum 2 visits per year.  We discussed importance of daily foot checks, yearly eye doctor visit and having them send [place] a copy of the eye doctor note as we do not have this on file.  We  discussed numerous possible complications of diabetes which warrants routine follow-up and actions on his part in my part to keep things under control.  We discussed importance of glucose monitoring at home, being aware of risk of hypoglycemia.  We discussed routine vaccines but he declines all vaccines.  Hyperlipidemia, history of CABG and coronary artery disease, history of non-STEMI, continue statin and aspirin, continue routine follow-up with cardiology  He has yet to do colon cancer screening.  He has a Cologuard kit at home but just has not done it.  I encouraged him to turn this in  We will try request a copy of his most recent eye doctor report  Of note this is the first time he has come in since I have known him, fasting for lipid.  He normally comes in nonfasting  I reviewed his last visit note and labs from earlier this year.  I signed off on gaps/metric in chart as he declines Hep C lab, all immunizations, and may likely not complete the cologuard  Pauline was seen today for medication management.  Diagnoses and all orders for this visit:  Uncontrolled type 2 diabetes mellitus with complication, without long-term current use of insulin ([facility]) -     Lipid panel -     Hemoglobin A1c -     Comprehensive metabolic panel  Mixed dyslipidemia -     Lipid panel  Noncompliance  Obesity, Class II, BMI 35-39.9  S/P CABG x 4  Vaccine refused by patient  Hyperkalemia  Family history of premature CAD  Stenosis of carotid artery, unspecified laterality  H/O cardiovascular stress test    Follow up: pending labs, call back

## 2020-05-04 ENCOUNTER — Other Ambulatory Visit: Payer: Self-pay | Admitting: Medical

## 2020-05-04 LAB — COMPREHENSIVE METABOLIC PANEL
ALT: 19 IU/L (ref 0–44)
AST: 23 IU/L (ref 0–40)
Albumin/Globulin Ratio: 1.4 (ref 1.2–2.2)
Albumin: 4.3 g/dL (ref 3.8–4.8)
Alkaline Phosphatase: 87 IU/L (ref 44–121)
BUN/Creatinine Ratio: 18 (ref 10–24)
BUN: 14 mg/dL (ref 8–27)
Bilirubin Total: 1.4 mg/dL — ABNORMAL HIGH (ref 0.0–1.2)
CO2: 21 mmol/L (ref 20–29)
Calcium: 9.1 mg/dL (ref 8.6–10.2)
Chloride: 102 mmol/L (ref 96–106)
Creatinine, Ser: 0.8 mg/dL (ref 0.76–1.27)
GFR calc Af Amer: 111 mL/min/{1.73_m2} (ref >59)
GFR calc non Af Amer: 96 mL/min/{1.73_m2} (ref >59)
Globulin, Total: 3 g/dL (ref 1.5–4.5)
Glucose: 140 mg/dL — ABNORMAL HIGH (ref 65–99)
Potassium: 4.9 mmol/L (ref 3.5–5.2)
Sodium: 138 mmol/L (ref 134–144)
Total Protein: 7.3 g/dL (ref 6.0–8.5)

## 2020-05-04 LAB — LIPID PANEL
Chol/HDL Ratio: 2.3 ratio (ref 0.0–5.0)
Cholesterol, Total: 93 mg/dL — ABNORMAL LOW (ref 100–199)
HDL: 41 mg/dL (ref >39)
LDL Chol Calc (NIH): 34 mg/dL (ref 0–99)
Triglycerides: 95 mg/dL (ref 0–149)
VLDL Cholesterol Cal: 18 mg/dL (ref 5–40)

## 2020-05-04 LAB — HEMOGLOBIN A1C
Est. average glucose Bld gHb Est-mCnc: 180 mg/dL
Hgb A1c MFr Bld: 7.9 % — ABNORMAL HIGH (ref 4.8–5.6)

## 2020-05-04 MED ORDER — DAPAGLIFLOZIN PROPANEDIOL 5 MG PO TABS
5.0000 mg | ORAL_TABLET | Freq: Every day | ORAL | 2 refills | Status: DC
Start: 2020-05-04 — End: 2020-11-23

## 2020-05-04 MED ORDER — METFORMIN HCL 1000 MG PO TABS: 1000.0000 mg | ORAL_TABLET | Freq: Two times a day (BID) | ORAL | 3 refills | Status: DC

## 2020-05-04 MED ORDER — ATORVASTATIN CALCIUM 80 MG PO TABS: 80.0000 mg | ORAL_TABLET | Freq: Every day | ORAL | 3 refills | Status: DC

## 2020-05-04 NOTE — Progress Notes (Signed)
note

## 2020-06-06 ENCOUNTER — Encounter: Payer: Self-pay | Admitting: Medical

## 2020-06-09 ENCOUNTER — Encounter: Payer: Self-pay | Admitting: Medical

## 2020-08-27 ENCOUNTER — Other Ambulatory Visit: Payer: Self-pay | Admitting: Medical

## 2020-08-29 NOTE — Telephone Encounter (Signed)
Is this okay to refill? 

## 2020-11-21 ENCOUNTER — Other Ambulatory Visit: Payer: Self-pay

## 2020-11-21 ENCOUNTER — Encounter: Payer: Self-pay | Admitting: Medical

## 2020-11-21 ENCOUNTER — Ambulatory Visit (INDEPENDENT_AMBULATORY_CARE_PROVIDER_SITE_OTHER): Payer: BC Managed Care – PPO | Admitting: Medical

## 2020-11-21 VITALS — BP 130/80 | HR 78 | Ht 72.0 in | Wt 296.8 lb

## 2020-11-21 DIAGNOSIS — Z8616 Personal history of COVID-19: Secondary | ICD-10-CM

## 2020-11-21 DIAGNOSIS — Z0000 Encounter for general adult medical examination without abnormal findings: Secondary | ICD-10-CM | POA: Insufficient documentation

## 2020-11-21 DIAGNOSIS — Z951 Presence of aortocoronary bypass graft: Secondary | ICD-10-CM | POA: Diagnosis not present

## 2020-11-21 DIAGNOSIS — E118 Type 2 diabetes mellitus with unspecified complications: Secondary | ICD-10-CM

## 2020-11-21 DIAGNOSIS — Z1211 Encounter for screening for malignant neoplasm of colon: Secondary | ICD-10-CM

## 2020-11-21 DIAGNOSIS — E1165 Type 2 diabetes mellitus with hyperglycemia: Secondary | ICD-10-CM

## 2020-11-21 DIAGNOSIS — Z2820 Immunization not carried out because of patient decision for unspecified reason: Secondary | ICD-10-CM

## 2020-11-21 DIAGNOSIS — IMO0002 Reserved for concepts with insufficient information to code with codable children: Secondary | ICD-10-CM

## 2020-11-21 DIAGNOSIS — J301 Allergic rhinitis due to pollen: Secondary | ICD-10-CM

## 2020-11-21 DIAGNOSIS — Z125 Encounter for screening for malignant neoplasm of prostate: Secondary | ICD-10-CM

## 2020-11-21 DIAGNOSIS — N529 Male erectile dysfunction, unspecified: Secondary | ICD-10-CM

## 2020-11-21 DIAGNOSIS — J4520 Mild intermittent asthma, uncomplicated: Secondary | ICD-10-CM

## 2020-11-21 DIAGNOSIS — E782 Mixed hyperlipidemia: Secondary | ICD-10-CM | POA: Diagnosis not present

## 2020-11-21 DIAGNOSIS — E669 Obesity, unspecified: Secondary | ICD-10-CM | POA: Diagnosis not present

## 2020-11-21 DIAGNOSIS — Z8249 Family history of ischemic heart disease and other diseases of the circulatory system: Secondary | ICD-10-CM

## 2020-11-21 DIAGNOSIS — I6529 Occlusion and stenosis of unspecified carotid artery: Secondary | ICD-10-CM

## 2020-11-21 NOTE — Patient Instructions (Signed)
This visit was a preventative care visit, also known as wellness visit or routine physical.   Topics typically include healthy lifestyle, diet, exercise, preventative care, vaccinations, sick and well care, proper use of emergency dept and after hours care, as well as other concerns.     Recommendations: Continue to return yearly for your annual wellness and preventative care visits.  This gives [place] a chance to discuss healthy lifestyle, exercise, vaccinations, review your chart record, and perform screenings where appropriate.  I recommend you see your eye doctor yearly for routine vision care.  I recommend you see your dentist yearly for routine dental care including hygiene visits twice yearly.   Vaccination recommendations were reviewed  There is no immunization history on file for this patient.  Vaccines recommended:   Tetanus booster every 10 years, shingles vaccine 2 shots 2 months apart, pneumonia vaccine every 5 years, flu shot yearly in the fall  He declines vaccines   Screening for cancer: Colon cancer screening: He has cologard kit at home.  We discussed doing the screen and turning it in.  I encouraged him to go ahead and take care of the colon cancer screen.  He is a little ambivalent whether he want to even pursue screening for therapy if something were to be found  We discussed PSA, prostate exam, and prostate cancer screening risks/benefits.     Skin cancer screening: Check your skin regularly for new changes, growing lesions, or other lesions of concern Come in for evaluation if you have skin lesions of concern.  Lung cancer screening: If you have a greater than 20 pack year history of tobacco use, then you may qualify for lung cancer screening with a chest CT scan.   Please call your insurance company to inquire about coverage for this test.  We currently don't have screenings for other cancers besides breast, cervical, colon, and lung cancers.  If you have a  strong family history of cancer or have other cancer screening concerns, please let me know.    Bone health: Get at least 150 minutes of aerobic exercise weekly Get weight bearing exercise at least once weekly Bone density test:  A bone density test is an imaging test that uses a type of X-ray to measure the amount of calcium and other minerals in your bones. The test may be used to diagnose or screen you for a condition that causes weak or thin bones (osteoporosis), predict your risk for a broken bone (fracture), or determine how well your osteoporosis treatment is working. The bone density test is recommended for females 72 and older, or females or males <67 if certain risk factors such as thyroid disease, long term use of steroids such as for asthma or rheumatological issues, vitamin D deficiency, estrogen deficiency, family history of osteoporosis, self or family history of fragility fracture in first degree relative.    Heart health: Get at least 150 minutes of aerobic exercise weekly Limit alcohol It is important to maintain a healthy blood pressure and healthy cholesterol numbers  Heart disease screening: Screening for heart disease includes screening for blood pressure, fasting lipids, glucose/diabetes screening, BMI height to weight ratio, reviewed of smoking status, physical activity, and diet.    Goals include blood pressure 120/80 or less, maintaining a healthy lipid/cholesterol profile, preventing diabetes or keeping diabetes numbers under good control, not smoking or using tobacco products, exercising most days per week or at least 150 minutes per week of exercise, and eating healthy variety of fruits  and vegetables, healthy oils, and avoiding unhealthy food choices like fried food, fast food, high sugar and high cholesterol foods.    Other tests may possibly include EKG test, CT coronary calcium score, echocardiogram, exercise treadmill stress test.   Continue routine follow  up with cardiology.   Medical care options: I recommend you continue to seek care here first for routine care.  We try really hard to have available appointments Monday through Friday daytime hours for sick visits, acute visits, and physicals.  Urgent care should be used for after hours and weekends for significant issues that cannot wait till the next day.  The emergency department should be used for significant potentially life-threatening emergencies.  The emergency department is expensive, can often have long wait times for less significant concerns, so try to utilize primary care, urgent care, or telemedicine when possible to avoid unnecessary trips to the emergency department.  Virtual visits and telemedicine have been introduced since the pandemic started in 2020, and can be convenient ways to receive medical care.  We offer virtual appointments as well to assist you in a variety of options to seek medical care.    Separate significant issues discussed: Diabetes-routine labs today, continue routine medication, advised glucometer testing, yearly eye doctor visit.  He is due to see the eye doctor now  High cholesterol-continue Lipitor and aspirin daily  Obesity-work on losing weight through healthy diet and exercise

## 2020-11-21 NOTE — Progress Notes (Signed)
Subjective:   HPI  Joshua Howell is a 60 y.o. male who presents for Chief Complaint  Patient presents with   fasting cpe    Fasting cpe, no concerns    Patient Care Team: Shanai Lartigue, Camelia Eng, PA-C as PCP - General (Family Medicine) Dorothy Spark, MD (Inactive) as PCP - Cardiology (Cardiology) Sees dentist Sees eye doctor, due for visit, thinks its [facility] Frankey Shown, orthopedics   Concerns: Wants refill on inhaler.  He uses inhaler periodically.  He is trying to do some remodeling at a house and sometimes does it flares up his breathing.  He uses inhaler on average 1 or 2 times per week.  No prior preventative maintenance medicine.  He does want to make sure his inhaler does not run out  Diabetes-checks glucose sometimes but not every day.  He says recent numbers have been fine.  No foot lesions.  He is compliant with medication  Hyperlipidemia-compliant with Lipitor without complaint   Reviewed their medical, surgical, family, social, medication, and allergy history and updated chart as appropriate.  Past Medical History:  Diagnosis Date   CAD (coronary artery disease)    a. NSTEMI 01/2017 s/p CABG.   Carotid arterial disease (HCC)    a. mild 1-39% 01/2017.   Dyslipidemia    Hx of CABG    Hyperlipidemia    Migraine    "only in [employer]" (01/23/2017)   Morbid obesity ([place])    NSTEMI (non-ST elevated myocardial infarction) ([place]) 01/23/2017   type 2 diabetes 07/2016    Past Surgical History:  Procedure Laterality Date   ANTERIOR CERVICAL DECOMP/DISCECTOMY FUSION     "took a piece off my right hip hip & put it in there"   [place] N/A 01/28/2017   Procedure: CORONARY ARTERY BYPASS GRAFTING (CABG ) x 4 USING LEFT INTERNAL MAMMARY ARTERY AND ENDOSCOPIC HARVESTING OF RIGHT SAPHENOUS VEIN,  LIMA-LAD SVG-RAMUS SEQ SVG-PD-PL;  Surgeon: Melrose Nakayama, MD;  Location: [facility];  Service: Open Heart Surgery;  Laterality:  N/A;   LEFT HEART CATH AND CORONARY ANGIOGRAPHY N/A 01/24/2017   Procedure: LEFT HEART CATH AND CORONARY ANGIOGRAPHY;  Surgeon: Jettie Booze, MD;  Location: [facility];  Service: Cardiovascular;  Laterality: N/A;   TEE WITHOUT CARDIOVERSION N/A 01/28/2017   Procedure: TRANSESOPHAGEAL ECHOCARDIOGRAM (TEE);  Surgeon: Melrose Nakayama, MD;  Location: [facility];  Service: Open Heart Surgery;  Laterality: N/A;    Family History  Problem Relation Age of Onset   Gallbladder disease Mother    Aneurysm Mother        brain   Stroke Mother    Heart disease Father 55       3 vessel ABG   Heart disease Brother 69       MI   Other Brother        died of hemorrhage in head after fall     Current Outpatient Medications:    albuterol (VENTOLIN HFA) 108 (90 Base) MCG/ACT inhaler, TAKE 2 PUFFS BY MOUTH EVERY 6 HOURS AS NEEDED FOR WHEEZE OR SHORTNESS OF BREATH, Disp: 8.5 each, Rfl: 1   aspirin EC 81 MG tablet, Take 1 tablet (81 mg total) by mouth daily., Disp: 90 tablet, Rfl: 3   atorvastatin (LIPITOR) 80 MG tablet, Take 1 tablet (80 mg total) by mouth daily at 6 PM., Disp: 90 tablet, Rfl: 3   dapagliflozin propanediol (FARXIGA) 5 MG  TABS tablet, Take 1 tablet (5 mg total) by mouth daily before breakfast., Disp: 30 tablet, Rfl: 2   metFORMIN (GLUCOPHAGE) 1000 MG tablet, Take 1 tablet (1,000 mg total) by mouth 2 (two) times daily with a meal., Disp: 180 tablet, Rfl: 3  No Known Allergies   Review of Systems Constitutional: -fever, -chills, -sweats, -unexpected weight change, -decreased appetite, -fatigue Allergy: -sneezing, -itching, -congestion Dermatology: -changing moles, --rash, -lumps ENT: -runny nose, -ear pain, -sore throat, -hoarseness, -sinus pain, -teeth pain, - ringing in ears, -hearing loss, -nosebleeds Cardiology: -chest pain, -palpitations, -swelling, -difficulty breathing when lying flat, -waking up short of breath Respiratory: -cough, -shortness of breath, -difficulty  breathing with exercise or exertion, -wheezing, -coughing up blood Gastroenterology: -abdominal pain, -nausea, -vomiting, -diarrhea, -constipation, -blood in stool, -changes in bowel movement, -difficulty swallowing or eating Hematology: -bleeding, -bruising  Musculoskeletal: -joint aches, -muscle aches, -joint swelling, -back pain, -neck pain, -cramping, -changes in gait Ophthalmology: denies vision changes, eye redness, itching, discharge Urology: -burning with urination, -difficulty urinating, -blood in urine, -urinary frequency, -urgency, -incontinence Neurology: -headache, -weakness, -tingling, -numbness, -memory loss, -falls, -dizziness Psychology: -depressed mood, -agitation, -sleep problems Male GU: no testicular mass, pain, no lymph nodes swollen, no swelling, no rash.  Depression screen [facility] 2/9 11/21/2020 09/23/2019  Decreased Interest 0 0  Down, Depressed, Hopeless 0 0  PHQ - 2 Score 0 0        Objective:  BP 130/80   Pulse 78   Ht 6' (1.829 m)   Wt 296 lb 12.8 oz (134.6 kg)   BMI 40.25 kg/m   General appearance: alert, no distress, WD/WN, Caucasian male Skin: Unremarkable HEENT: normocephalic, conjunctiva/corneas normal, sclerae anicteric, PERRLA, EOMi, nares patent, no discharge or erythema, pharynx normal Neck: supple, no lymphadenopathy, no thyromegaly, no masses, normal ROM, no bruits Chest: non tender, normal shape and expansion Heart: RRR, normal S1, S2, no murmurs Lungs: CTA bilaterally, no wheezes, rhonchi, or rales Abdomen: +bs, soft, non tender, non distended, no masses, no hepatomegaly, no splenomegaly, no bruits Back: non tender, normal ROM, no scoliosis Musculoskeletal: upper extremities non tender, no obvious deformity, normal ROM throughout, lower extremities non tender, no obvious deformity, normal ROM throughout Extremities: no edema, no cyanosis, no clubbing Pulses: 2+ symmetric, upper  pulses, normal cap refill Neurological: alert, oriented x 3,  CN2-12 intact, strength normal upper extremities and lower extremities, sensation normal throughout, DTRs 2+ throughout, no cerebellar signs, gait normal Psychiatric: normal affect, behavior normal, pleasant  GU:/rectal -deferred/declined  Diabetic Foot Exam - Simple   Simple Foot Form Diabetic Foot exam was performed with the following findings: Yes 11/21/2020  8:53 AM  Visual Inspection No deformities, no ulcerations, no other skin breakdown bilaterally: Yes Sensation Testing See comments: Yes Pulse Check Posterior Tibialis and Dorsalis pulse intact bilaterally: Yes Comments 1+ pedal pulses, there is some dried blood of his right great toenail medially from where he ripped off some of the toenail otherwise no foot lesions       Assessment and Plan :   Encounter Diagnoses  Name Primary?   Encounter for health maintenance examination in adult Yes   Uncontrolled type 2 diabetes mellitus with complication, without long-term current use of insulin (HCC)    S/P CABG x 4    Obesity, Class II, BMI 35-39.9    Mixed dyslipidemia    Family history of premature CAD    Erectile dysfunction, unspecified erectile dysfunction type    Allergic rhinitis due to pollen, unspecified seasonality  History of COVID-19    Vaccine refused by patient    Stenosis of carotid artery, unspecified laterality    Screening for prostate cancer    Screen for colon cancer    Mild intermittent asthma, unspecified whether complicated     This visit was a preventative care visit, also known as wellness visit or routine physical.   Topics typically include healthy lifestyle, diet, exercise, preventative care, vaccinations, sick and well care, proper use of emergency dept and after hours care, as well as other concerns.     Recommendations: Continue to return yearly for your annual wellness and preventative care visits.  This gives [place] a chance to discuss healthy lifestyle, exercise, vaccinations, review your  chart record, and perform screenings where appropriate.  I recommend you see your eye doctor yearly for routine vision care.  I recommend you see your dentist yearly for routine dental care including hygiene visits twice yearly.   Vaccination recommendations were reviewed  There is no immunization history on file for this patient.  Vaccines recommended:   Tetanus booster every 10 years, shingles vaccine 2 shots 2 months apart, pneumonia vaccine every 5 years, flu shot yearly in the fall  He declines vaccines   Screening for cancer: Colon cancer screening: He has cologard kit at home.  We discussed doing the screen and turning it in.  I encouraged him to go ahead and take care of the colon cancer screen.  He is a little ambivalent whether he want to even pursue screening for therapy if something were to be found  We discussed PSA, prostate exam, and prostate cancer screening risks/benefits.     Skin cancer screening: Check your skin regularly for new changes, growing lesions, or other lesions of concern Come in for evaluation if you have skin lesions of concern.  Lung cancer screening: If you have a greater than 20 pack year history of tobacco use, then you may qualify for lung cancer screening with a chest CT scan.   Please call your insurance company to inquire about coverage for this test.  We currently don't have screenings for other cancers besides breast, cervical, colon, and lung cancers.  If you have a strong family history of cancer or have other cancer screening concerns, please let me know.    Bone health: Get at least 150 minutes of aerobic exercise weekly Get weight bearing exercise at least once weekly Bone density test:  A bone density test is an imaging test that uses a type of X-ray to measure the amount of calcium and other minerals in your bones. The test may be used to diagnose or screen you for a condition that causes weak or thin bones (osteoporosis),  predict your risk for a broken bone (fracture), or determine how well your osteoporosis treatment is working. The bone density test is recommended for females 73 and older, or females or males <44 if certain risk factors such as thyroid disease, long term use of steroids such as for asthma or rheumatological issues, vitamin D deficiency, estrogen deficiency, family history of osteoporosis, self or family history of fragility fracture in first degree relative.    Heart health: Get at least 150 minutes of aerobic exercise weekly Limit alcohol It is important to maintain a healthy blood pressure and healthy cholesterol numbers  Heart disease screening: Screening for heart disease includes screening for blood pressure, fasting lipids, glucose/diabetes screening, BMI height to weight ratio, reviewed of smoking status, physical activity, and diet.  Goals include blood pressure 120/80 or less, maintaining a healthy lipid/cholesterol profile, preventing diabetes or keeping diabetes numbers under good control, not smoking or using tobacco products, exercising most days per week or at least 150 minutes per week of exercise, and eating healthy variety of fruits and vegetables, healthy oils, and avoiding unhealthy food choices like fried food, fast food, high sugar and high cholesterol foods.    Other tests may possibly include EKG test, CT coronary calcium score, echocardiogram, exercise treadmill stress test.   Continue routine follow up with cardiology.   Medical care options: I recommend you continue to seek care here first for routine care.  We try really hard to have available appointments Monday through Friday daytime hours for sick visits, acute visits, and physicals.  Urgent care should be used for after hours and weekends for significant issues that cannot wait till the next day.  The emergency department should be used for significant potentially life-threatening emergencies.  The emergency  department is expensive, can often have long wait times for less significant concerns, so try to utilize primary care, urgent care, or telemedicine when possible to avoid unnecessary trips to the emergency department.  Virtual visits and telemedicine have been introduced since the pandemic started in 2020, and can be convenient ways to receive medical care.  We offer virtual appointments as well to assist you in a variety of options to seek medical care.    Separate significant issues discussed: Diabetes-routine labs today, continue routine medication, advised glucometer testing, yearly eye doctor visit.  He is due to see the eye doctor now  High cholesterol-continue Lipitor and aspirin daily  Obesity-work on losing weight through healthy diet and exercise     Emet was seen today for fasting cpe.  Diagnoses and all orders for this visit:  Encounter for health maintenance examination in adult -     Microalbumin/Creatinine Ratio, Urine -     PSA -     Comprehensive metabolic panel -     CBC -     Hemoglobin A1c -     Lipid panel  Uncontrolled type 2 diabetes mellitus with complication, without long-term current use of insulin (HCC) -     Microalbumin/Creatinine Ratio, Urine -     Comprehensive metabolic panel -     Hemoglobin A1c  S/P CABG x 4  Obesity, Class II, BMI 35-39.9  Mixed dyslipidemia -     Comprehensive metabolic panel -     Lipid panel  Family history of premature CAD  Erectile dysfunction, unspecified erectile dysfunction type  Allergic rhinitis due to pollen, unspecified seasonality  History of COVID-19  Vaccine refused by patient  Stenosis of carotid artery, unspecified laterality  Screening for prostate cancer -     PSA  Screen for colon cancer  Mild intermittent asthma, unspecified whether complicated    Follow-up pending labs, yearly for physical

## 2020-11-22 LAB — COMPREHENSIVE METABOLIC PANEL
ALT: 24 IU/L (ref 0–44)
AST: 34 IU/L (ref 0–40)
Albumin/Globulin Ratio: 1.7 (ref 1.2–2.2)
Albumin: 4.3 g/dL (ref 3.8–4.8)
Alkaline Phosphatase: 92 IU/L (ref 44–121)
BUN/Creatinine Ratio: 16 (ref 10–24)
BUN: 15 mg/dL (ref 8–27)
Bilirubin Total: 1.6 mg/dL — ABNORMAL HIGH (ref 0.0–1.2)
CO2: 20 mmol/L (ref 20–29)
Calcium: 9.1 mg/dL (ref 8.6–10.2)
Chloride: 102 mmol/L (ref 96–106)
Creatinine, Ser: 0.94 mg/dL (ref 0.76–1.27)
Globulin, Total: 2.5 g/dL (ref 1.5–4.5)
Glucose: 200 mg/dL — ABNORMAL HIGH (ref 65–99)
Potassium: 4.7 mmol/L (ref 3.5–5.2)
Sodium: 136 mmol/L (ref 134–144)
Total Protein: 6.8 g/dL (ref 6.0–8.5)
eGFR: 92 mL/min/{1.73_m2} (ref >59)

## 2020-11-22 LAB — CBC
Hematocrit: 47.7 % (ref 37.5–51.0)
Hemoglobin: 16.5 g/dL (ref 13.0–17.7)
MCH: 32.2 pg (ref 26.6–33.0)
MCHC: 34.6 g/dL (ref 31.5–35.7)
MCV: 93 fL (ref 79–97)
Platelets: 185 10*3/uL (ref 150–450)
RBC: 5.12 x10E6/uL (ref 4.14–5.80)
RDW: 12 % (ref 11.6–15.4)
WBC: 6.8 10*3/uL (ref 3.4–10.8)

## 2020-11-22 LAB — LIPID PANEL
Chol/HDL Ratio: 2.7 ratio (ref 0.0–5.0)
Cholesterol, Total: 107 mg/dL (ref 100–199)
HDL: 40 mg/dL (ref >39)
LDL Chol Calc (NIH): 41 mg/dL (ref 0–99)
Triglycerides: 154 mg/dL — ABNORMAL HIGH (ref 0–149)
VLDL Cholesterol Cal: 26 mg/dL (ref 5–40)

## 2020-11-22 LAB — HEMOGLOBIN A1C
Est. average glucose Bld gHb Est-mCnc: 212 mg/dL
Hgb A1c MFr Bld: 9 % — ABNORMAL HIGH (ref 4.8–5.6)

## 2020-11-22 LAB — MICROALBUMIN / CREATININE URINE RATIO
Creatinine, Urine: 122.8 mg/dL
Microalb/Creat Ratio: 5 mg/g creat (ref 0–29)
Microalbumin, Urine: 6.7 ug/mL

## 2020-11-22 LAB — PSA: Prostate Specific Ag, Serum: 1.1 ng/mL (ref 0.0–4.0)

## 2020-11-23 ENCOUNTER — Other Ambulatory Visit: Payer: Self-pay | Admitting: Medical

## 2020-11-23 MED ORDER — ASPIRIN EC 81 MG PO TBEC: 81.0000 mg | DELAYED_RELEASE_TABLET | Freq: Every day | ORAL | 3 refills | Status: DC

## 2020-11-23 MED ORDER — DAPAGLIFLOZIN PROPANEDIOL 10 MG PO TABS
10.0000 mg | ORAL_TABLET | Freq: Every day | ORAL | 0 refills | Status: DC
Start: 2020-11-23 — End: 2021-05-29

## 2020-12-13 ENCOUNTER — Other Ambulatory Visit: Payer: Self-pay | Admitting: Medical

## 2020-12-13 ENCOUNTER — Telehealth: Payer: Self-pay | Admitting: Medical

## 2020-12-13 MED ORDER — ALBUTEROL SULFATE HFA 108 (90 BASE) MCG/ACT IN AERS: INHALATION_SPRAY | RESPIRATORY_TRACT | 0 refills | Status: DC

## 2020-12-13 NOTE — Telephone Encounter (Signed)
Left message for pt to call me back 

## 2020-12-13 NOTE — Telephone Encounter (Signed)
Pt was notified. Taking only using inhaler like twice a week unless its really hot he may need it more often

## 2020-12-13 NOTE — Telephone Encounter (Signed)
Pharmacy sent refill request for albuterol please send to the [facility]  [facility]) - [place], [place] - [phone] [address]

## 2020-12-15 ENCOUNTER — Telehealth: Payer: Self-pay

## 2020-12-15 NOTE — Telephone Encounter (Signed)
Recv'd fax that Ventolin not covered Called [facility] and authorized Proair as ir was covered alternative.

## 2020-12-16 ENCOUNTER — Telehealth: Payer: Self-pay

## 2020-12-16 ENCOUNTER — Other Ambulatory Visit: Payer: Self-pay | Admitting: Medical

## 2020-12-16 NOTE — Telephone Encounter (Signed)
[facility] rx is requesting to fill pt albuterol. Please advise [place]

## 2020-12-21 ENCOUNTER — Encounter: Payer: Self-pay | Admitting: Internal Medicine

## 2020-12-24 NOTE — Telephone Encounter (Signed)
done

## 2021-02-09 ENCOUNTER — Other Ambulatory Visit: Payer: Self-pay | Admitting: Medical

## 2021-04-13 ENCOUNTER — Telehealth: Payer: Self-pay | Admitting: Internal Medicine

## 2021-04-13 NOTE — Telephone Encounter (Signed)
Left message for pt to call back due for Diabetes as soon as he can. Last A1c was 9

## 2021-05-15 ENCOUNTER — Other Ambulatory Visit: Payer: Self-pay | Admitting: Medical

## 2021-05-18 ENCOUNTER — Telehealth: Payer: Self-pay | Admitting: Medical

## 2021-05-18 NOTE — Telephone Encounter (Signed)
Pt daughter called and is requesting a refill for pt metformin please send to the [employer]) - [place], [place] - [address] [place]

## 2021-05-19 MED ORDER — METFORMIN HCL 1000 MG PO TABS: 1000.0000 mg | ORAL_TABLET | Freq: Two times a day (BID) | ORAL | 0 refills | Status: DC

## 2021-05-19 NOTE — Telephone Encounter (Signed)
done

## 2021-05-29 ENCOUNTER — Encounter: Payer: Self-pay | Admitting: Medical

## 2021-05-29 ENCOUNTER — Other Ambulatory Visit: Payer: Self-pay

## 2021-05-29 ENCOUNTER — Ambulatory Visit (INDEPENDENT_AMBULATORY_CARE_PROVIDER_SITE_OTHER): Payer: BC Managed Care – PPO | Admitting: Medical

## 2021-05-29 VITALS — BP 130/80 | HR 72 | Wt 292.6 lb

## 2021-05-29 DIAGNOSIS — Z1211 Encounter for screening for malignant neoplasm of colon: Secondary | ICD-10-CM | POA: Diagnosis not present

## 2021-05-29 DIAGNOSIS — Z125 Encounter for screening for malignant neoplasm of prostate: Secondary | ICD-10-CM

## 2021-05-29 DIAGNOSIS — E669 Obesity, unspecified: Secondary | ICD-10-CM

## 2021-05-29 DIAGNOSIS — Z951 Presence of aortocoronary bypass graft: Secondary | ICD-10-CM

## 2021-05-29 DIAGNOSIS — Z8249 Family history of ischemic heart disease and other diseases of the circulatory system: Secondary | ICD-10-CM

## 2021-05-29 DIAGNOSIS — Z2820 Immunization not carried out because of patient decision for unspecified reason: Secondary | ICD-10-CM

## 2021-05-29 DIAGNOSIS — E782 Mixed hyperlipidemia: Secondary | ICD-10-CM

## 2021-05-29 DIAGNOSIS — E1165 Type 2 diabetes mellitus with hyperglycemia: Secondary | ICD-10-CM

## 2021-05-29 LAB — POCT GLYCOSYLATED HEMOGLOBIN (HGB A1C): Hemoglobin A1C: 8 % — AB (ref 4.0–5.6)

## 2021-05-29 MED ORDER — METFORMIN HCL ER 750 MG PO TB24: 750.0000 mg | ORAL_TABLET | Freq: Two times a day (BID) | ORAL | 1 refills | Status: DC

## 2021-05-29 MED ORDER — RYBELSUS 7 MG PO TABS: 7.0000 mg | ORAL_TABLET | Freq: Every day | ORAL | 2 refills | Status: DC

## 2021-05-29 NOTE — Patient Instructions (Signed)
I reviewed labs that he had in 2022.  Blood counts, LDL cholesterol, prostate PSA and microalbumin were all okay in July 2022.  Triglycerides are slightly elevated at 154.  Hemoglobin A1c has not been at goal this past year. We need to get your sugars and weight under better control.    Diabetes You should have a diabetic eye exam yearly and ask the eye doctor to send [place] a copy of that record It is recommended that you have a lower extremity blood flow screen called an ABI.  This is done at the vascular office to screen for blood flow issues in the leg.  Diabetics are high risk for this I wrote a prescription for glucometer and supplies today.  Begin checking glucose daily fasting Goal is <130 fating in the morning . We would like your morning sugars to be between 80-130. Your HgbA1C was 8% today, not at goal . Continue Metformin but lets change to 773m XR twice daily so you may have less diarrhea Begin Rybelsus tablet.   This is a daily medication to help with sugars, weight loss.  If not covered by insurance we may have to switch to a weekly injection such as Ozempic.  Common side effects could be nausea or acid reflux.  You discontinued FWilder Gladedue to excessive urination Lets recheck in 3-4 months.   Dyslipidemia Continue Aspirin 84mdaily Continue Atorvastatin 801maily for cholesterol  Recommendations for improving lipids:  Foods TO AVOID or limit - fried foods, high sugar foods, white bread, enriched flour, fast food, red meat, large amounts of cheese, processed foods such as little debbie cakes, cookies, pies, donuts, for example  Foods TO INCLUDE in the diet - whole grains such as whole grain pasta, whole grain bread, barley, steel cut oatmeal (not instant oatmeal), avocado, fish, green leafy vegetables, nuts, increased fiber in diet, and using olive oil in small amounts for cooking or as salad dressing vinaigrette.      CAD, history of CABG, history of NSTEMI, aortic  atherosclerosis Continue Aspirin 79m45mily Continue Atorvastatin 80mg10mly for cholesterol   Screening for colon cancer I don't see you have had colon cancer screening You have 2 choices, Cologard stool test kit or Colonoscopy Let me know which you want to do Check insurance coverage for Cologard vs Colonoscopy   Your are not up-to-date on vaccines.   You did not want to even talk about vaccines.     Prostate cancer screen PSA was done 11/2020, normal.

## 2021-05-29 NOTE — Progress Notes (Signed)
Subjective:  Joshua Howell is a 61 y.o. male who presents for Chief Complaint  Patient presents with   diabetes    Diabetes, no concerns, declines any vaccines and has not had eye exam     Here for med check.  His medical history includes history of NSTEMI, diabetes, obesity, dyslipidemia, family history of premature CAD, status post CABG x4, ED, carotid artery stenosis, prior hypoxemia with COVID infection, mild asthma, and at times noncompliance.  Diabetes He notes his glucometer is not working properly, so not checking glucose.  Compliant with Metformin 1031m BID.  Still has some diarrhea with metformin.    Was using FIranbut was urinating every 5 minutes.   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  No other aggravating or relieving factors.    No other c/o.  Past Medical History:  Diagnosis Date   CAD (coronary artery disease)    a. NSTEMI 01/2017 s/p CABG.   Carotid arterial disease (HCC)    a. mild 1-39% 01/2017.   Dyslipidemia    Hx of CABG    Hyperlipidemia    Migraine    "only in [employer]" (01/23/2017)   Morbid obesity ([place]    NSTEMI (non-ST elevated myocardial infarction) ([place] 01/23/2017   type 2 diabetes 07/2016   Current Outpatient Medications on File Prior to Visit  Medication Sig Dispense Refill   albuterol (VENTOLIN HFA) 108 (90 Base) MCG/ACT inhaler USE 2 INHALATIONS BY MOUTH  EVERY 6 HOURS AS NEEDED FOR WHEEZE OR SHORTNESS OF  BREATH 25.5 g 1   aspirin EC 81 MG tablet Take 1 tablet (81 mg total) by mouth daily. 90 tablet 3   atorvastatin (LIPITOR) 80 MG tablet TAKE 1 TABLET BY MOUTH  DAILY AT 6 PM. 90 tablet 3   No current facility-administered medications on file prior to visit.     The following portions of the patient's history were reviewed and updated as appropriate: allergies, current  medications, past family history, past medical history, past social history, past surgical history and problem list.  ROS Otherwise as in subjective above  Objective: BP 130/80    Pulse 72    Wt 292 lb 9.6 oz (132.7 kg)    BMI 39.68 kg/m   Wt Readings from Last 3 Encounters:  05/29/21 292 lb 9.6 oz (132.7 kg)  11/21/20 296 lb 12.8 oz (134.6 kg)  05/03/20 279 lb 9.6 oz (126.8 kg)   BP Readings from Last 3 Encounters:  05/29/21 130/80  11/21/20 130/80  05/03/20 132/80    General appearance: alert, no distress, well developed, well nourished Neck: supple, no lymphadenopathy, no thyromegaly, no masses Heart: RRR, normal S1, S2, no murmurs Lungs: CTA bilaterally, no wheezes, rhonchi, or rales Pulses: 2+ radial pulses, 2+ pedal pulses, normal cap refill Ext: no edema     Assessment: Encounter Diagnoses  Name Primary?   Uncontrolled type 2 diabetes mellitus with hyperglycemia (HCC) Yes   Vaccine refused by patient    Screen for colon cancer    Screening for prostate cancer    S/P CABG x 4    Obesity, Class II, BMI 35-39.9    Mixed dyslipidemia    Family history of premature CAD      Plan: I reviewed labs that he had in 2022.  Blood counts, LDL cholesterol, prostate PSA and microalbumin were all okay in  July 2022.  Triglycerides are slightly elevated at 154.  Hemoglobin A1c has not been at goal this past year. We need to get your sugars and weight under better control.    Diabetes You should have a diabetic eye exam yearly and ask the eye doctor to send [place] a copy of that record It is recommended that you have a lower extremity blood flow screen called an ABI.  This is done at the vascular office to screen for blood flow issues in the leg.  Diabetics are high risk for this I wrote a prescription for glucometer and supplies today.  Begin checking glucose daily fasting Goal is <130 fating in the morning . We would like your morning sugars to be between 80-130. Your HgbA1C  was 8% today, not at goal . Continue Metformin but lets change to 71m XR twice daily so you may have less diarrhea Begin Rybelsus tablet.   This is a daily medication to help with sugars, weight loss.  If not covered by insurance we may have to switch to a weekly injection such as Ozempic.  Common side effects could be nausea or acid reflux.  You discontinued FWilder Gladedue to excessive urination Lets recheck in 3-4 months.   Dyslipidemia Continue Aspirin 896mdaily Continue Atorvastatin 8075maily for cholesterol  Recommendations for improving lipids:  Foods TO AVOID or limit - fried foods, high sugar foods, white bread, enriched flour, fast food, red meat, large amounts of cheese, processed foods such as little debbie cakes, cookies, pies, donuts, for example  Foods TO INCLUDE in the diet - whole grains such as whole grain pasta, whole grain bread, barley, steel cut oatmeal (not instant oatmeal), avocado, fish, green leafy vegetables, nuts, increased fiber in diet, and using olive oil in small amounts for cooking or as salad dressing vinaigrette.      CAD, history of CABG, history of NSTEMI, aortic atherosclerosis Continue Aspirin 27m52mily Continue Atorvastatin 80mg36mly for cholesterol   Screening for colon cancer I don't see you have had colon cancer screening You have 2 choices, Cologard stool test kit or Colonoscopy Let me know which you want to do Check insurance coverage for Cologard vs Colonoscopy   Your are not up-to-date on vaccines.   You did not want to even talk about vaccines.     Prostate cancer screen PSA was done 11/2020, normal.   FredrSteadmanseen today for diabetes.  Diagnoses and all orders for this visit:  Uncontrolled type 2 diabetes mellitus with hyperglycemia (HCC) -     HgB A1c  Vaccine refused by patient  Screen for colon cancer  Screening for prostate cancer  S/P CABG x 4  Obesity, Class II, BMI 35-39.9  Mixed dyslipidemia  Family  history of premature CAD  Other orders -     metFORMIN (GLUCOPHAGE XR) 750 MG 24 hr tablet; Take 1 tablet (750 mg total) by mouth in the morning and at bedtime. -     Semaglutide (RYBELSUS) 7 MG TABS; Take 7 mg by mouth daily.    Follow up: 3-4 mo for diabetes

## 2021-07-06 ENCOUNTER — Encounter: Payer: Self-pay | Admitting: Medical

## 2021-07-06 ENCOUNTER — Ambulatory Visit: Payer: BC Managed Care – PPO | Admitting: Medical

## 2021-07-06 ENCOUNTER — Ambulatory Visit
Admission: RE | Admit: 2021-07-06 | Discharge: 2021-07-06 | Disposition: A | Discharge status: Home / Self Care | Payer: BC Managed Care – PPO | Source: Ambulatory Visit | Attending: Medical | Admitting: Medical

## 2021-07-06 ENCOUNTER — Other Ambulatory Visit: Payer: Self-pay | Admitting: Medical

## 2021-07-06 ENCOUNTER — Other Ambulatory Visit: Payer: Self-pay

## 2021-07-06 VITALS — BP 120/68 | HR 84 | Resp 20 | Ht 72.0 in | Wt 284.8 lb

## 2021-07-06 DIAGNOSIS — R110 Nausea: Secondary | ICD-10-CM

## 2021-07-06 DIAGNOSIS — R519 Headache, unspecified: Secondary | ICD-10-CM | POA: Diagnosis not present

## 2021-07-06 DIAGNOSIS — G4489 Other headache syndrome: Secondary | ICD-10-CM

## 2021-07-06 MED ORDER — ONDANSETRON HCL 4 MG PO TABS: 4.0000 mg | ORAL_TABLET | Freq: Three times a day (TID) | ORAL | 0 refills | Status: DC | PRN

## 2021-07-06 MED ORDER — AMOXICILLIN 500 MG PO TABS: ORAL_TABLET | ORAL | 0 refills | Status: DC

## 2021-07-06 MED ORDER — OXYCODONE-ACETAMINOPHEN 7.5-325 MG PO TABS: 1.0000 | ORAL_TABLET | ORAL | 0 refills | Status: DC | PRN

## 2021-07-06 NOTE — Patient Instructions (Signed)
I sent pain medicine nausea medicine to your pharmacy  Go for CT head.

## 2021-07-06 NOTE — Progress Notes (Signed)
Subjective: Chief Complaint  Patient presents with   Headache    Woke up 5-5:30am yesterday with HA on left side, he ended up having to vomit-he says it was worst HA of his life. He took the day off and rested, went away around 9pm. Was fine this am and went to work, around noon started again into the left eye again and caused him to vomit again.    Here for severe headache  He has a medical history significant for coronary artery disease, carotid artery disease, dyslipidemia, history of CABG, hyperlipidemia, migraine early on in life, morbid obesity, type 2 diabetes, sometimes noncompliance with medication or recommendations, and family history of brain aneurysm.  States is the worst headache of his life.  Started yesterday morning severe behind his left eye.  Happened early yesterday morning.  Vomited 3 times yesterday.  Took the day off and rested.  Finally the headache went away last nigh.  This morning seem to be okay but then by the midmorning to lunchtime headache came on again severe.  He feels nauseated and is dry heaving some.  No numbness, tingling, weakness, vision change, hearing change, no slurred speech, no fall, no ataxia, no incontinence, no fever body aches or chills.  No chest pain or difficulty breathing.  He denies excessive caffeine use.  He usually eats 2 or 3 times per day so no recent skipped meals.  No recent head injury.  No recent illness.  No sick contacts.  No photophobia or phonophobia.  The headache is behind the left eye pounding but sharp at times.  He took 2 ibuprofens this morning without much improvement  No other aggravating or relieving factors. No other complaint.  Past Medical History:  Diagnosis Date   CAD (coronary artery disease)    a. NSTEMI 01/2017 s/p CABG.   Carotid arterial disease (HCC)    a. mild 1-39% 01/2017.   Dyslipidemia    Hx of CABG    Hyperlipidemia    Migraine    "only in [employer] School" (01/23/2017)   Morbid obesity (HCC)     NSTEMI (non-ST elevated myocardial infarction) (HCC) 01/23/2017   type 2 diabetes 07/2016   Current Outpatient Medications on File Prior to Visit  Medication Sig Dispense Refill   aspirin EC 81 MG tablet Take 1 tablet (81 mg total) by mouth daily. 90 tablet 3   atorvastatin (LIPITOR) 80 MG tablet TAKE 1 TABLET BY MOUTH  DAILY AT 6 PM. 90 tablet 3   metFORMIN (GLUCOPHAGE XR) 750 MG 24 hr tablet Take 1 tablet (750 mg total) by mouth in the morning and at bedtime. 180 tablet 1   Semaglutide (RYBELSUS) 7 MG TABS Take 7 mg by mouth daily. 30 tablet 2   albuterol (VENTOLIN HFA) 108 (90 Base) MCG/ACT inhaler USE 2 INHALATIONS BY MOUTH  EVERY 6 HOURS AS NEEDED FOR WHEEZE OR SHORTNESS OF  BREATH (Patient not taking: Reported on 07/06/2021) 25.5 g 1   No current facility-administered medications on file prior to visit.     Family History  Problem Relation Age of Onset   Gallbladder disease Mother    Aneurysm Mother        brain   Stroke Mother    Heart disease Father 71       3 vessel ABG   Heart disease Brother 72       MI   Other Brother        died of hemorrhage in head after fall  ROS as in subjective    Objective: BP 120/68    Pulse 84    Resp 20    Ht 6' (1.829 m)    Wt 284 lb 12.8 oz (129.2 kg)    BMI 38.63 kg/m   General appearence: alert, no distress, WD/WN, obese white male HEENT: normocephalic, sclerae anicteric, PERRLA, EOMi, nares patent, no discharge or erythema, pharynx normal, air-fluid levels behind the eardrums but otherwise TMs and canals normal, Oral cavity: MMM, no lesions Neck: supple, nontender, normal range of motion, no lymphadenopathy, no thyromegaly, no masses, no bruits Heart: RRR, normal S1, S2, no murmurs Lungs: CTA bilaterally, no wheezes, rhonchi, or rales Musculoskeletal: nontender, no swelling, no obvious deformity Extremities: no edema, no cyanosis, no clubbing Pulses: 2+ symmetric, upper and lower extremities, normal cap refill Neurological:  alert, oriented x 3, CN2-12 intact, strength normal upper extremities and lower extremities, sensation normal throughout, DTRs 2+ throughout, no cerebellar signs, gait normal Psychiatric: normal affect, behavior normal, pleasant      Assessment: Encounter Diagnoses  Name Primary?   Severe headache Yes   Other headache syndrome    Nausea      Plan: We discussed symptoms and concerns.  He seems to be in pain but exam otherwise seems to be relatively unremarkable  We discussed differential.  He is quite concerned given his mother's history of brain aneurysm.  His vital signs are okay today.  He notes recent blood sugars are under control.  No recent elevated blood pressures at home.  We were able to get him scheduled for stat CT walk-in and he will go directly when he leaves here today.  Prescriptions below to help with pain and nausea.  Discussed risk and benefits of medication.  Discussed likely sedation with the pain medication.  Advised to go home and rest in a cool dark quiet place.  If worsening symptoms in the meantime go to the hospital or call 911    Arian was seen today for headache.  Diagnoses and all orders for this visit:  Severe headache  Other headache syndrome -     CT HEAD WO CONTRAST ( ); Future  Nausea  Other orders -     oxyCODONE-acetaminophen (PERCOCET) 7.5-325 MG tablet; Take 1 tablet by mouth every 4 (four) hours as needed for severe pain. -     ondansetron (ZOFRAN) 4 MG tablet; Take 1 tablet (4 mg total) by mouth every 8 (eight) hours as needed for nausea or vomiting.    F/u pending CT results.

## 2021-07-26 ENCOUNTER — Other Ambulatory Visit: Payer: Self-pay | Admitting: Internal Medicine

## 2021-07-26 DIAGNOSIS — Z1211 Encounter for screening for malignant neoplasm of colon: Secondary | ICD-10-CM

## 2021-08-09 ENCOUNTER — Other Ambulatory Visit: Payer: Self-pay | Admitting: Medical

## 2021-08-31 DIAGNOSIS — Z1211 Encounter for screening for malignant neoplasm of colon: Secondary | ICD-10-CM | POA: Diagnosis not present

## 2021-09-11 LAB — COLOGUARD: COLOGUARD: NEGATIVE

## 2022-01-17 ENCOUNTER — Encounter: Payer: Self-pay | Admitting: Internal Medicine

## 2022-02-01 ENCOUNTER — Ambulatory Visit: Payer: BC Managed Care – PPO | Admitting: Medical

## 2022-02-01 ENCOUNTER — Ambulatory Visit
Admission: RE | Admit: 2022-02-01 | Discharge: 2022-02-01 | Disposition: A | Discharge status: Home / Self Care | Payer: BC Managed Care – PPO | Source: Ambulatory Visit | Attending: Medical | Admitting: Medical

## 2022-02-01 VITALS — BP 120/80 | HR 60 | Temp 97.0°F | Wt 292.0 lb

## 2022-02-01 DIAGNOSIS — M79601 Pain in right arm: Secondary | ICD-10-CM

## 2022-02-01 DIAGNOSIS — R2231 Localized swelling, mass and lump, right upper limb: Secondary | ICD-10-CM

## 2022-02-01 NOTE — Progress Notes (Signed)
Subjective:  Joshua Howell is a 61 y.o. male who presents for Chief Complaint  Patient presents with   swelling of elbow    Woke up Tuesday and right elbow is tender and swollen and pain going down into underarm. Declines flu shot      Here for swelling and pain of the right arm.  He noticed this 2 days ago when he woke up.  He has tenderness and swelling in the right arm but no redness, no warmth, no obvious skin lesion or bug bite.  He has not had any injury or trauma.  No recent long travel, no recent break in the skin from injury or bug bite.  No fever no body aches or chills.  No nausea.  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  His arm is on an armrest at times but again no recent injury or trauma.  No other aggravating or relieving factors.    No other c/o.  Past Medical History:  Diagnosis Date   CAD (coronary artery disease)    a. NSTEMI 01/2017 s/p CABG.   Carotid arterial disease (HCC)    a. mild 1-39% 01/2017.   Dyslipidemia    Hx of CABG    Hyperlipidemia    Migraine    "only in [employer]" (01/23/2017)   Morbid obesity ([place])    NSTEMI (non-ST elevated myocardial infarction) ([place]) 01/23/2017   type 2 diabetes 07/2016   Current Outpatient Medications on File Prior to Visit  Medication Sig Dispense Refill   albuterol (VENTOLIN HFA) 108 (90 Base) MCG/ACT inhaler USE 2 INHALATIONS BY MOUTH EVERY 6 HOURS AS NEEDED FOR WHEEZING  OR SHORTNESS OF BREATH 34 g 0   aspirin EC 81 MG tablet Take 1 tablet (81 mg total) by mouth daily. 90 tablet 3   atorvastatin (LIPITOR) 80 MG tablet TAKE 1 TABLET BY MOUTH  DAILY AT 6 PM. 90 tablet 3   metFORMIN (GLUCOPHAGE XR) 750 MG 24 hr tablet Take 1 tablet (750 mg total) by mouth in the morning and at bedtime. 180 tablet 1   No current facility-administered medications on file prior to visit.     The following portions of the patient's history were reviewed and updated as appropriate: allergies, current  medications, past family history, past medical history, past social history, past surgical history and problem list.  ROS Otherwise as in subjective above  Objective: BP 120/80   Pulse 60   Temp (!) 97 F (36.1 C)   Wt 292 lb (132.5 kg)   BMI 39.60 kg/m   General appearance: alert, no distress, well developed, well nourished Right upper arm just proximal to the elbow region medially over the distal medial aspect of the medial deep head of the tricep.  There seems to be a tender roundish mass that may be overlying the tricep.  No obvious fluctuance, no induration, no warmth, no superficial skin redness.  Muscle contraction on motion does not seem to change the mass suggesting that this could be overlying the muscle or deep soft tissue mass. Arms neurovascularly intact, range of motion normal   Assessment: Encounter Diagnoses  Name Primary?   Pain of right upper extremity Yes   Mass of arm, right      Plan: Unclear etiology.  We discussed the symptoms and possible causes.  We will schedule for a soft tissue ultrasound.  Differential includes myositis, tendinitis, possible infection or early abscess, muscle mass or tumor, or  other.  I suspect muscle inflammation or tendinitis more likely diagnosis.   Joshua Howell was seen today for swelling of elbow.  Diagnoses and all orders for this visit:  Pain of right upper extremity -     [place] RT UPPER EXTREM LTD SOFT TISSUE NON VASCULAR; Future  Mass of arm, right -     [place] RT UPPER EXTREM LTD SOFT TISSUE NON VASCULAR; Future    Follow up: pending ultrasound

## 2022-02-05 ENCOUNTER — Other Ambulatory Visit: Payer: Self-pay | Admitting: Internal Medicine

## 2022-02-05 ENCOUNTER — Other Ambulatory Visit: Payer: Self-pay | Admitting: Medical

## 2022-02-05 DIAGNOSIS — M79601 Pain in right arm: Secondary | ICD-10-CM

## 2022-02-05 DIAGNOSIS — R2231 Localized swelling, mass and lump, right upper limb: Secondary | ICD-10-CM

## 2022-02-05 MED ORDER — AMOXICILLIN-POT CLAVULANATE 875-125 MG PO TABS: 1.0000 | ORAL_TABLET | Freq: Two times a day (BID) | ORAL | 0 refills | Status: DC

## 2022-02-06 ENCOUNTER — Ambulatory Visit: Payer: BC Managed Care – PPO | Admitting: Sports Medicine

## 2022-02-06 ENCOUNTER — Ambulatory Visit: Payer: Self-pay

## 2022-02-06 ENCOUNTER — Encounter: Payer: Self-pay | Admitting: Sports Medicine

## 2022-02-06 ENCOUNTER — Other Ambulatory Visit: Payer: Self-pay | Admitting: Medical

## 2022-02-06 VITALS — BP 125/83 | HR 66 | Ht 72.0 in | Wt 290.0 lb

## 2022-02-06 DIAGNOSIS — M79601 Pain in right arm: Secondary | ICD-10-CM | POA: Diagnosis not present

## 2022-02-06 DIAGNOSIS — M7989 Other specified soft tissue disorders: Secondary | ICD-10-CM | POA: Diagnosis not present

## 2022-02-06 NOTE — Progress Notes (Signed)
1 week ago; woke up to a swollen right elbow Saw PCP and an [place] was ordered Complaining of pain along the bicep Was started on amoxicillin as a precaution for infection No injury to the arm

## 2022-02-06 NOTE — Progress Notes (Signed)
Joshua Howell - 61 y.o. male MRN [id number]  Date of birth: 1960-05-22  Office Visit Note: Visit Date: 02/06/2022 PCP: Carlena Hurl, PA-C Referred by: Carlena Hurl, PA-C  Subjective: Chief Complaint  Patient presents with   Right Elbow - Pain   HPI: Joshua Howell is a pleasant 61 y.o. male who presents today for right arm palpable mass and swelling.  Patient awoke Tuesday morning with a painful mass of the distal medial humerus just proximal to the medial epicondyle.  He denies any injury or specific inciting event.  He denies any redness or surrounding cellulitis but did feel a palpable nodule with surrounding swelling.  He saw his PCP who did order a upper extremity ultrasound and did put him on Augmentin which she started yesterday.  Denies any fever or chills.  He has full range of motion about the elbow.  Patient denies any malignancy history.  Ultrasound did show a subtle lesion in the subcutaneous fat measuring 1.7 x 0.9 x 1.4 cm, hypoechoic complex cystic like structure.  Pertinent ROS were reviewed with the patient and found to be negative unless otherwise specified above in HPI.   Assessment & Plan: Visit Diagnoses:  1. Mass of soft tissue of right upper extremity   2. Right arm pain    Plan: I evaluated "Joshua Howell" and reviewed his ultrasound with him today as well as a portable bedside ultrasound.  According to him, the palpable mass has decreased in size, although it is still tender and present.  He has only been on the Augmentin for 1 day thus far, but denies any fever chills, redness or signs of clinical infection.  We discussed 2 options such as watchful waiting and seeing how this does over the next 2-3 weeks versus urgent MRI to better quantify the degree of the soft tissue mass to rule out tumor, abscess, etc.  Through shared decision-making, he wishes to proceed with MRI for quantification of the soft tissue mass.  I will call him with the results to  discuss next steps moving forward.  He is to continue completion of his Augmentin course, return precautions provided.  Evaluation and ultrasound favors possible swollen lymph node versus soft tissue infection, however will await for MRI results.  Follow-up: Will call with MRI results   Meds & Orders: No orders of the defined types were placed in this encounter.   Orders Placed This Encounter  Procedures   [place]   MR HUMERUS RIGHT W WO CONTRAST     Procedures: No procedures performed      Clinical History: No specialty comments available.  He reports that he has quit smoking. His smoking use included cigarettes. He has never used smokeless tobacco.  Recent Labs    05/29/21 0845  HGBA1C 8.0*    Objective:   Vital Signs: BP 125/83   Pulse 66   Ht 6' (1.829 m)   Wt 290 lb (131.5 kg)   BMI 39.33 kg/m   Physical Exam  Gen: Well-appearing, in no acute distress; non-toxic CV: Regular Rate. Well-perfused. Warm.  Resp: Breathing unlabored on room air; no wheezing. Psych: Fluid speech in conversation; appropriate affect; normal thought process Neuro: Sensation intact throughout. No gross coordination deficits.   Ortho Exam - Right arm/elbow: There is a palpable firm, yet mobile soft tissue mass of the distal medial humerus just proximal to the medial epicondyle.  This is tender to palpation.  There is no surrounding erythema  nor fluctuance noted.  There is no bony tenderness of the medial epicondyle or around the elbow.  He has full range of motion about the elbow.  Biceps tendon appears intact with a negative hook test.  Full strength about the bicep.  Neurovascular intact distally.  Imaging: [place] RT UPPER EXTREM LTD SOFT TISSUE NON VASCULAR CLINICAL DATA:  Tender lump in the distal forearm for 3 days.  EXAM: ULTRASOUND right UPPER EXTREMITY LIMITED  TECHNIQUE: Ultrasound examination of the upper extremity soft tissues was performed in the area of clinical  concern.  COMPARISON:  None Available.  FINDINGS: The patient's palpable abnormality corresponds to a complex cystic and solid lesion in the subcutaneous fat measuring approximately 1.7 x 0.9 x 1.4 cm. Hypoechoic cystic area centrally with enhanced through transmission. Smaller adjacent hypoechoic area.  Findings most likely represent the partially liquified hematoma. Abscess is also possible. Recommend correlation with clinical findings and clinical surveillance. If this does not improve as it should clinically recommend MR imaging without and with contrast for further evaluation.  IMPRESSION: As above.  Electronically Signed   By: Rudie Meyer M.D.   On: 02/04/2022 09:51    Past Medical/Family/Surgical/Social History: Medications & Allergies reviewed per EMR, new medications updated. Patient Active Problem List   Diagnosis Date Noted   Encounter for health maintenance examination in adult 11/21/2020   History of COVID-19 11/21/2020   Screening for prostate cancer 11/21/2020   Screen for colon cancer 11/21/2020   Mild intermittent asthma 11/21/2020   Acute hypoxemic respiratory failure due to COVID-19 [facility]) 11/23/2019   Noncompliance 09/23/2019   Allergic rhinitis due to pollen 09/23/2019   Trigger ring finger of left hand 09/23/2019   Stenosis of carotid artery 09/23/2019   Vaccine refused by patient 04/14/2018   Encounter for screening for vascular disease 04/14/2018   Erectile dysfunction 10/23/2017   S/P CABG x 4 01/28/2017   Family history of premature CAD 01/23/2017   NSTEMI (non-ST elevated myocardial infarction) (HCC) 01/23/2017   Mixed dyslipidemia 06/26/2016   Uncontrolled type 2 diabetes mellitus with complication, without long-term current use of insulin 09/03/2014   Obesity, Class II, BMI 35-39.9 09/03/2014   Past Medical History:  Diagnosis Date   CAD (coronary artery disease)    a. NSTEMI 01/2017 s/p CABG.   Carotid arterial disease (HCC)    a.  mild 1-39% 01/2017.   Dyslipidemia    Hx of CABG    Hyperlipidemia    Migraine    "only in [employer]" (01/23/2017)   Morbid obesity (HCC)    NSTEMI (non-ST elevated myocardial infarction) (HCC) 01/23/2017   type 2 diabetes 07/2016   Family History  Problem Relation Age of Onset   Gallbladder disease Mother    Aneurysm Mother        brain   Stroke Mother    Heart disease Father 6       3 vessel ABG   Heart disease Brother 52       MI   Other Brother        died of hemorrhage in head after fall   Past Surgical History:  Procedure Laterality Date   ANTERIOR CERVICAL DECOMP/DISCECTOMY FUSION     "took a piece off my right hip hip & put it in there"   BACK SURGERY     CORONARY ARTERY BYPASS GRAFT N/A 01/28/2017   Procedure: CORONARY ARTERY BYPASS GRAFTING (CABG ) x 4 USING LEFT INTERNAL MAMMARY ARTERY AND ENDOSCOPIC HARVESTING OF RIGHT  SAPHENOUS VEIN,  LIMA-LAD SVG-RAMUS SEQ SVG-PD-PL;  Surgeon: [name], MD;  Location: [facility] OR;  Service: Open Heart Surgery;  Laterality: N/A;   LEFT HEART CATH AND CORONARY ANGIOGRAPHY N/A 01/24/2017   Procedure: LEFT HEART CATH AND CORONARY ANGIOGRAPHY;  Surgeon: Corky Crafts, MD;  Location: [facility] INVASIVE CV LAB;  Service: Cardiovascular;  Laterality: N/A;   TEE WITHOUT CARDIOVERSION N/A 01/28/2017   Procedure: TRANSESOPHAGEAL ECHOCARDIOGRAM (TEE);  Surgeon: [name], MD;  Location: [facility] OR;  Service: Open Heart Surgery;  Laterality: N/A;   Social History   Occupational History   Not on file  Tobacco Use   Smoking status: Former    Types: Cigarettes   Smokeless tobacco: Never   Tobacco comments:    01/23/2017 "quit smoking in the 1990s"  Vaping Use   Vaping Use: Never used  Substance and Sexual Activity   Alcohol use: Yes    Alcohol/week: 0.0 standard drinks of alcohol    Comment: 01/23/2017 "maybe 4 beers/month"   Drug use: No   Sexual activity: Not Currently

## 2022-02-25 ENCOUNTER — Other Ambulatory Visit: Payer: Self-pay | Admitting: Medical

## 2022-02-27 ENCOUNTER — Ambulatory Visit
Admission: RE | Admit: 2022-02-27 | Discharge: 2022-02-27 | Disposition: A | Discharge status: Home / Self Care | Payer: BC Managed Care – PPO | Source: Ambulatory Visit | Attending: Sports Medicine | Admitting: Sports Medicine

## 2022-02-27 DIAGNOSIS — M25521 Pain in right elbow: Secondary | ICD-10-CM | POA: Diagnosis not present

## 2022-02-27 DIAGNOSIS — M79601 Pain in right arm: Secondary | ICD-10-CM

## 2022-02-27 DIAGNOSIS — M7989 Other specified soft tissue disorders: Secondary | ICD-10-CM | POA: Diagnosis not present

## 2022-02-27 MED ORDER — GADOPICLENOL 0.5 MMOL/ML IV SOLN
10.0000 mL | Freq: Once | INTRAVENOUS | Status: AC | PRN
  Administered 2022-02-27: 10 mL via INTRAVENOUS

## 2022-03-05 NOTE — Progress Notes (Signed)
Reviewed MRI with patient. Likely an enlarged lymph node.  Did discuss with Tarry Kos and he mentioned that enlarged lesion has completely gone back to normal side and is no longer tender or red or swollen.  Return precautions provided, otherwise follow-up as needed.  -DB

## 2022-04-04 IMAGING — CR DG CHEST 2V
2 series · 2 of 2 positions shown · non-contrast
Comparison: Chest radiograph dated 03/04/2017.

CLINICAL DATA: 59-year-old male with shortness of breath

EXAM:
CHEST - 2 VIEW

[chest pa]
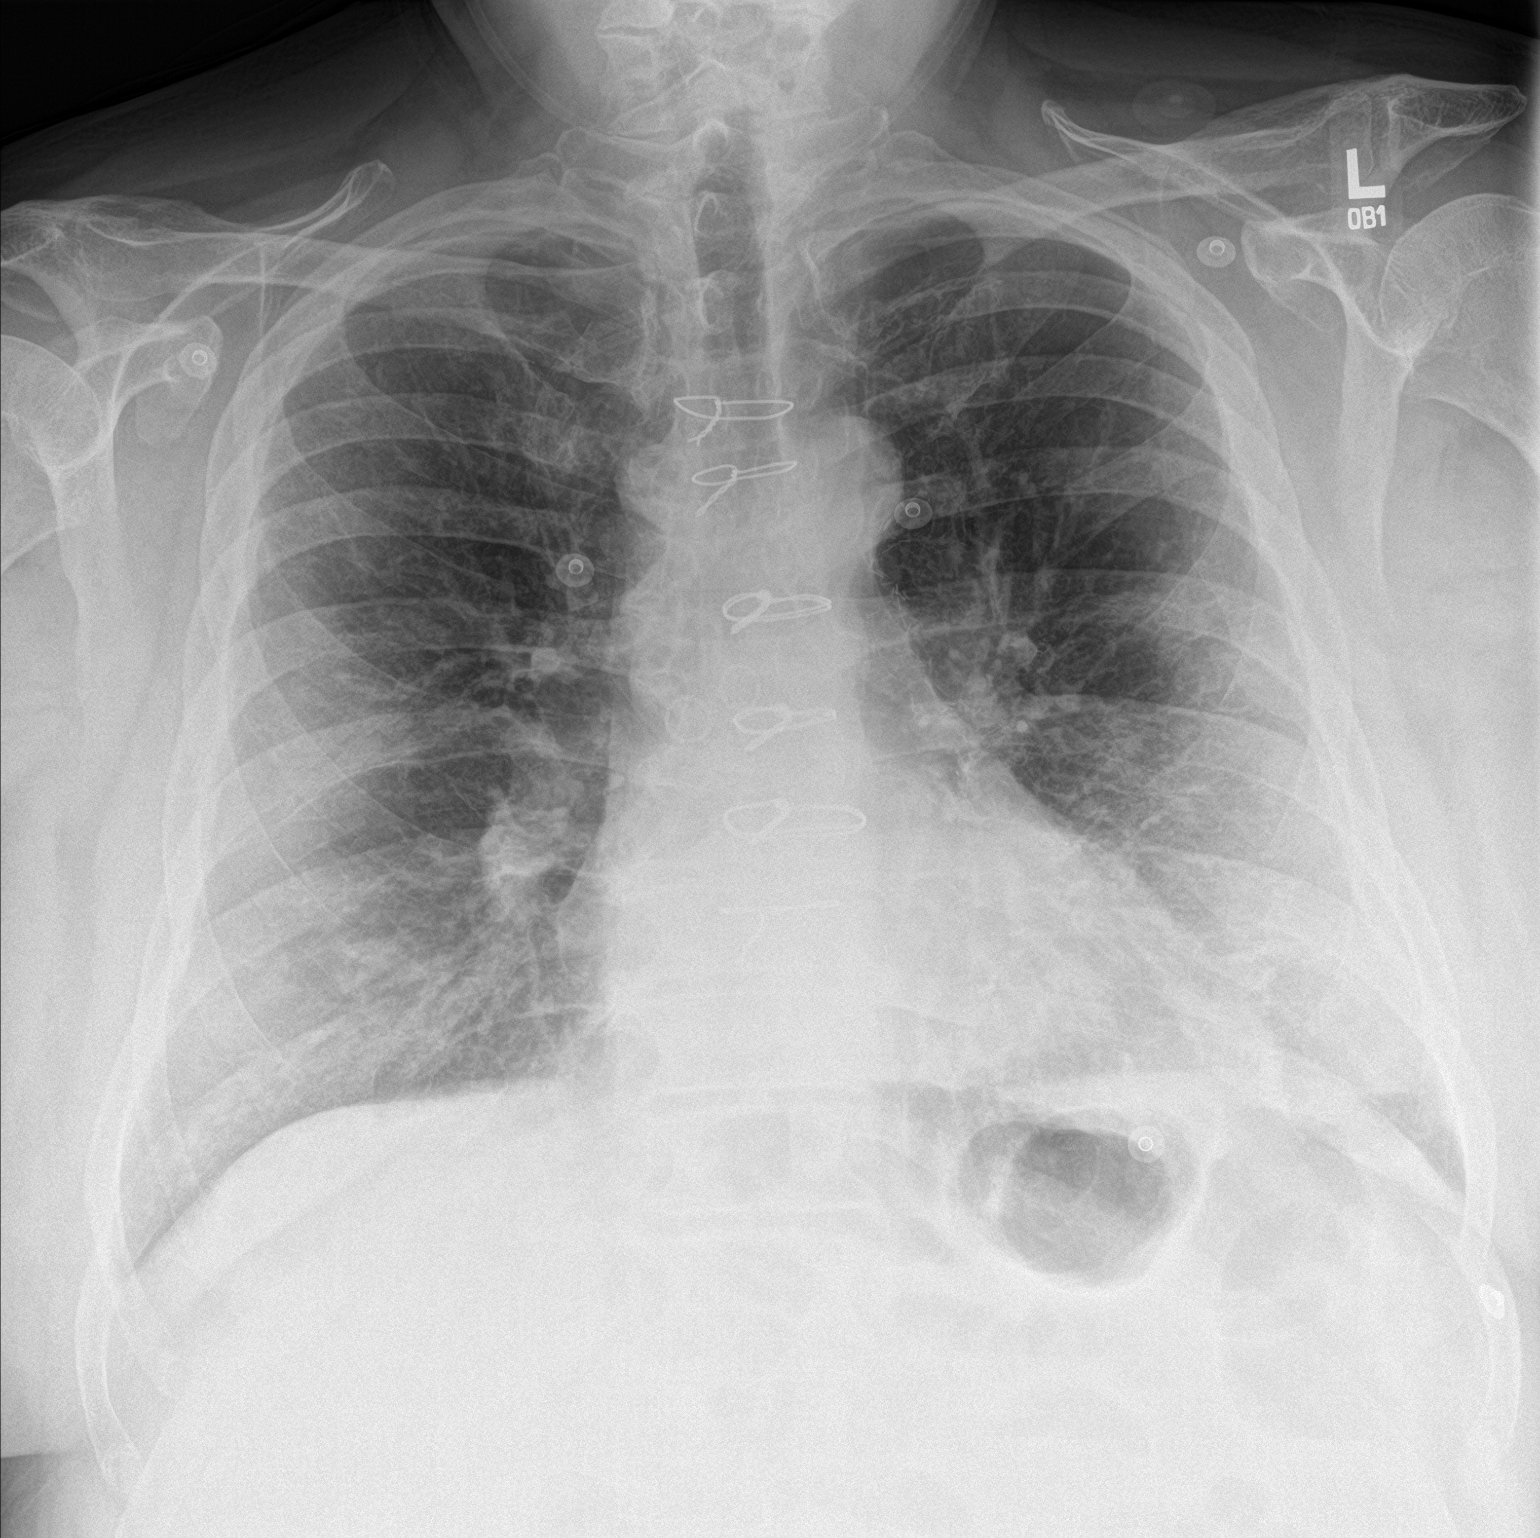

[chest lat]
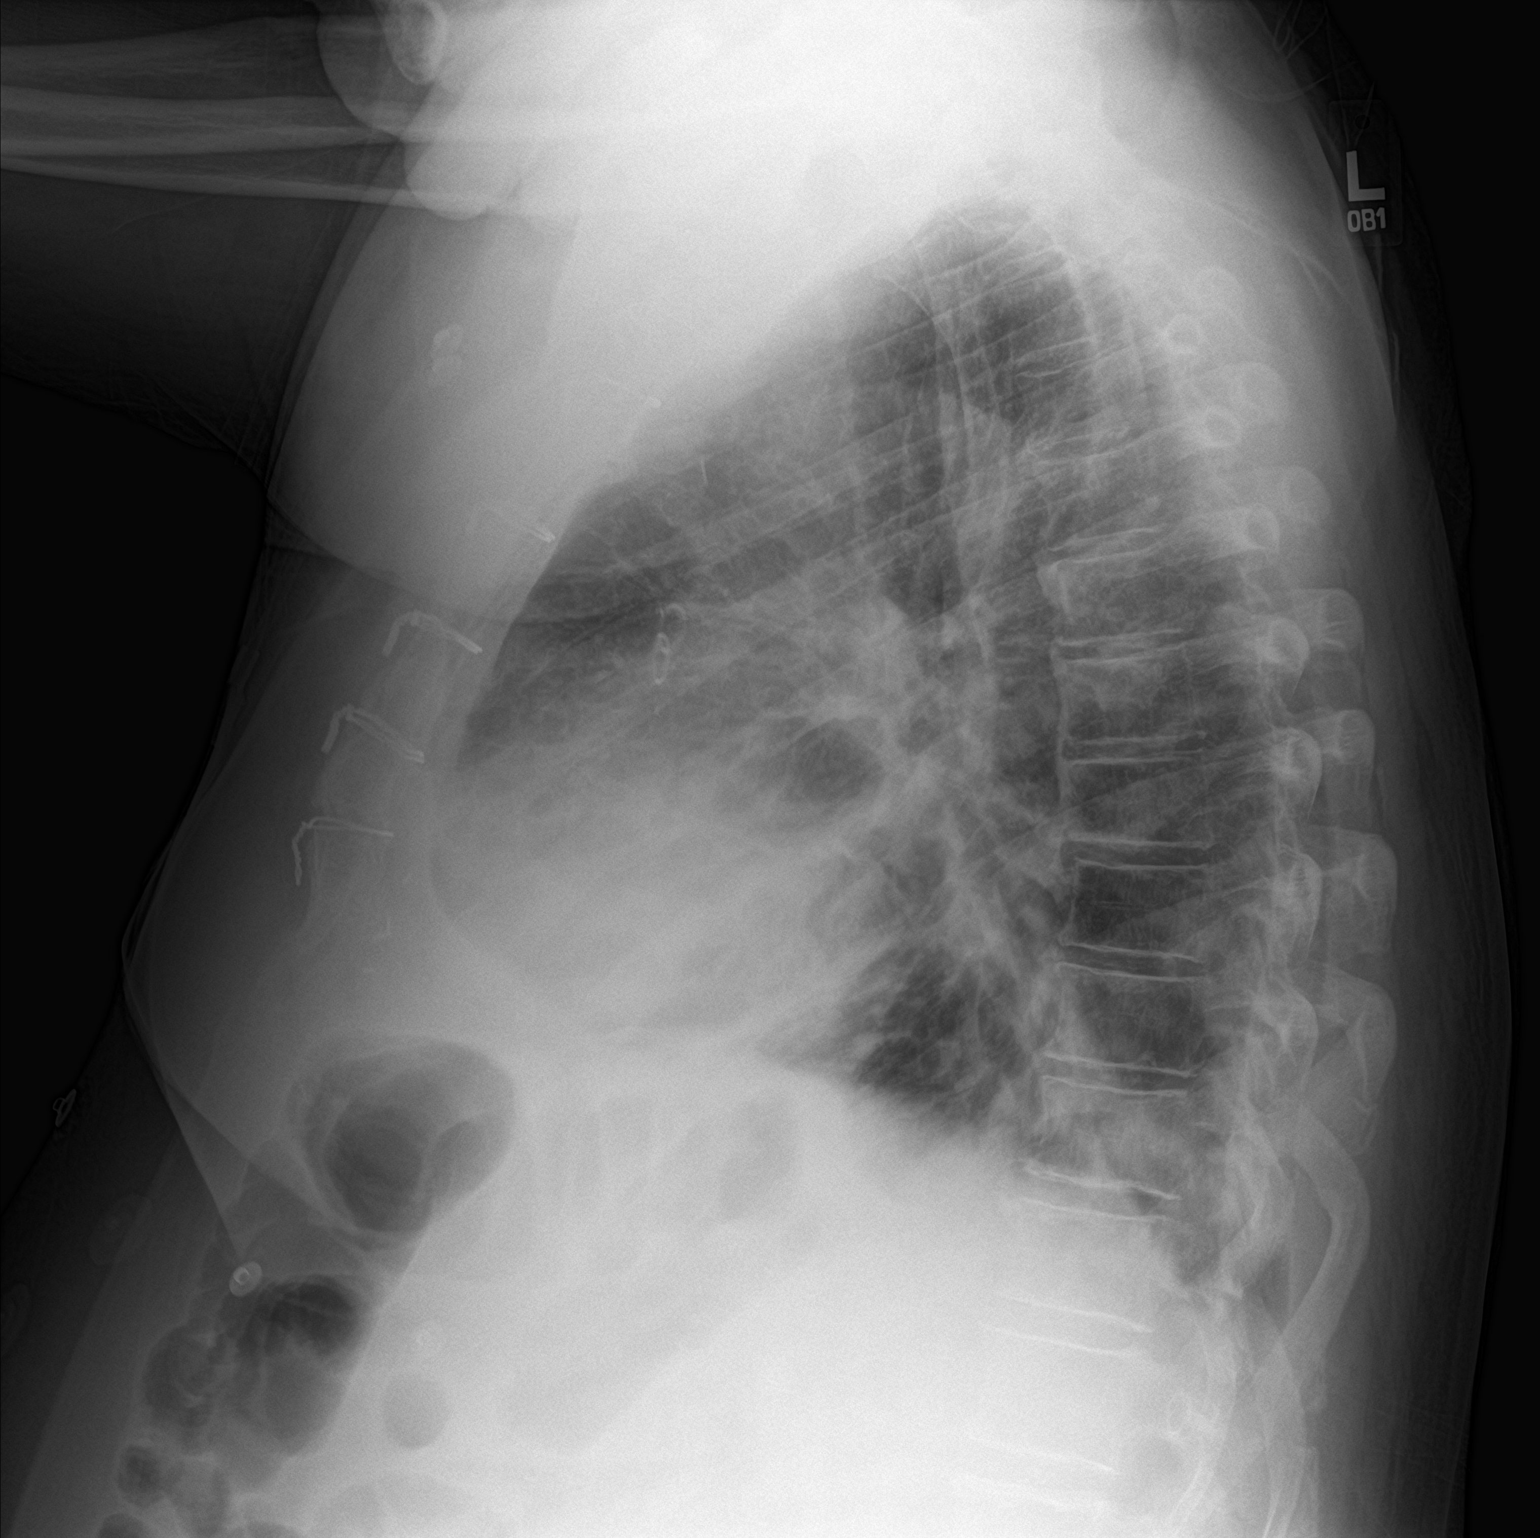

[2 of 2 positions shown; findings below may reference images not displayed]

FINDINGS: Bibasilar and bilateral peripheral and subpleural streaky densities
may represent atelectatic changes or atypical infection. Clinical
correlation is recommended. No lobar consolidation, pleural
effusion, or pneumothorax. The cardiac silhouette is within limits.
Median sternotomy wires and CABG vascular clips. No acute osseous
pathology.
IMPRESSION: Bibasilar and subpleural streaky densities may represent atelectatic
changes or atypical infection.

## 2022-07-02 DIAGNOSIS — M25562 Pain in left knee: Secondary | ICD-10-CM | POA: Diagnosis not present

## 2022-07-10 ENCOUNTER — Telehealth: Payer: Self-pay | Admitting: Medical

## 2022-07-10 MED ORDER — ATORVASTATIN CALCIUM 80 MG PO TABS: ORAL_TABLET | ORAL | 0 refills | Status: DC

## 2022-07-10 NOTE — Telephone Encounter (Signed)
Pharmacy sent refill request for lipitor Please send to the [facility], [place]

## 2022-07-10 NOTE — Telephone Encounter (Signed)
Pt is overdue for an appt and has an appt in march so I have refilled for 30 days

## 2022-07-25 ENCOUNTER — Ambulatory Visit: Payer: BC Managed Care – PPO | Admitting: Medical

## 2022-07-25 VITALS — BP 120/70 | HR 81 | Wt 284.8 lb

## 2022-07-25 DIAGNOSIS — Z0000 Encounter for general adult medical examination without abnormal findings: Secondary | ICD-10-CM | POA: Diagnosis not present

## 2022-07-25 DIAGNOSIS — Z951 Presence of aortocoronary bypass graft: Secondary | ICD-10-CM | POA: Diagnosis not present

## 2022-07-25 DIAGNOSIS — Z125 Encounter for screening for malignant neoplasm of prostate: Secondary | ICD-10-CM | POA: Diagnosis not present

## 2022-07-25 DIAGNOSIS — Z2820 Immunization not carried out because of patient decision for unspecified reason: Secondary | ICD-10-CM

## 2022-07-25 DIAGNOSIS — N529 Male erectile dysfunction, unspecified: Secondary | ICD-10-CM

## 2022-07-25 DIAGNOSIS — E1169 Type 2 diabetes mellitus with other specified complication: Secondary | ICD-10-CM

## 2022-07-25 DIAGNOSIS — J4520 Mild intermittent asthma, uncomplicated: Secondary | ICD-10-CM

## 2022-07-25 DIAGNOSIS — E782 Mixed hyperlipidemia: Secondary | ICD-10-CM

## 2022-07-25 DIAGNOSIS — Z8249 Family history of ischemic heart disease and other diseases of the circulatory system: Secondary | ICD-10-CM

## 2022-07-25 DIAGNOSIS — J301 Allergic rhinitis due to pollen: Secondary | ICD-10-CM

## 2022-07-25 LAB — POCT URINALYSIS DIP (PROADVANTAGE DEVICE)
Blood, UA: NEGATIVE
Glucose, UA: NEGATIVE mg/dL
Ketones, POC UA: NEGATIVE mg/dL
Leukocytes, UA: NEGATIVE
Nitrite, UA: NEGATIVE
Protein Ur, POC: NEGATIVE mg/dL
Specific Gravity, Urine: 1.03
Urobilinogen, Ur: 0.2
pH, UA: 6 (ref 5.0–8.0)

## 2022-07-25 MED ORDER — TIZANIDINE HCL 4 MG PO TABS: 4.0000 mg | ORAL_TABLET | Freq: Every evening | ORAL | 0 refills | Status: DC | PRN

## 2022-07-25 MED ORDER — IBUPROFEN 800 MG PO TABS: 800.0000 mg | ORAL_TABLET | Freq: Two times a day (BID) | ORAL | 0 refills | Status: DC | PRN

## 2022-07-25 NOTE — Progress Notes (Signed)
Subjective:   HPI  Joshua Howell is a 62 y.o. male who presents for Chief Complaint  Patient presents with   med check    Med check, no concerns    Patient Care Team: Michalla Ringer, Camelia Eng, PA-C as PCP - General (Family Medicine) Dorothy Spark, MD as PCP - Cardiology (Cardiology) Eye doctor Dr. Frankey Shown, orthopedics   Concerns: Lately having some left neck discomfort and stiffness in past week or so.   Can't fully turn left.   No arm pain, but if arm propped up on couch can sometimes go numb.  He has hx/o c-spine surgery 20+ years ago.  Has glucometer, checks sometimes, usually in normal range  Reviewed their medical, surgical, family, social, medication, and allergy history and updated chart as appropriate.  No Known Allergies  Past Medical History:  Diagnosis Date   CAD (coronary artery disease)    a. NSTEMI 01/2017 s/p CABG.   Carotid arterial disease (HCC)    a. mild 1-39% 01/2017.   Dyslipidemia    Hx of CABG    Hyperlipidemia    Migraine    "only in Junior High School" (01/23/2017)   Morbid obesity ([place])    NSTEMI (non-ST elevated myocardial infarction) ([place]) 01/23/2017   type 2 diabetes 07/2016    Current Outpatient Medications on File Prior to Visit  Medication Sig Dispense Refill   albuterol (VENTOLIN HFA) 108 (90 Base) MCG/ACT inhaler USE 2 INHALATIONS BY MOUTH EVERY 6 HOURS AS NEEDED FOR WHEEZING  OR SHORTNESS OF BREATH 34 g 3   ASPIRIN LOW DOSE 81 MG tablet TAKE 1 TABLET BY MOUTH  DAILY 90 tablet 3   atorvastatin (LIPITOR) 80 MG tablet TAKE 1 TABLET BY MOUTH  DAILY AT 6 PM. 30 tablet 0   metFORMIN (GLUCOPHAGE XR) 750 MG 24 hr tablet Take 1 tablet (750 mg total) by mouth in the morning and at bedtime. 180 tablet 1   No current facility-administered medications on file prior to visit.      Current Outpatient Medications:    albuterol (VENTOLIN HFA) 108 (90 Base) MCG/ACT inhaler, USE 2 INHALATIONS BY MOUTH EVERY 6 HOURS AS NEEDED FOR WHEEZING  OR  SHORTNESS OF BREATH, Disp: 34 g, Rfl: 3   ASPIRIN LOW DOSE 81 MG tablet, TAKE 1 TABLET BY MOUTH  DAILY, Disp: 90 tablet, Rfl: 3   atorvastatin (LIPITOR) 80 MG tablet, TAKE 1 TABLET BY MOUTH  DAILY AT 6 PM., Disp: 30 tablet, Rfl: 0   ibuprofen (ADVIL) 800 MG tablet, Take 1 tablet (800 mg total) by mouth 2 (two) times daily as needed., Disp: 30 tablet, Rfl: 0   metFORMIN (GLUCOPHAGE XR) 750 MG 24 hr tablet, Take 1 tablet (750 mg total) by mouth in the morning and at bedtime., Disp: 180 tablet, Rfl: 1   tiZANidine (ZANAFLEX) 4 MG tablet, Take 1 tablet (4 mg total) by mouth at bedtime as needed for muscle spasms., Disp: 20 tablet, Rfl: 0  Family History  Problem Relation Age of Onset   Gallbladder disease Mother    Aneurysm Mother        brain   Stroke Mother    Heart disease Father 12       3 vessel ABG   Heart disease Brother 30       MI   Other Brother        died of hemorrhage in head after fall    Past Surgical History:  Procedure Laterality Date   ANTERIOR  CERVICAL DECOMP/DISCECTOMY FUSION     "took a piece off my right hip hip & put it in there"   [place] GRAFT N/A 01/28/2017   Procedure: CORONARY ARTERY BYPASS GRAFTING (CABG ) x 4 USING LEFT INTERNAL MAMMARY ARTERY AND ENDOSCOPIC HARVESTING OF RIGHT SAPHENOUS VEIN,  LIMA-LAD SVG-RAMUS SEQ SVG-PD-PL;  Surgeon: Melrose Nakayama, MD;  Location: [place] [facility];  Service: Open Heart Surgery;  Laterality: N/A;   LEFT HEART CATH AND CORONARY ANGIOGRAPHY N/A 01/24/2017   Procedure: LEFT HEART CATH AND CORONARY ANGIOGRAPHY;  Surgeon: Jettie Booze, MD;  Location: [place] [facility];  Service: Cardiovascular;  Laterality: N/A;   TEE WITHOUT CARDIOVERSION N/A 01/28/2017   Procedure: TRANSESOPHAGEAL ECHOCARDIOGRAM (TEE);  Surgeon: Melrose Nakayama, MD;  Location: [place];  Service: Open Heart Surgery;  Laterality: N/A;    Review of Systems  Constitutional:  Negative for chills, fever, malaise/fatigue and  weight loss.  HENT:  Negative for congestion, ear pain, hearing loss, sore throat and tinnitus.   Eyes:  Negative for blurred vision, pain and redness.  Respiratory:  Negative for cough, hemoptysis and shortness of breath.   Cardiovascular:  Negative for chest pain, palpitations, orthopnea, claudication and leg swelling.  Gastrointestinal:  Negative for abdominal pain, blood in stool, constipation, diarrhea, nausea and vomiting.  Genitourinary:  Negative for dysuria, flank pain, frequency, hematuria and urgency.  Musculoskeletal:  Positive for joint pain and neck pain. Negative for falls and myalgias.  Skin:  Negative for itching and rash.  Neurological:  Negative for dizziness, tingling, speech change, weakness and headaches.  Endo/Heme/Allergies:  Negative for polydipsia. Does not bruise/bleed easily.  Psychiatric/Behavioral:  Negative for depression and memory loss. The patient is not nervous/anxious and does not have insomnia.      Objective:  BP 120/70   Pulse 81   Wt 284 lb 12.8 oz (129.2 kg)   BMI 38.63 kg/m   General appearance: alert, no distress, WD/WN, Caucasian male Skin: unremarkable HEENT: normocephalic, conjunctiva/corneas normal, sclerae anicteric, PERRLA, EOMi, nares patent, no discharge or erythema, pharynx normal Neck: slight reduced left lateral neck ROM, otherwise supple, no lymphadenopathy, no thyromegaly, no masses, normal ROM, no bruits Chest: non tender, normal shape and expansion Heart: RRR, normal S1, S2, no murmurs Lungs: CTA bilaterally, no wheezes, rhonchi, or rales Abdomen: +bs, soft, non tender, non distended, no masses, no hepatomegaly, no splenomegaly, no bruits Back: non tender, normal ROM, no scoliosis Musculoskeletal: upper extremities non tender, no obvious deformity, normal ROM throughout, lower extremities non tender, no obvious deformity, normal ROM throughout Extremities: no edema, no cyanosis, no clubbing Pulses: 2+ symmetric, upper and  lower extremities, normal cap refill Neurological: alert, oriented x 3, CN2-12 intact, strength normal upper extremities and lower extremities, sensation normal throughout, DTRs 2+ throughout, no cerebellar signs, gait normal Psychiatric: normal affect, behavior normal, pleasant  GU: declined Rectal: deferred/declined  Diabetic Foot Exam - Simple   Simple Foot Form Diabetic Foot exam was performed with the following findings: Yes 07/25/2022  3:05 PM  Visual Inspection See comments: Yes Sensation Testing Intact to touch and monofilament testing bilaterally: Yes Pulse Check Posterior Tibialis and Dorsalis pulse intact bilaterally: Yes Comments Flat feet      Assessment and Plan :   Encounter Diagnoses  Name Primary?   Encounter for health maintenance examination in adult Yes   Type 2 diabetes mellitus with other specified complication, unspecified whether long term insulin use ([place])  S/P CABG x 4    Mixed dyslipidemia    Vaccine refused by patient    Family history of premature CAD    Erectile dysfunction, unspecified erectile dysfunction type    Allergic rhinitis due to pollen, unspecified seasonality    Mild intermittent asthma, unspecified whether complicated    Screening for prostate cancer     This visit was a preventative care visit, also known as wellness visit or routine physical.   Topics typically include healthy lifestyle, diet, exercise, preventative care, vaccinations, sick and well care, proper use of emergency dept and after hours care, as well as other concerns.     Separate significant issues discussed: Diabetes-updated labs today, continue current medication, continue glucose monitoring  Hyperlipidemia-continue aspirin and statin.  Updated labs today fasting  History of CABG and heart disease-referral back to cardiology as he has not been back in a while.  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  This gives [place] a chance to discuss healthy lifestyle, exercise, vaccinations, review your chart record, and perform screenings where appropriate.  I recommend you see your eye doctor yearly for routine vision care.  I recommend you see your dentist yearly for routine dental care including hygiene visits twice yearly.   Vaccination   There is no immunization history on file for this patient.  Declines all vaccines  Screening for cancer: Colon cancer screening: Cologuard negative 08/2021   Prostate Cancer screening: The recommended prostate cancer screening test is a blood test called the prostate-specific antigen (PSA) test. PSA is a protein that is made in the prostate. As you age, your prostate naturally produces more PSA. Abnormally high PSA levels may be caused by: Prostate cancer. An enlarged prostate that is not caused by cancer (benign prostatic hyperplasia, or BPH). This condition is very common in older men. A prostate gland infection (prostatitis) or urinary tract infection. Certain medicines such as male hormones (like testosterone) or other medicines that raise testosterone levels. A rectal exam may be done as part of prostate cancer screening to help provide information about the size of your prostate gland. When a rectal exam is performed, it should be done after the PSA level is drawn to avoid any effect on the results.   Skin cancer screening: Check your skin regularly for new changes, growing lesions, or other lesions of concern Come in for evaluation if you have skin lesions of concern.   Lung cancer screening: If you have a greater than 20 pack year history of tobacco use, then you may qualify for lung cancer screening with a chest CT scan.   Please call your insurance company to inquire about coverage for this test.   Pancreatic cancer:  no  current screening test is available or routinely recommended. (risk factors: smoking, overweight or obese, diabetes, chronic pancreatitis, work exposure - dry cleaning, metal working, 64yo>, M>F, Sales promotion account executive, family hx/o, hereditary breast, ovarian, melanoma, lynch, peutz-jeghers).  Symptoms: jaundice, dark urine, light color or greasy stools, itchy skin, belly or back pain, weight loss, poor appetite, nause, vomiting, liver enlargement, DVT/blood clots.   We currently don't have screenings for other cancers besides breast, cervical, colon, and lung cancers.  If you have a strong family history of cancer or have other cancer screening concerns, please let me know.  Genetic testing referral is an option for  individuals with high cancer risk in the family.  There are some other cancer screenings in development currently.   Bone health: Get at least 150 minutes of aerobic exercise weekly Get weight bearing exercise at least once weekly Bone density test:  A bone density test is an imaging test that uses a type of X-ray to measure the amount of calcium and other minerals in your bones. The test may be used to diagnose or screen you for a condition that causes weak or thin bones (osteoporosis), predict your risk for a broken bone (fracture), or determine how well your osteoporosis treatment is working. The bone density test is recommended for females 59 and older, or females or males [id number] if certain risk factors such as thyroid disease, long term use of steroids such as for asthma or rheumatological issues, vitamin D deficiency, estrogen deficiency, family history of osteoporosis, self or family history of fragility fracture in first degree relative.    Heart health: Get at least 150 minutes of aerobic exercise weekly Limit alcohol It is important to maintain a healthy blood pressure and healthy cholesterol numbers  Heart disease screening: Screening for heart disease includes screening for  blood pressure, fasting lipids, glucose/diabetes screening, BMI height to weight ratio, reviewed of smoking status, physical activity, and diet.    Goals include blood pressure 120/80 or less, maintaining a healthy lipid/cholesterol profile, preventing diabetes or keeping diabetes numbers under good control, not smoking or using tobacco products, exercising most days per week or at least 150 minutes per week of exercise, and eating healthy variety of fruits and vegetables, healthy oils, and avoiding unhealthy food choices like fried food, fast food, high sugar and high cholesterol foods.    Referral back to cardiology   Vascular disease screening: For higher risk individuals including smokers, diabetics, patients with known heart disease or high blood pressure, kidney disease, and others, screening for vascular disease or atherosclerosis of the arteries is available.  Examples may include carotid ultrasound, abdominal aortic ultrasound, ABI blood flow screening in the legs, thoracic aorta screening.   Medical care options: I recommend you continue to seek care here first for routine care.  We try really hard to have available appointments Monday through Friday daytime hours for sick visits, acute visits, and physicals.  Urgent care should be used for after hours and weekends for significant issues that cannot wait till the next day.  The emergency department should be used for significant potentially life-threatening emergencies.  The emergency department is expensive, can often have long wait times for less significant concerns, so try to utilize primary care, urgent care, or telemedicine when possible to avoid unnecessary trips to the emergency department.  Virtual visits and telemedicine have been introduced since the pandemic started in 2020, and can be convenient ways to receive medical care.  We offer virtual appointments as well to assist you in a variety of options to seek medical  care.   Legal  Take the time to do a last will and testament, Advanced Directives including [place] and Living Will documents.  Don't leave your family with burdens that can be handled ahead of time.   Advanced Directives: I recommend you consider completing a [place] and Living Will.   These documents respect your wishes and help alleviate burdens on your loved ones if you were to become terminally ill or be in a position to need those documents enforced.    You can complete  Advanced Directives yourself, have them notarized, then have copies made for our office, for you and for anybody you feel should have them in safe keeping.  Or, you can have an attorney prepare these documents.   If you haven't updated your Last Will and Testament in a while, it may be worthwhile having an attorney prepare these documents together and save on some costs.       Spiritual and Emotional Health Keeping a healthy spiritual life can help you better manage your physical health. Your spiritual life can help you to cope with any issues that may arise with your physical health.  Balance can keep [place] healthy and help [place] to recover.  If you are struggling with your spiritual health there are questions that you may want to ask yourself:  What makes me feel most complete? When do I feel most connected to the rest of the world? Where do I find the most inner strength? What am I doing when I feel whole?  Helpful tips: Being in nature. Some people feel very connected and at peace when they are walking outdoors or are outside. Helping others. Some feel the largest sense of wellbeing when they are of service to others. Being of service can take on many forms. It can be doing volunteer work, being kind to strangers, or offering a hand to a friend in need. Gratitude. Some people find they feel the most connected when they remain grateful. They may make lists of all the things  they are grateful for or say a thank you out loud for all they have.    Emotional Health Are you in tune with your emotional health?  Check out this link: http://www.bray.com/    Financial Health Make sure you use a budget for your personal finances Make sure you are insured against risks (health insurance, life insurance, auto insurance, etc) Save more, spend less Set financial goals If you need help in this area, good resources include counseling through [employer] or other community resources, have a meeting with a Emergency planning/management officer, and a good resource is the [employer] was seen today for med check.  Diagnoses and all orders for this visit:  Encounter for health maintenance examination in adult -     Comprehensive metabolic panel -     CBC -     Lipid panel -     Hemoglobin A1c -     PSA -     Microalbumin/Creatinine Ratio, Urine -     POCT Urinalysis DIP (Proadvantage Device) -     Ambulatory referral to Cardiology  Type 2 diabetes mellitus with other specified complication, unspecified whether long term insulin use (HCC) -     Hemoglobin A1c -     Microalbumin/Creatinine Ratio, Urine  S/P CABG x 4 -     Ambulatory referral to Cardiology  Mixed dyslipidemia -     Lipid panel  Vaccine refused by patient  Family history of premature CAD  Erectile dysfunction, unspecified erectile dysfunction type  Allergic rhinitis due to pollen, unspecified seasonality  Mild intermittent asthma, unspecified whether complicated  Screening for prostate cancer -     PSA  Other orders -     tiZANidine (ZANAFLEX) 4 MG tablet; Take 1 tablet (4 mg total) by mouth at bedtime as needed for muscle spasms. -     ibuprofen (ADVIL) 800 MG tablet; Take 1 tablet (800 mg total) by mouth 2 (two) times daily  as needed.    Follow-up pending labs, yearly for physical

## 2022-07-26 ENCOUNTER — Other Ambulatory Visit: Payer: Self-pay | Admitting: Medical

## 2022-07-26 MED ORDER — ASPIRIN 81 MG PO TBEC: 81.0000 mg | DELAYED_RELEASE_TABLET | Freq: Every day | ORAL | 3 refills | Status: AC

## 2022-07-26 MED ORDER — METFORMIN HCL ER 750 MG PO TB24: 750.0000 mg | ORAL_TABLET | Freq: Two times a day (BID) | ORAL | 1 refills | Status: DC

## 2022-07-26 MED ORDER — ATORVASTATIN CALCIUM 80 MG PO TABS: ORAL_TABLET | ORAL | 3 refills | Status: DC

## 2022-07-26 MED ORDER — ALBUTEROL SULFATE HFA 108 (90 BASE) MCG/ACT IN AERS: 2.0000 | INHALATION_SPRAY | Freq: Four times a day (QID) | RESPIRATORY_TRACT | 0 refills | Status: DC | PRN

## 2022-07-26 NOTE — Progress Notes (Signed)
Still pending microalbumin kidney marker.  Platelets are slightly low but stable.  Diabetes marker is not at goal at 8%.  The rest of the labs are okay  Continue current medicines.  Regarding diabetes you are currently taking metformin.  I recommend we add something either a oral medicine like Rybelsus or Jardiance or a weekly injection like Ozempic to help get the blood sugars under better control.  I would recommend doing a weekly injection.  This is not insulin but a different kind of medicine that helps to lose weight and help control your sugars.  It is a small needle with a pen device is easy to use.  See which he is agreeable to.  I sent the other medicines to mail order

## 2022-07-27 ENCOUNTER — Other Ambulatory Visit: Payer: Self-pay | Admitting: Medical

## 2022-07-27 LAB — CBC
Hematocrit: 46 % (ref 37.5–51.0)
Hemoglobin: 15.5 g/dL (ref 13.0–17.7)
MCH: 31.7 pg (ref 26.6–33.0)
MCHC: 33.7 g/dL (ref 31.5–35.7)
MCV: 94 fL (ref 79–97)
Platelets: 146 10*3/uL — ABNORMAL LOW (ref 150–450)
RBC: 4.89 x10E6/uL (ref 4.14–5.80)
RDW: 13 % (ref 11.6–15.4)
WBC: 10 10*3/uL (ref 3.4–10.8)

## 2022-07-27 LAB — COMPREHENSIVE METABOLIC PANEL
ALT: 27 IU/L (ref 0–44)
AST: 28 IU/L (ref 0–40)
Albumin/Globulin Ratio: 1.4 (ref 1.2–2.2)
Albumin: 4.4 g/dL (ref 3.9–4.9)
Alkaline Phosphatase: 83 IU/L (ref 44–121)
BUN/Creatinine Ratio: 16 (ref 10–24)
BUN: 19 mg/dL (ref 8–27)
Bilirubin Total: 1.3 mg/dL — ABNORMAL HIGH (ref 0.0–1.2)
CO2: 17 mmol/L — ABNORMAL LOW (ref 20–29)
Calcium: 9.4 mg/dL (ref 8.6–10.2)
Chloride: 105 mmol/L (ref 96–106)
Creatinine, Ser: 1.19 mg/dL (ref 0.76–1.27)
Globulin, Total: 3.2 g/dL (ref 1.5–4.5)
Glucose: 113 mg/dL — ABNORMAL HIGH (ref 70–99)
Potassium: 4.5 mmol/L (ref 3.5–5.2)
Sodium: 141 mmol/L (ref 134–144)
Total Protein: 7.6 g/dL (ref 6.0–8.5)
eGFR: 69 mL/min/{1.73_m2} (ref >59)

## 2022-07-27 LAB — LIPID PANEL
Chol/HDL Ratio: 2.2 ratio (ref 0.0–5.0)
Cholesterol, Total: 98 mg/dL — ABNORMAL LOW (ref 100–199)
HDL: 45 mg/dL (ref >39)
LDL Chol Calc (NIH): 31 mg/dL (ref 0–99)
Triglycerides: 124 mg/dL (ref 0–149)
VLDL Cholesterol Cal: 22 mg/dL (ref 5–40)

## 2022-07-27 LAB — MICROALBUMIN / CREATININE URINE RATIO
Creatinine, Urine: 162.5 mg/dL
Microalb/Creat Ratio: 5 mg/g creat (ref 0–29)
Microalbumin, Urine: 7.6 ug/mL

## 2022-07-27 LAB — HEMOGLOBIN A1C
Est. average glucose Bld gHb Est-mCnc: 194 mg/dL
Hgb A1c MFr Bld: 8.4 % — ABNORMAL HIGH (ref 4.8–5.6)

## 2022-07-27 LAB — PSA: Prostate Specific Ag, Serum: 1.1 ng/mL (ref 0.0–4.0)

## 2022-07-27 MED ORDER — EMPAGLIFLOZIN 10 MG PO TABS: 10.0000 mg | ORAL_TABLET | Freq: Every day | ORAL | 2 refills | Status: DC

## 2022-08-15 ENCOUNTER — Encounter: Payer: Self-pay | Admitting: Cardiology

## 2022-08-15 ENCOUNTER — Ambulatory Visit: Payer: BC Managed Care – PPO | Attending: Physician Assistant | Admitting: Cardiology

## 2022-08-15 VITALS — BP 136/70 | HR 81 | Ht 72.0 in | Wt 282.6 lb

## 2022-08-15 DIAGNOSIS — I2510 Atherosclerotic heart disease of native coronary artery without angina pectoris: Secondary | ICD-10-CM | POA: Diagnosis not present

## 2022-08-15 DIAGNOSIS — I6529 Occlusion and stenosis of unspecified carotid artery: Secondary | ICD-10-CM

## 2022-08-15 DIAGNOSIS — Z024 Encounter for examination for driving license: Secondary | ICD-10-CM

## 2022-08-15 DIAGNOSIS — I214 Non-ST elevation (NSTEMI) myocardial infarction: Secondary | ICD-10-CM

## 2022-08-15 DIAGNOSIS — E782 Mixed hyperlipidemia: Secondary | ICD-10-CM | POA: Diagnosis not present

## 2022-08-15 DIAGNOSIS — I6523 Occlusion and stenosis of bilateral carotid arteries: Secondary | ICD-10-CM | POA: Diagnosis not present

## 2022-08-15 NOTE — Patient Instructions (Signed)
Medication Instructions:  Your physician recommends that you continue on your current medications as directed. Please refer to the Current Medication list given to you today.  *If you need a refill on your cardiac medications before your next appointment, please call your pharmacy*   Lab Work: None ordered  If you have labs (blood work) drawn today and your tests are completely normal, you will receive your results only by: [place] (if you have MyChart) OR A paper copy in the mail If you have any lab test that is abnormal or we need to change your treatment, we will call you to review the results.   Testing/Procedures: Your physician has requested that you have a lexiscan myoview. For further information please visit [email or web address]. Please follow instruction sheet, BELOW:    You are scheduled for a Myocardial Perfusion Imaging Study Please arrive 15 minutes prior to your appointment time for registration and insurance purposes.  The test will take approximately 3 to 4 hours to complete; you may bring reading material.  If someone comes with you to your appointment, they will need to remain in the main lobby due to limited space in the testing area. **If you are pregnant or breastfeeding, please notify the nuclear lab prior to your appointment**  How to prepare for your Myocardial Perfusion Test: Do not eat or drink 3 hours prior to your test, except you may have water. Do not consume products containing caffeine (regular or decaffeinated) 12 hours prior to your test. (ex: coffee, chocolate, sodas, tea). Do bring a list of your current medications with you.  If not listed below, you may take your medications as normal. Do wear comfortable clothes (no dresses or overalls) and walking shoes, tennis shoes preferred (No heels or open toe shoes are allowed). Do NOT wear cologne, perfume, aftershave, or lotions (deodorant is allowed). If these instructions are not followed, your  test will have to be rescheduled.   Your physician has requested that you have a carotid duplex. This test is an ultrasound of the carotid arteries in your neck. It looks at blood flow through these arteries that supply the brain with blood. Allow one hour for this exam. There are no restrictions or special instructions.   Follow-Up: At [facility], you and your health needs are our priority.  As part of our continuing mission to provide you with exceptional heart care, we have created designated Provider Care Teams.  These Care Teams include your primary Cardiologist (physician) and Advanced Practice Providers (APPs -  Physician Assistants and Nurse Practitioners) who all work together to provide you with the care you need, when you need it.  We recommend signing up for the patient portal called "MyChart".  Sign up information is provided on this After Visit Summary.  MyChart is used to connect with patients for Virtual Visits (Telemedicine).  Patients are able to view lab/test results, encounter notes, upcoming appointments, etc.  Non-urgent messages can be sent to your provider as well.   To learn more about what you can do with MyChart, go to [email or web address].    Your next appointment:   12 month(s)  Provider:   Gwyndolyn Kaufman, MD     Other Instructions

## 2022-08-15 NOTE — Progress Notes (Signed)
Cardiology Office Note:    Date:  08/15/2022   ID:  Joshua Howell, DOB March 08, 1961, MRN [id number]  PCP:  Joshua Howell   [place] [facility] Cardiologist:  Joshua Bergeron, MD     Referring MD: Joshua Hurl, PA-C   CC: follow up for CAD  History of Present Illness:    Joshua Howell is a 62 y.o. male with a hx of CAD (NSTEMI 2018 s/p CABG x 4), carotid artery stenosis, asthma, hyperlipidemia, ED, DM2, HFmrEF.    CABG in 2018 - LIMA to the LAD, SVG to the ramus intermediate, sequential SVG to PDA and PLVB   Established with [facility] in 2018 following his CABG. Most recently he was evaluated on 04/20/20 by Joshua Husk PA for DOT physical. Lexiscan Myoview was low risk without ischemia.   He presents today for follow up of his CAD and DOT physical. He has been doing well from a cardiac perspective. He is typically seen once every few years as needed for DOT evaluation. 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 He denies chest pain, palpitations, dyspnea, pnd, orthopnea, n, v, dizziness, syncope, edema, weight gain, or early satiety.    Past Medical History:  Diagnosis Date   CAD (coronary artery disease)    a. NSTEMI 01/2017 s/p CABG.   Carotid arterial disease    a. mild 1-39% 01/2017.   Dyslipidemia    Hx of CABG    Hyperlipidemia    Migraine    "only in [employer]" (01/23/2017)   Morbid obesity    NSTEMI (non-ST elevated myocardial infarction) 01/23/2017   type 2 diabetes 07/2016    Past Surgical History:  Procedure Laterality Date   ANTERIOR CERVICAL DECOMP/DISCECTOMY FUSION     "took a piece off my right hip hip & put it in there"   [place] GRAFT N/A 01/28/2017   Procedure: CORONARY ARTERY BYPASS GRAFTING (CABG ) x 4 USING LEFT INTERNAL MAMMARY ARTERY AND ENDOSCOPIC HARVESTING OF RIGHT SAPHENOUS VEIN,  LIMA-LAD SVG-RAMUS SEQ SVG-PD-PL;  Surgeon: Joshua Nakayama,  MD;  Location: [facility];  Service: Open Heart Surgery;  Laterality: N/A;   LEFT HEART CATH AND CORONARY ANGIOGRAPHY N/A 01/24/2017   Procedure: LEFT HEART CATH AND CORONARY ANGIOGRAPHY;  Surgeon: Joshua Booze, MD;  Location: [facility];  Service: Cardiovascular;  Laterality: N/A;   TEE WITHOUT CARDIOVERSION N/A 01/28/2017   Procedure: TRANSESOPHAGEAL ECHOCARDIOGRAM (TEE);  Surgeon: Joshua Nakayama, MD;  Location: [facility];  Service: Open Heart Surgery;  Laterality: N/A;    Current Medications: Current Meds  Medication Sig   albuterol (VENTOLIN HFA) 108 (90 Base) MCG/ACT inhaler Inhale 2 puffs into the lungs every 6 (six) hours as needed for wheezing or shortness of breath.   aspirin EC (ASPIRIN LOW DOSE) 81 MG tablet Take 1 tablet (81 mg total) by mouth daily.   atorvastatin (LIPITOR) 80 MG tablet TAKE 1 TABLET BY MOUTH  DAILY AT 6 PM.   empagliflozin (JARDIANCE) 10 MG TABS tablet Take 1 tablet (10 mg total) by mouth daily before breakfast.   ibuprofen (ADVIL) 800 MG tablet Take 1 tablet (800 mg total) by mouth 2 (two) times daily as needed.   metFORMIN (GLUCOPHAGE XR) 750 MG 24 hr tablet Take 1 tablet (750 mg total) by mouth in the morning and at bedtime.   tiZANidine (ZANAFLEX) 4 MG tablet Take 1  tablet (4 mg total) by mouth at bedtime as needed for muscle spasms.     Allergies:   Patient has no known allergies.   Social History   Socioeconomic History   Marital status: Widowed    Spouse name: Not on file   Number of children: Not on file   Years of education: Not on file   Highest education level: Not on file  Occupational History   Not on file  Tobacco Use   Smoking status: Former    Types: Cigarettes   Smokeless tobacco: Never   Tobacco comments:    01/23/2017 "quit smoking in the 1990s"  Vaping Use   Vaping Use: Never used  Substance and Sexual Activity   Alcohol use: Yes    Alcohol/week: 0.0 standard drinks of alcohol    Comment: 01/23/2017 "maybe 4  beers/month"   Drug use: No   Sexual activity: Not Currently  Other Topics Concern   Not on file  Social History Narrative   Lives alone.   [employer] cur[employer].  09/2019   Social Determinants of Health   Financial Resource Strain: Not on file  Food Insecurity: Not on file  Transportation Needs: Not on file  Physical Activity: Not on file  Stress: Not on file  Social Connections: Not on file     Family History: The patient's family history includes Aneurysm in his mother; Gallbladder disease in his mother; Heart disease (age of onset: 73) in his brother; Heart disease (age of onset: 14) in his father; Other in his brother; Stroke in his mother.  ROS:   Please see the history of present illness.    All other systems reviewed and are negative.  EKGs/Labs/Other Studies Reviewed:    The following studies were reviewed today:  Cardiac Studies & Procedures   CARDIAC CATHETERIZATION  CARDIAC CATHETERIZATION 01/24/2017  Narrative  Mid RCA lesion, 80 %stenosed. Proximal vessel is ectatic.  Post Atrio lesion, 100 %stenosed.  Ramus lesion, 95 %stenosed.  Ost LAD to Prox LAD lesion, 90 %stenosed.  The left ventricular systolic function is normal.  LV end diastolic pressure is normal.  The left ventricular ejection fraction is 55-65% by visual estimate.  There is no aortic valve stenosis.  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  Plan for CVTS consult.  Restart heparin 6 hours post sheath pull.  Findings Coronary Findings Diagnostic  Dominance: Right  Left Anterior Descending  Ramus Intermedius Culprit lesion. The lesion is thrombotic and ulcerative.  Right Coronary Artery The vessel is ectatic.  Right Posterior Atrioventricular Artery  Third Right Posterolateral Branch Collaterals 3rd RPL filled by collaterals from Dist LAD.  Intervention  No interventions have been documented.   STRESS TESTS  MYOCARDIAL PERFUSION IMAGING  04/29/2020  Narrative  Nuclear stress EF: 51%. The left ventricular ejection fraction is mildly decreased (45-54%).  There was no ST segment deviation noted during stress.  Defect 1: There is a small defect of severe severity present in the apical lateral and apex location.  Findings consistent with prior apical lateral myocardial infarction.  This is a low risk study. There is no evidence of ischemia   ECHOCARDIOGRAM  ECHOCARDIOGRAM COMPLETE 01/24/2017  Narrative *[facility]* [address], [place] [address] [phone]  ------------------------------------------------------------------- Transthoracic Echocardiography  Patient:    Joshua Howell, Joshua Howell MR #:       [id number] Study Date: 01/24/2017 Gender:     M Age:  56 Height:     182.9 cm Weight:     128.3 kg BSA:        2.6 m^2 Pt. Status: Room:       6C03C  SONOGRAPHER  Dustin Flock, RCS ADMITTING    Candee Furbish, M.D. ATTENDING    Virgel Manifold [id number] PERFORMING   [facility], Inpatient ORDERING     Fudim, Marat REFERRING    Fudim, Marat  cc:  ------------------------------------------------------------------- LV EF: 60% -   65%  ------------------------------------------------------------------- Indications:      MI - acute 410.91.  ------------------------------------------------------------------- History:   PMH:  DM, NSTEMI, Hep. B, hypertension, Morbid obesity.  ------------------------------------------------------------------- Study Conclusions  - Left ventricle: The cavity size was normal. Systolic function was normal. The estimated ejection fraction was in the range of 60% to 65%. Wall motion was normal; there were no regional wall motion abnormalities. - Right atrium: The atrium was mildly dilated.  ------------------------------------------------------------------- Study data:  No prior study was available for comparison.  Study status:   Routine.  Procedure:  The patient reported no pain pre or post test. Transthoracic echocardiography. Image quality was adequate. The study was technically difficult, as a result of poor sound wave transmission. Intravenous contrast (Definity) was administered.  Study completion:  There were no complications. Transthoracic echocardiography.  M-mode, complete 2D, spectral Doppler, and color Doppler.  Birthdate:  Patient birthdate: 1960-07-11.  Age:  Patient is 62 yr old.  Sex:  Gender: male. BMI: 38.4 kg/m^2.  Blood pressure:     107/75  Patient status: Inpatient.  Study date:  Study date: 01/24/2017. Study time: 11:23 AM.  Location:  Bedside.  -------------------------------------------------------------------  ------------------------------------------------------------------- Left ventricle:  The cavity size was normal. Systolic function was normal. The estimated ejection fraction was in the range of 60% to 65%. Wall motion was normal; there were no regional wall motion abnormalities.  ------------------------------------------------------------------- Aortic valve:   Structurally normal valve.   Cusp separation was normal.  Doppler:  Transvalvular velocity was within the normal range. There was no stenosis. There was no regurgitation.  ------------------------------------------------------------------- Aorta:  Aortic root: The aortic root was normal in size. Ascending aorta: The ascending aorta was normal in size.  ------------------------------------------------------------------- Mitral valve:   Structurally normal valve.   Leaflet separation was normal.  Doppler:  Transvalvular velocity was within the normal range. There was no evidence for stenosis. There was no regurgitation.  ------------------------------------------------------------------- Left atrium:  The atrium was normal in size.  ------------------------------------------------------------------- Right  ventricle:  The cavity size was normal. Systolic function was normal.  ------------------------------------------------------------------- Pulmonic valve:    The valve appears to be grossly normal. Doppler:  There was no significant regurgitation.  ------------------------------------------------------------------- Tricuspid valve:   The valve appears to be grossly normal. Doppler:  There was no significant regurgitation.  ------------------------------------------------------------------- Pulmonary artery:   Systolic pressure was within the normal range.  ------------------------------------------------------------------- Right atrium:  The atrium was mildly dilated.  ------------------------------------------------------------------- Pericardium:  There was no pericardial effusion.  ------------------------------------------------------------------- Measurements  Left ventricle                          Value        Reference LV ID, ED, PLAX chordal         (H)     54.9  mm     43 - 52 LV ID, ES, PLAX chordal         (H)     41.4  mm  23 - 38 LV fx shortening, PLAX chordal  (L)     25    %      >=29 LV PW thickness, ED                     9.75  mm     ---------- IVS/LV PW ratio, ED                     1.07         <=1.3 Stroke volume, 2D                       71    ml     ---------- Stroke volume/bsa, 2D                   27    ml/m^2 ---------- LV e&', lateral                          8.51  cm/s   ---------- LV E/e&', lateral                        6.69         ---------- LV e&', medial                           4.68  cm/s   ---------- LV E/e&', medial                         12.16        ---------- LV e&', average                          6.6   cm/s   ---------- LV E/e&', average                        8.63         ----------  Ventricular septum                      Value        Reference IVS thickness, ED                       10.4  mm     ----------  LVOT                                     Value        Reference LVOT ID, S                              21    mm     ---------- LVOT area                               3.46  cm^2   ---------- LVOT peak velocity, S                   92.5  cm/s   ---------- LVOT mean velocity, S  66.6  cm/s   ---------- LVOT VTI, S                             20.6  cm     ----------  Aorta                                   Value        Reference Aortic root ID, ED                      32    mm     ----------  Left atrium                             Value        Reference LA ID, A-P, ES                          38    mm     ---------- LA ID/bsa, A-P                          1.46  cm/m^2 <=2.2 LA volume, S                            56    ml     ---------- LA volume/bsa, S                        21.5  ml/m^2 ---------- LA volume, ES, 1-p A4C                  71.1  ml     ---------- LA volume/bsa, ES, 1-p A4C              27.3  ml/m^2 ---------- LA volume, ES, 1-p A2C                  38.1  ml     ---------- LA volume/bsa, ES, 1-p A2C              14.6  ml/m^2 ----------  Mitral valve                            Value        Reference Mitral E-wave peak velocity             56.9  cm/s   ---------- Mitral A-wave peak velocity             61.3  cm/s   ---------- Mitral deceleration time        (H)     254   ms     150 - 230 Mitral E/A ratio, peak                  0.9          ----------  Pulmonary arteries                      Value        Reference PA pressure, S, DP  15    mm Hg  <=30  Tricuspid valve                         Value        Reference Tricuspid regurg peak velocity          173   cm/s   ---------- Tricuspid peak RV-RA gradient           12    mm Hg  ----------  Right atrium                            Value        Reference RA ID, S-I, ES, A4C             (H)     58.9  mm     34 - 49 RA area, ES, A4C                (H)     21.8  cm^2   8.3 - 19.5 RA volume, ES, A/L                       66.3  ml     ---------- RA volume/bsa, ES, A/L                  25.5  ml/m^2 ----------  Systemic veins                          Value        Reference Estimated CVP                           3     mm Hg  ----------  Right ventricle                         Value        Reference RV ID, minor axis, ED, A4C base         25.8  mm     ---------- TAPSE                                   28.7  mm     ---------- RV pressure, S, DP                      15    mm Hg  <=30 RV s&', lateral, S                       3.24  cm/s   ----------  Legend: (L)  and  (H)  mark values outside specified reference range.  ------------------------------------------------------------------- Prepared and Electronically Authenticated by  Mertie Moores, M.D. 2018-09-13T15:00:45   TEE  ECHO TEE 01/28/2017  Narrative *[facility]* [address], [place] [address] [phone]  ------------------------------------------------------------------- Intraoperative Transesophageal Echocardiography  Patient:    Howell, Joshua MR #:       [id number] Study Date: 01/28/2017 Gender:     M Age:        46 Height:     182.9 cm Weight:     127.9 kg BSA:  2.47 m^2 Pt. Status: Room:       [place]    [place] Charon, MD ORDERING     [place] Charon, MD REFERRING    [place] Charon, MD PERFORMING   Annye Asa, MD ADMITTING    Candee Furbish, M.D. SONOGRAPHER  Roseanna Rainbow  cc:  ------------------------------------------------------------------- LV EF: 35% -   40%  ------------------------------------------------------------------- Indications:      CAD of native vessels 414.01.  ------------------------------------------------------------------- History:   PMH:   Dyspnea.  PMH:   Myocardial infarction.  Risk factors:  Hypertension. Diabetes mellitus. Morbidly  obese.  ------------------------------------------------------------------- Study Conclusions  - Left ventricle: Systolic function was moderately reduced. The estimated ejection fraction was in the range of 35% to 40%. Diffuse hypokinesis, with septal thinning. - Staged echo: Limited post-CPB exam: Improved LV function, EF 40-45%. no segmental wall motion abnormalities. No change post bypass in aortic valve function. No change post bypass in mitral valve function. Mild MR persists.  Impressions:  - LV function improvement from pre-bypass images. No other significant changes from pre-bypass images.  ------------------------------------------------------------------- Study data:   Study status:  Routine.  Consent:  The risks, benefits, and alternatives to the procedure were explained to the patient and informed consent was obtained.  Procedure:  The patient reported no pain pre or post test. Sedation. General anesthesia was administered by anesthesiology staff. Transesophageal echocardiography. An adult multiplane transesophageal probe was inserted by the anesthesiologistwithout difficulty. Image quality was adequate.  Study completion:  There were no complications. Intraoperative transesophageal echocardiography.  Birthdate: Patient birthdate: 29-Aug-1960.  Age:  Patient is 62 yr old.  Sex: Gender: male.    BMI: 38.2 kg/m^2.  Blood pressure:     120/61 Patient status:  Inpatient.  Study date:  Study date: 01/28/2017. Study time: 07:21 AM.  Location:  Operating room.  -------------------------------------------------------------------  ------------------------------------------------------------------- Left ventricle:  Systolic function was moderately reduced. The estimated ejection fraction was in the range of 35% to 40%. Diffuse hypokinesis, with septal thinning.  ------------------------------------------------------------------- Aortic valve:   Normal-sized, noncalcified  annulus. Structurally normal valve. Trileaflet; normal thickness leaflets. Cusp separation was normal.  Doppler:  Transvalvular velocity was within the normal range. There was no stenosis. There was no regurgitation.  ------------------------------------------------------------------- Aorta:  The aorta was mildly calcified.  ------------------------------------------------------------------- Mitral valve:   Normal-sized annulus. Normal thickness leaflets . Leaflet separation was normal. Mobility was not restricted. No echocardiographic evidence for prolapse.  Doppler:  Transvalvular velocity was within the normal range. There was no evidence for stenosis. There was mild regurgitation directed centrally.    Mean gradient (D): 0 mm Hg.  ------------------------------------------------------------------- Left atrium:   No evidence of thrombus in the atrial cavity or appendage.  ------------------------------------------------------------------- Atrial septum:  No defect or patent foramen ovale was identified by color doppler interrogation.  ------------------------------------------------------------------- Pulmonary veins:  Visualization of the pulmonary venous anatomy is incomplete, but a significant abnormality is unlikely.  ------------------------------------------------------------------- Right ventricle:  The cavity size was normal. Wall thickness was normal. Systolic function was normal.  ------------------------------------------------------------------- Pulmonic valve:    Structurally normal valve.   Cusp separation was normal.  No evidence of vegetation.  Doppler:  There was trivial regurgitation, around the PA catheter.  ------------------------------------------------------------------- Tricuspid valve:   Structurally normal valve.   Leaflet separation was normal.  No evidence of vegetation.  Doppler:  There was trivial  regurgitation.  ------------------------------------------------------------------- Pulmonary artery:   The main pulmonary artery was normal-sized.  ------------------------------------------------------------------- Right atrium:  The atrium was normal in size.  No evidence of thrombus.  ------------------------------------------------------------------- Pre bypass:  Post bypass:  ------------------------------------------------------------------- Post procedure conclusions Left ventricle:  Limited post-CPB exam: Improved LV function, EF 40-45%. no segmental wall motion abnormalities. Aortic valve:  - No change post bypass in aortic valve function.  Mitral valve:  - No change post bypass in mitral valve function. Mild MR persists.  Ascending Aorta:  - The aorta was mildly calcified.  ------------------------------------------------------------------- Measurements  Mitral valve                 Value Mitral mean velocity, D      29.1  cm/s Mitral mean gradient, D      0     mm Hg Mitral annulus VTI, D        26.2  cm  Legend: (L)  and  (H)  mark values outside specified reference range.  ------------------------------------------------------------------- Prepared and Electronically Authenticated by  Annye Asa, MD 2018-09-17T15:29:34            EKG:  EKG is  ordered today.  The ekg ordered today demonstrates SR with sinus arrhythmia, consistent with prior EKG.   Recent Labs: 07/25/2022: ALT 27; BUN 19; Creatinine, Ser 1.19; Hemoglobin 15.5; Platelets 146; Potassium 4.5; Sodium 141  Recent Lipid Panel    Component Value Date/Time   CHOL 98 (L) 07/25/2022 1509   TRIG 124 07/25/2022 1509   HDL 45 07/25/2022 1509   CHOLHDL 2.2 07/25/2022 1509   CHOLHDL 3.8 01/24/2017 0640   VLDL 26 01/24/2017 0640   LDLCALC 31 07/25/2022 1509     Risk Assessment/Calculations:                Physical Exam:    VS:  BP 136/70   Pulse 81   Ht 6' (1.829 m)   Wt  282 lb 9.6 oz (128.2 kg)   SpO2 95%   BMI 38.33 kg/m     Wt Readings from Last 3 Encounters:  08/15/22 282 lb 9.6 oz (128.2 kg)  07/25/22 284 lb 12.8 oz (129.2 kg)  02/06/22 290 lb (131.5 kg)     GEN:  Well nourished, well developed in no acute distress HEENT: Normal NECK: No JVD; No carotid bruits LYMPHATICS: No lymphadenopathy CARDIAC: RRR, no murmurs, rubs, gallops RESPIRATORY:  Clear to auscultation without rales, wheezing or rhonchi  ABDOMEN: Soft, non-tender, non-distended MUSCULOSKELETAL:  No edema; No deformity  SKIN: Warm and dry NEUROLOGIC:  Alert and oriented x 3 PSYCHIATRIC:  Normal affect   ASSESSMENT:    1. Stenosis of carotid artery, unspecified laterality   2. NSTEMI (non-ST elevated myocardial infarction)   3. Coronary artery disease involving native coronary artery of native heart without angina pectoris   4. Mixed hyperlipidemia    PLAN:    In order of problems listed above:  CAD - s/p NSTEMI 2018 s/p CABG x 4; most recent lexiscan in 2021 low risk stress test without ischemia. Stable with no anginal symptoms. 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 Continue ASA, atorvastatin.  Carotid artery stenosis - mild bilateral stenosis noted on imaging in 2018, will repeat carotid US to determine if there has been progression of disease. HLD - LDL well controlled at 31 on 07/25/22, continue atorvastatin, managed by PCP.   Disposition - [facility] Call for ischemic evaluation needed for work. Carotid US for known mild carotid artery disease. Return in 1 year to establish care with Dr. Johney Frame.  Shared Decision Making/Informed Consent The risks [chest pain, shortness of breath, cardiac arrhythmias, dizziness, blood pressure fluctuations, myocardial infarction, stroke/transient ischemic attack, and life-threatening complications (estimated to be 1 in 10,000)], benefits (risk stratification, diagnosing  coronary artery disease, treatment guidance) and alternatives of a stress test were discussed in detail with Mr. Mikan and he agrees to proceed.    Medication Adjustments/Labs and Tests Ordered: Current medicines are reviewed at length with the patient today.  Concerns regarding medicines are outlined above.  Orders Placed This Encounter  Procedures   MYOCARDIAL PERFUSION IMAGING   EKG 12-Lead   VAS US CAROTID   No orders of the defined types were placed in this encounter.   Patient Instructions  Medication Instructions:  Your physician recommends that you continue on your current medications as directed. Please refer to the Current Medication list given to you today.  *If you need a refill on your cardiac medications before your next appointment, please call your pharmacy*   Lab Work: None ordered  If you have labs (blood work) drawn today and your tests are completely normal, you will receive your results only by: [place] (if you have MyChart) OR A paper copy in the mail If you have any lab test that is abnormal or we need to change your treatment, we will call you to review the results.   Testing/Procedures: Your physician has requested that you have a lexiscan myoview. For further information please visit [email or web address]. Please follow instruction sheet, BELOW:    You are scheduled for a Myocardial Perfusion Imaging Study Please arrive 15 minutes prior to your appointment time for registration and insurance purposes.  The test will take approximately 3 to 4 hours to complete; you may bring reading material.  If someone comes with you to your appointment, they will need to remain in the main lobby due to limited space in the testing area. **If you are pregnant or breastfeeding, please notify the nuclear lab prior to your appointment**  How to prepare for your Myocardial Perfusion Test: Do not eat or drink 3 hours prior to your test, except you may have  water. Do not consume products containing caffeine (regular or decaffeinated) 12 hours prior to your test. (ex: coffee, chocolate, sodas, tea). Do bring a list of your current medications with you.  If not listed below, you may take your medications as normal. Do wear comfortable clothes (no dresses or overalls) and walking shoes, tennis shoes preferred (No heels or open toe shoes are allowed). Do NOT wear cologne, perfume, aftershave, or lotions (deodorant is allowed). If these instructions are not followed, your test will have to be rescheduled.   Your physician has requested that you have a carotid duplex. This test is an ultrasound of the carotid arteries in your neck. It looks at blood flow through these arteries that supply the brain with blood. Allow one hour for this exam. There are no restrictions or special instructions.   Follow-Up: At [facility], you and your health needs are our priority.  As part of our continuing mission to provide you with exceptional heart care, we have created designated Provider Care Teams.  These Care Teams include your primary Cardiologist (physician) and Advanced Practice Providers (APPs -  Physician Assistants and Nurse Practitioners) who all work together to provide you with the care you need, when you need it.  We recommend signing up for the patient portal called "MyChart".  Sign up information is provided on this After Visit Summary.  MyChart is used to connect with patients for Virtual Visits (Telemedicine).  Patients are able to view lab/test results, encounter notes, upcoming appointments, etc.  Non-urgent messages can be sent to your provider as well.   To learn more about what you can do with MyChart, go to [email or web address].    Your next appointment:   12 month(s)  Provider:   Gwyndolyn Kaufman, MD     Other Instructions     Signed, Trudi Ida, NP  08/15/2022 3:27 PM    [place]

## 2022-08-21 ENCOUNTER — Telehealth (HOSPITAL_COMMUNITY): Payer: Self-pay | Admitting: *Deleted

## 2022-08-21 NOTE — Telephone Encounter (Signed)
Patient given detailed instructions per Myocardial Perfusion Study Information Sheet for the test on 08/27/2022 at 1:30. Patient notified to arrive 15 minutes early and that it is imperative to arrive on time for appointment to keep from having the test rescheduled.  If you need to cancel or reschedule your appointment, please call the office within 24 hours of your appointment. . Patient verbalized understanding.Daneil Dolin

## 2022-08-23 NOTE — Addendum Note (Signed)
Addended by: Flossie Dibble on: 08/23/2022 12:37 PM   Modules accepted: Orders

## 2022-08-27 ENCOUNTER — Ambulatory Visit (HOSPITAL_COMMUNITY): Payer: BC Managed Care – PPO | Attending: Cardiology

## 2022-08-27 DIAGNOSIS — I214 Non-ST elevation (NSTEMI) myocardial infarction: Secondary | ICD-10-CM

## 2022-08-27 DIAGNOSIS — I6529 Occlusion and stenosis of unspecified carotid artery: Secondary | ICD-10-CM | POA: Insufficient documentation

## 2022-08-27 MED ORDER — REGADENOSON 0.4 MG/5ML IV SOLN
0.4000 mg | Freq: Once | INTRAVENOUS | Status: AC
  Administered 2022-08-27: 0.4 mg via INTRAVENOUS

## 2022-08-27 MED ORDER — TECHNETIUM TC 99M TETROFOSMIN IV KIT
31.4000 | PACK | Freq: Once | INTRAVENOUS | Status: AC | PRN
  Administered 2022-08-27: 31.4 via INTRAVENOUS

## 2022-08-28 ENCOUNTER — Ambulatory Visit (HOSPITAL_COMMUNITY): Payer: BC Managed Care – PPO

## 2022-08-28 LAB — MYOCARDIAL PERFUSION IMAGING
Estimated workload: 1
Exercise duration (min): 1 min
Exercise duration (sec): 0 s
LV dias vol: 115 mL (ref 62–150)
LV sys vol: 65 mL
MPHR: 156 {beats}/min
Nuc Stress EF: 44 %
Peak HR: 98 {beats}/min
Percent HR: 62 %
Rest HR: 72 {beats}/min
Rest Nuclear Isotope Dose: 32.8 mCi
SDS: 1
SRS: 3
SSS: 5
ST Depression (mm): 0 mm
Stress Nuclear Isotope Dose: 31.4 mCi
TID: 1.06

## 2022-08-28 MED ORDER — TECHNETIUM TC 99M TETROFOSMIN IV KIT
32.8000 | PACK | Freq: Once | INTRAVENOUS | Status: AC | PRN
  Administered 2022-08-28: 32.8 via INTRAVENOUS

## 2022-08-29 ENCOUNTER — Telehealth: Payer: Self-pay

## 2022-08-29 DIAGNOSIS — R9439 Abnormal result of other cardiovascular function study: Secondary | ICD-10-CM

## 2022-08-29 NOTE — Telephone Encounter (Signed)
-----   Message from Flossie Dibble, NP sent at 08/29/2022 10:04 AM EDT ----- Mr. Nauta, Your recent stress test was not normal as it previously had been. We will need to repeat your echocardiogram, and will need you to come back in soon to discuss if we need to do any further testing. We will see how quickly we can get the echo, and get you set up to see Dr. Shari Prows or one of the NP/PA's pretty soon.  Thank you, Willaim Sheng

## 2022-08-29 NOTE — Telephone Encounter (Signed)
Called patient with results. Patient has appointment with PA next week. Placed order for echo. Someone will call him to schedule from echo department. Patient verbalized understanding.

## 2022-09-03 ENCOUNTER — Ambulatory Visit: Payer: BC Managed Care – PPO | Attending: Physician Assistant | Admitting: Physician Assistant

## 2022-09-03 ENCOUNTER — Encounter: Payer: Self-pay | Admitting: Physician Assistant

## 2022-09-03 VITALS — BP 126/72 | HR 68 | Ht 72.0 in | Wt 287.6 lb

## 2022-09-03 DIAGNOSIS — I214 Non-ST elevation (NSTEMI) myocardial infarction: Secondary | ICD-10-CM | POA: Diagnosis not present

## 2022-09-03 DIAGNOSIS — I5020 Unspecified systolic (congestive) heart failure: Secondary | ICD-10-CM | POA: Diagnosis not present

## 2022-09-03 DIAGNOSIS — I2510 Atherosclerotic heart disease of native coronary artery without angina pectoris: Secondary | ICD-10-CM

## 2022-09-03 MED ORDER — NITROGLYCERIN 0.4 MG SL SUBL: 0.4000 mg | SUBLINGUAL_TABLET | SUBLINGUAL | 3 refills | Status: DC | PRN

## 2022-09-03 NOTE — Patient Instructions (Signed)
Medication Instructions:  1.Start sublingual nitroglycerin 0.4 mg, place one tablet under the tongue every 5 minutes as needed for chest pain, max 3 doses in 5 minutes. If no relief, call 911. *If you need a refill on your cardiac medications before your next appointment, please call your pharmacy*  Lab Work: None If you have labs (blood work) drawn today and your tests are completely normal, you will receive your results only by: MyChart Message (if you have MyChart) OR A paper copy in the mail If you have any lab test that is abnormal or we need to change your treatment, we will call you to review the results.  Follow-Up: At [facility], you and your health needs are our priority.  As part of our continuing mission to provide you with exceptional heart care, we have created designated Provider Care Teams.  These Care Teams include your primary Cardiologist (physician) and Advanced Practice Providers (APPs -  Physician Assistants and Nurse Practitioners) who all work together to provide you with the care you need, when you need it.  Your next appointment:   4 week(s)  Provider:   Meriam Sprague, MD  or APP  Low-Sodium Eating Plan Sodium, which is an element that makes up salt, helps you maintain a healthy balance of fluids in your body. Too much sodium can increase your blood pressure and cause fluid and waste to be held in your body. Your health care provider or dietitian may recommend following this plan if you have high blood pressure (hypertension), kidney disease, liver disease, or heart failure. Eating less sodium can help lower your blood pressure, reduce swelling, and protect your heart, liver, and kidneys. What are tips for following this plan? Reading food labels The Nutrition Facts label lists the amount of sodium in one serving of the food. If you eat more than one serving, you must multiply the listed amount of sodium by the number of servings. Choose foods with  less than 140 mg of sodium per serving. Avoid foods with 300 mg of sodium or more per serving. Shopping  Look for lower-sodium products, often labeled as "low-sodium" or "no salt added." Always check the sodium content, even if foods are labeled as "unsalted" or "no salt added." Buy fresh foods. Avoid canned foods and pre-made or frozen meals. Avoid canned, cured, or processed meats. Buy breads that have less than 80 mg of sodium per slice. Cooking  Eat more home-cooked food and less restaurant, buffet, and fast food. Avoid adding salt when cooking. Use salt-free seasonings or herbs instead of table salt or sea salt. Check with your health care provider or pharmacist before using salt substitutes. Cook with plant-based oils, such as canola, sunflower, or olive oil. Meal planning When eating at a restaurant, ask that your food be prepared with less salt or no salt, if possible. Avoid dishes labeled as brined, pickled, cured, smoked, or made with soy sauce, miso, or teriyaki sauce. Avoid foods that contain MSG (monosodium glutamate). MSG is sometimes added to [place] food, bouillon, and some canned foods. Make meals that can be grilled, baked, poached, roasted, or steamed. These are generally made with less sodium. General information Most people on this plan should limit their sodium intake to 1,500-2,000 mg (milligrams) of sodium each day. What foods should I eat? Fruits Fresh, frozen, or canned fruit. Fruit juice. Vegetables Fresh or frozen vegetables. "No salt added" canned vegetables. "No salt added" tomato sauce and paste. Low-sodium or reduced-sodium tomato and vegetable  juice. Grains Low-sodium cereals, including oats, puffed wheat and rice, and shredded wheat. Low-sodium crackers. Unsalted rice. Unsalted pasta. Low-sodium bread. Whole-grain breads and whole-grain pasta. Meats and other proteins Fresh or frozen (no salt added) meat, poultry, seafood, and fish. Low-sodium canned  tuna and salmon. Unsalted nuts. Dried peas, beans, and lentils without added salt. Unsalted canned beans. Eggs. Unsalted nut butters. Dairy Milk. Soy milk. Cheese that is naturally low in sodium, such as ricotta cheese, fresh mozzarella, or Swiss cheese. Low-sodium or reduced-sodium cheese. Cream cheese. Yogurt. Seasonings and condiments Fresh and dried herbs and spices. Salt-free seasonings. Low-sodium mustard and ketchup. Sodium-free salad dressing. Sodium-free light mayonnaise. Fresh or refrigerated horseradish. Lemon juice. Vinegar. Other foods Homemade, reduced-sodium, or low-sodium soups. Unsalted popcorn and pretzels. Low-salt or salt-free chips. The items listed above may not be a complete list of foods and beverages you can eat. Contact a dietitian for more information. What foods should I avoid? Vegetables Sauerkraut, pickled vegetables, and relishes. Olives. [place] fries. Onion rings. Regular canned vegetables (not low-sodium or reduced-sodium). Regular canned tomato sauce and paste (not low-sodium or reduced-sodium). Regular tomato and vegetable juice (not low-sodium or reduced-sodium). Frozen vegetables in sauces. Grains Instant hot cereals. Bread stuffing, pancake, and biscuit mixes. Croutons. Seasoned rice or pasta mixes. Noodle soup cups. Boxed or frozen macaroni and cheese. Regular salted crackers. Self-rising flour. Meats and other proteins Meat or fish that is salted, canned, smoked, spiced, or pickled. Precooked or cured meat, such as sausages or meat loaves. Joshua Howell. Ham. Pepperoni. Hot dogs. Corned beef. Chipped beef. Salt pork. Jerky. Pickled herring. Anchovies and sardines. Regular canned tuna. Salted nuts. Dairy Processed cheese and cheese spreads. Hard cheeses. Cheese curds. Blue cheese. Feta cheese. String cheese. Regular cottage cheese. Buttermilk. Canned milk. Fats and oils Salted butter. Regular margarine. Ghee. Bacon fat. Seasonings and condiments Onion salt, garlic  salt, seasoned salt, table salt, and sea salt. Canned and packaged gravies. Worcestershire sauce. Tartar sauce. Barbecue sauce. Teriyaki sauce. Soy sauce, including reduced-sodium. Steak sauce. Fish sauce. Oyster sauce. Cocktail sauce. Horseradish that you find on the shelf. Regular ketchup and mustard. Meat flavorings and tenderizers. Bouillon cubes. Hot sauce. Pre-made or packaged marinades. Pre-made or packaged taco seasonings. Relishes. Regular salad dressings. Salsa. Other foods Salted popcorn and pretzels. Corn chips and puffs. Potato and tortilla chips. Canned or dried soups. Pizza. Frozen entrees and pot pies. The items listed above may not be a complete list of foods and beverages you should avoid. Contact a dietitian for more information. Summary Eating less sodium can help lower your blood pressure, reduce swelling, and protect your heart, liver, and kidneys. Most people on this plan should limit their sodium intake to 1,500-2,000 mg (milligrams) of sodium each day. Canned, boxed, and frozen foods are high in sodium. Restaurant foods, fast foods, and pizza are also very high in sodium. You also get sodium by adding salt to food. Try to cook at home, eat more fresh fruits and vegetables, and eat less fast food and canned, processed, or prepared foods. This information is not intended to replace advice given to you by your health care provider. Make sure you discuss any questions you have with your health care provider. Document Revised: 04/06/2019 Document Reviewed: 04/01/2019 Elsevier Patient Education  2023 [employer]  Heart-Healthy Eating Plan Many factors influence your heart health, including eating and exercise habits. Heart health is also called coronary health. Coronary risk increases with abnormal blood fat (lipid) levels. A heart-healthy eating plan includes limiting unhealthy  fats, increasing healthy fats, limiting salt (sodium) intake, and making other diet and lifestyle  changes. What is my plan? Your health care provider may recommend that: You limit your fat intake to _________% or less of your total calories each day. You limit your saturated fat intake to _________% or less of your total calories each day. You limit the amount of cholesterol in your diet to less than _________ mg per day. You limit the amount of sodium in your diet to less than _________ mg per day. What are tips for following this plan? Cooking Cook foods using methods other than frying. Baking, boiling, grilling, and broiling are all good options. Other ways to reduce fat include: Removing the skin from poultry. Removing all visible fats from meats. Steaming vegetables in water or broth. Meal planning  At meals, imagine dividing your plate into fourths: Fill one-half of your plate with vegetables and green salads. Fill one-fourth of your plate with whole grains. Fill one-fourth of your plate with lean protein foods. Eat 2-4 cups of vegetables per day. One cup of vegetables equals 1 cup (91 g) broccoli or cauliflower florets, 2 medium carrots, 1 large bell pepper, 1 large sweet potato, 1 large tomato, 1 medium white potato, 2 cups (150 g) raw leafy greens. Eat 1-2 cups of fruit per day. One cup of fruit equals 1 small apple, 1 large banana, 1 cup (237 g) mixed fruit, 1 large orange,  cup (82 g) dried fruit, 1 cup (240 mL) 100% fruit juice. Eat more foods that contain soluble fiber. Examples include apples, broccoli, carrots, beans, peas, and barley. Aim to get 25-30 g of fiber per day. Increase your consumption of legumes, nuts, and seeds to 4-5 servings per week. One serving of dried beans or legumes equals  cup (90 g) cooked, 1 serving of nuts is  oz (12 almonds, 24 pistachios, or 7 walnut halves), and 1 serving of seeds equals  oz (8 g). Fats Choose healthy fats more often. Choose monounsaturated and polyunsaturated fats, such as olive and canola oils, avocado oil, flaxseeds,  walnuts, almonds, and seeds. Eat more omega-3 fats. Choose salmon, mackerel, sardines, tuna, flaxseed oil, and ground flaxseeds. Aim to eat fish at least 2 times each week. Check food labels carefully to identify foods with trans fats or high amounts of saturated fat. Limit saturated fats. These are found in animal products, such as meats, butter, and cream. Plant sources of saturated fats include palm oil, palm kernel oil, and coconut oil. Avoid foods with partially hydrogenated oils in them. These contain trans fats. Examples are stick margarine, some tub margarines, cookies, crackers, and other baked goods. Avoid fried foods. General information Eat more home-cooked food and less restaurant, buffet, and fast food. Limit or avoid alcohol. Limit foods that are high in added sugar and simple starches such as foods made using white refined flour (white breads, pastries, sweets). Lose weight if you are overweight. Losing just 5-10% of your body weight can help your overall health and prevent diseases such as diabetes and heart disease. Monitor your sodium intake, especially if you have high blood pressure. Talk with your health care provider about your sodium intake. Try to incorporate more vegetarian meals weekly. What foods should I eat? Fruits All fresh, canned (in natural juice), or frozen fruits. Vegetables Fresh or frozen vegetables (raw, steamed, roasted, or grilled). Green salads. Grains Most grains. Choose whole wheat and whole grains most of the time. Rice and pasta, including brown rice and  pastas made with whole wheat. Meats and other proteins Lean, well-trimmed beef, veal, pork, and lamb. Chicken and [place] without skin. All fish and shellfish. Wild duck, rabbit, pheasant, and venison. Egg whites or low-cholesterol egg substitutes. Dried beans, peas, lentils, and tofu. Seeds and most nuts. Dairy Low-fat or nonfat cheeses, including ricotta and mozzarella. Skim or 1% milk (liquid,  powdered, or evaporated). Buttermilk made with low-fat milk. Nonfat or low-fat yogurt. Fats and oils Non-hydrogenated (trans-free) margarines. Vegetable oils, including soybean, sesame, sunflower, olive, avocado, peanut, safflower, corn, canola, and cottonseed. Salad dressings or mayonnaise made with a vegetable oil. Beverages Water (mineral or sparkling). Coffee and tea. Unsweetened ice tea. Diet beverages. Sweets and desserts Sherbet, gelatin, and fruit ice. Small amounts of dark chocolate. Limit all sweets and desserts. Seasonings and condiments All seasonings and condiments. The items listed above may not be a complete list of foods and beverages you can eat. Contact a dietitian for more options. What foods should I avoid? Fruits Canned fruit in heavy syrup. Fruit in cream or butter sauce. Fried fruit. Limit coconut. Vegetables Vegetables cooked in cheese, cream, or butter sauce. Fried vegetables. Grains Breads made with saturated or trans fats, oils, or whole milk. Croissants. Sweet rolls. Donuts. High-fat crackers, such as cheese crackers and chips. Meats and other proteins Fatty meats, such as hot dogs, ribs, sausage, bacon, rib-eye roast or steak. High-fat deli meats, such as salami and bologna. Caviar. Domestic duck and goose. Organ meats, such as liver. Dairy Cream, sour cream, cream cheese, and creamed cottage cheese. Whole-milk cheeses. Whole or 2% milk (liquid, evaporated, or condensed). Whole buttermilk. Cream sauce or high-fat cheese sauce. Whole-milk yogurt. Fats and oils Meat fat, or shortening. Cocoa butter, hydrogenated oils, palm oil, coconut oil, palm kernel oil. Solid fats and shortenings, including bacon fat, salt pork, lard, and butter. Nondairy cream substitutes. Salad dressings with cheese or sour cream. Beverages Regular sodas and any drinks with added sugar. Sweets and desserts Frosting. Pudding. Cookies. Cakes. Pies. Milk chocolate or white chocolate. Buttered  syrups. Full-fat ice cream or ice cream drinks. The items listed above may not be a complete list of foods and beverages to avoid. Contact a dietitian for more information. Summary Heart-healthy meal planning includes limiting unhealthy fats, increasing healthy fats, limiting salt (sodium) intake and making other diet and lifestyle changes. Lose weight if you are overweight. Losing just 5-10% of your body weight can help your overall health and prevent diseases such as diabetes and heart disease. Focus on eating a balance of foods, including fruits and vegetables, low-fat or nonfat dairy, lean protein, nuts and legumes, whole grains, and heart-healthy oils and fats. This information is not intended to replace advice given to you by your health care provider. Make sure you discuss any questions you have with your health care provider. Document Revised: 06/05/2021 Document Reviewed: 06/05/2021 Elsevier Patient Education  2023 [employer].

## 2022-09-03 NOTE — Progress Notes (Signed)
Office Visit    Patient Name: Joshua Howell Date of Encounter: 09/03/2022  PCP:  Genia Del   [facility]  Cardiologist:  Meriam Sprague, MD  Advanced Practice Provider:  No care team member to display Electrophysiologist:  None   HPI    Joshua Howell is a 62 y.o. male with past medical history of CAD (NSTEMI 2018 status post CABG x 4), carotid artery stenosis, asthma, hyperlipidemia, ED, DM 2, HFmrEF presents today for follow-up visit.  History of CABG in 2018 which involved LIMA to LAD, SVG to ramus intermediate, sequential SVG to PDA and PVLB.  Establish heart care on thousand 18 following CABG.  Most recent was evaluated 04/20/2020 by Herma Carson, PA for DOT physical.  Lexiscan Myoview was low risk without ischemia.  He was seen by [name], NP on 09/01/2022 and at that time he was also present for follow-up for CAD and DOT physical.  Been doing well from a cardiovascular standpoint.  Typically seen every few years as needed for DOT evaluation.  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  Denies chest pain, palpitations, dyspnea, PND, orthopnea, nausea/vomiting, dizziness, syncope, edema, weight gain, or early satiety.  Today, he presents for follow-up to discuss stress test.  The patient denies any chest pain or shortness of breath.  He does occasionally suffer from allergies.  He does laborious work and that involves shoveling and hand digging at times.  He also runs an Financial trader.  He does not ever have any symptoms when exerting himself.  We reviewed his abnormal stress test and the possibility and need for cardiac catheterization.  We went through the risk associated with cardiac catheterization.  He has had 1 in the past with this for several years before his CABG back in 2018.  Ultimately, we decided to wait for his echocardiogram results to come back next week before deciding on cardiac  catheterization.  Reports no shortness of breath nor dyspnea on exertion. Reports no chest pain, pressure, or tightness. No edema, orthopnea, PND. Reports no palpitations.   Past Medical History    Past Medical History:  Diagnosis Date   CAD (coronary artery disease)    a. NSTEMI 01/2017 s/p CABG.   Carotid arterial disease    a. mild 1-39% 01/2017.   Dyslipidemia    Hx of CABG    Hyperlipidemia    Migraine    "only in [employer]" (01/23/2017)   Morbid obesity    NSTEMI (non-ST elevated myocardial infarction) 01/23/2017   type 2 diabetes 07/2016   Past Surgical History:  Procedure Laterality Date   ANTERIOR CERVICAL DECOMP/DISCECTOMY FUSION     "took a piece off my right hip hip & put it in there"   BACK SURGERY     CORONARY ARTERY BYPASS GRAFT N/A 01/28/2017   Procedure: CORONARY ARTERY BYPASS GRAFTING (CABG ) x 4 USING LEFT INTERNAL MAMMARY ARTERY AND ENDOSCOPIC HARVESTING OF RIGHT SAPHENOUS VEIN,  LIMA-LAD SVG-RAMUS SEQ SVG-PD-PL;  Surgeon: [name], MD;  Location: [facility] OR;  Service: Open Heart Surgery;  Laterality: N/A;   LEFT HEART CATH AND CORONARY ANGIOGRAPHY N/A 01/24/2017   Procedure: LEFT HEART CATH AND CORONARY ANGIOGRAPHY;  Surgeon: Corky Crafts, MD;  Location: [facility] INVASIVE CV LAB;  Service: Cardiovascular;  Laterality: N/A;   TEE WITHOUT CARDIOVERSION N/A 01/28/2017   Procedure: TRANSESOPHAGEAL ECHOCARDIOGRAM (TEE);  Surgeon: [name], MD;  Location: [facility]  OR;  Service: Open Heart Surgery;  Laterality: N/A;    Allergies  No Known Allergies  EKGs/Labs/Other Studies Reviewed:   The following studies were reviewed today:  Lexiscan Myoview 08/27/2022 Findings are consistent with infarction with peri-infarct ischemia. The study is intermediate risk.   No ST deviation was noted.   LV perfusion is abnormal. There is evidence of ischemia. There is evidence of infarction. Defect 1: There is a medium defect with severe reduction in uptake  present in the apical lateral location(s) that is fixed. There is abnormal wall motion in the defect area. Consistent with infarction and ischemia.   Left ventricular function is abnormal. Global function is mildly reduced. Nuclear stress EF: 44 %. The left ventricular ejection fraction is moderately decreased (30-44%). End diastolic cavity size is mildly enlarged. End systolic cavity size is mildly enlarged.   Prior study available for comparison from 04/29/2020.   Abnormal  intermediate risk stress nuclear study with prior inferior apical infarct.  There also appears to be very mild ischemia in the inferior wall.  Gated ejection fraction 44% with hypokinesis of the inferior and apical wall.  EKG:  EKG is not ordered today.   Recent Labs: 07/25/2022: ALT 27; BUN 19; Creatinine, Ser 1.19; Hemoglobin 15.5; Platelets 146; Potassium 4.5; Sodium 141  Recent Lipid Panel    Component Value Date/Time   CHOL 98 (L) 07/25/2022 1509   TRIG 124 07/25/2022 1509   HDL 45 07/25/2022 1509   CHOLHDL 2.2 07/25/2022 1509   CHOLHDL 3.8 01/24/2017 0640   VLDL 26 01/24/2017 0640   LDLCALC 31 07/25/2022 1509    Home Medications   Current Meds  Medication Sig   albuterol (VENTOLIN HFA) 108 (90 Base) MCG/ACT inhaler Inhale 2 puffs into the lungs every 6 (six) hours as needed for wheezing or shortness of breath.   aspirin EC (ASPIRIN LOW DOSE) 81 MG tablet Take 1 tablet (81 mg total) by mouth daily.   atorvastatin (LIPITOR) 80 MG tablet TAKE 1 TABLET BY MOUTH  DAILY AT 6 PM.   empagliflozin (JARDIANCE) 10 MG TABS tablet Take 1 tablet (10 mg total) by mouth daily before breakfast.   ibuprofen (ADVIL) 800 MG tablet Take 1 tablet (800 mg total) by mouth 2 (two) times daily as needed.   metFORMIN (GLUCOPHAGE XR) 750 MG 24 hr tablet Take 1 tablet (750 mg total) by mouth in the morning and at bedtime.   nitroGLYCERIN (NITROSTAT) 0.4 MG SL tablet Place 1 tablet (0.4 mg total) under the tongue every 5 (five) minutes  as needed for chest pain.   tiZANidine (ZANAFLEX) 4 MG tablet Take 1 tablet (4 mg total) by mouth at bedtime as needed for muscle spasms.     Review of Systems      All other systems reviewed and are otherwise negative except as noted above.  Physical Exam    VS:  BP 126/72   Pulse 68   Ht 6' (1.829 m)   Wt 287 lb 9.6 oz (130.5 kg)   SpO2 94%   BMI 39.01 kg/m  , BMI Body mass index is 39.01 kg/m.  Wt Readings from Last 3 Encounters:  09/03/22 287 lb 9.6 oz (130.5 kg)  08/27/22 282 lb (127.9 kg)  08/15/22 282 lb 9.6 oz (128.2 kg)     GEN: Well nourished, well developed, in no acute distress. HEENT: normal. Neck: Supple, no JVD, carotid bruits, or masses. Cardiac: RRR, no murmurs, rubs, or gallops. No clubbing, cyanosis, edema.  Radials/PT  2+ and equal bilaterally.  Respiratory:  Respirations regular and unlabored, clear to auscultation bilaterally. GI: Soft, nontender, nondistended. MS: No deformity or atrophy. Skin: Warm and dry, no rash. Neuro:  Strength and sensation are intact. Psych: Normal affect.  Assessment & Plan    Abnormal stress test -Echocardiogram scheduled for next week -Continue current medication regimen which includes aspirin 81 mg daily, Lipitor 80 mg daily, Jardiance 10 mg daily, and added nitro as needed -Discussed risks and benefits of a cardiac catheterization.  Decided to hold off for now until echocardiogram results next week.  Patient is asymptomatic.  CAD status post CABG x 4 in 2018 -Continue current medication regimen -Blood pressure well-controlled 126/72 -No chest pain or shortness of breath  HFmrEF -Updated echo is scheduled for next week -Euvolemic on exam -Continue current medications -Continue low-sodium, heart healthy diet   The patient understands that risks include but are not limited to stroke (1 in 1000), death (1 in 1000), kidney failure [usually temporary] (1 in 500), bleeding (1 in 200), allergic reaction [possibly  serious] (1 in 200), and agrees to proceed.           Disposition: Follow up 4 weeks with Meriam Sprague, MD or APP.  Signed, Sharlene Dory, PA-C 09/03/2022, 4:59 PM [facility]

## 2022-09-12 ENCOUNTER — Ambulatory Visit (HOSPITAL_COMMUNITY): Payer: BC Managed Care – PPO | Attending: Cardiology

## 2022-09-12 DIAGNOSIS — R9439 Abnormal result of other cardiovascular function study: Secondary | ICD-10-CM | POA: Insufficient documentation

## 2022-09-12 MED ORDER — PERFLUTREN LIPID MICROSPHERE
1.0000 mL | INTRAVENOUS | Status: AC | PRN
  Administered 2022-09-12: 2 mL via INTRAVENOUS

## 2022-09-13 LAB — ECHOCARDIOGRAM COMPLETE
Area-P 1/2: 3.75 cm2
S' Lateral: 3.5 cm

## 2022-10-05 ENCOUNTER — Encounter: Payer: Self-pay | Admitting: Physician Assistant

## 2022-10-05 ENCOUNTER — Ambulatory Visit: Payer: BC Managed Care – PPO | Attending: Physician Assistant | Admitting: Physician Assistant

## 2022-10-05 VITALS — BP 118/64 | HR 70 | Ht 72.0 in | Wt 285.2 lb

## 2022-10-05 DIAGNOSIS — I2510 Atherosclerotic heart disease of native coronary artery without angina pectoris: Secondary | ICD-10-CM

## 2022-10-05 DIAGNOSIS — R9439 Abnormal result of other cardiovascular function study: Secondary | ICD-10-CM | POA: Diagnosis not present

## 2022-10-05 DIAGNOSIS — I5020 Unspecified systolic (congestive) heart failure: Secondary | ICD-10-CM

## 2022-10-05 DIAGNOSIS — N529 Male erectile dysfunction, unspecified: Secondary | ICD-10-CM | POA: Diagnosis not present
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
## 2022-10-05 NOTE — Patient Instructions (Addendum)
Medication Instructions:  Your physician recommends that you continue on your current medications as directed. Please refer to the Current Medication list given to you today.  *If you need a refill on your cardiac medications before your next appointment, please call your pharmacy*  Lab Work: None ordered If you have labs (blood work) drawn today and your tests are completely normal, you will receive your results only by: MyChart Message (if you have MyChart) OR A paper copy in the mail If you have any lab test that is abnormal or we need to change your treatment, we will call you to review the results.  Follow-Up: At [facility], you and your health needs are our priority.  As part of our continuing mission to provide you with exceptional heart care, we have created designated Provider Care Teams.  These Care Teams include your primary Cardiologist (physician) and Advanced Practice Providers (APPs -  Physician Assistants and Nurse Practitioners) who all work together to provide you with the care you need, when you need it.  Your next appointment:   6 month(s)  Provider:   Dr Izora Ribas  Other Instructions You have been referred to urology.    Heart-Healthy Eating Plan Many factors influence your heart health, including eating and exercise habits. Heart health is also called coronary health. Coronary risk increases with abnormal blood fat (lipid) levels. A heart-healthy eating plan includes limiting unhealthy fats, increasing healthy fats, limiting salt (sodium) intake, and making other diet and lifestyle changes. What is my plan? Your health care provider may recommend that: You limit your fat intake to _________% or less of your total calories each day. You limit your saturated fat intake to _________% or less of your total calories each day. You limit the amount of cholesterol in your diet to less than _________ mg per day. You limit the amount of sodium in your diet to  less than _________ mg per day. What are tips for following this plan? Cooking Cook foods using methods other than frying. Baking, boiling, grilling, and broiling are all good options. Other ways to reduce fat include: Removing the skin from poultry. Removing all visible fats from meats. Steaming vegetables in water or broth. Meal planning  At meals, imagine dividing your plate into fourths: Fill one-half of your plate with vegetables and green salads. Fill one-fourth of your plate with whole grains. Fill one-fourth of your plate with lean protein foods. Eat 2-4 cups of vegetables per day. One cup of vegetables equals 1 cup (91 g) broccoli or cauliflower florets, 2 medium carrots, 1 large bell pepper, 1 large sweet potato, 1 large tomato, 1 medium white potato, 2 cups (150 g) raw leafy greens. Eat 1-2 cups of fruit per day. One cup of fruit equals 1 small apple, 1 large banana, 1 cup (237 g) mixed fruit, 1 large orange,  cup (82 g) dried fruit, 1 cup (240 mL) 100% fruit juice. Eat more foods that contain soluble fiber. Examples include apples, broccoli, carrots, beans, peas, and barley. Aim to get 25-30 g of fiber per day. Increase your consumption of legumes, nuts, and seeds to 4-5 servings per week. One serving of dried beans or legumes equals  cup (90 g) cooked, 1 serving of nuts is  oz (12 almonds, 24 pistachios, or 7 walnut halves), and 1 serving of seeds equals  oz (8 g). Fats Choose healthy fats more often. Choose monounsaturated and polyunsaturated fats, such as olive and canola oils, avocado oil, flaxseeds, walnuts, almonds,  and seeds. Eat more omega-3 fats. Choose salmon, mackerel, sardines, tuna, flaxseed oil, and ground flaxseeds. Aim to eat fish at least 2 times each week. Check food labels carefully to identify foods with trans fats or high amounts of saturated fat. Limit saturated fats. These are found in animal products, such as meats, butter, and cream. Plant sources of  saturated fats include palm oil, palm kernel oil, and coconut oil. Avoid foods with partially hydrogenated oils in them. These contain trans fats. Examples are stick margarine, some tub margarines, cookies, crackers, and other baked goods. Avoid fried foods. General information Eat more home-cooked food and less restaurant, buffet, and fast food. Limit or avoid alcohol. Limit foods that are high in added sugar and simple starches such as foods made using white refined flour (white breads, pastries, sweets). Lose weight if you are overweight. Losing just 5-10% of your body weight can help your overall health and prevent diseases such as diabetes and heart disease. Monitor your sodium intake, especially if you have high blood pressure. Talk with your health care provider about your sodium intake. Try to incorporate more vegetarian meals weekly. What foods should I eat? Fruits All fresh, canned (in natural juice), or frozen fruits. Vegetables Fresh or frozen vegetables (raw, steamed, roasted, or grilled). Green salads. Grains Most grains. Choose whole wheat and whole grains most of the time. Rice and pasta, including brown rice and pastas made with whole wheat. Meats and other proteins Lean, well-trimmed beef, veal, pork, and lamb. Chicken and [place] without skin. All fish and shellfish. Wild duck, rabbit, pheasant, and venison. Egg whites or low-cholesterol egg substitutes. Dried beans, peas, lentils, and tofu. Seeds and most nuts. Dairy Low-fat or nonfat cheeses, including ricotta and mozzarella. Skim or 1% milk (liquid, powdered, or evaporated). Buttermilk made with low-fat milk. Nonfat or low-fat yogurt. Fats and oils Non-hydrogenated (trans-free) margarines. Vegetable oils, including soybean, sesame, sunflower, olive, avocado, peanut, safflower, corn, canola, and cottonseed. Salad dressings or mayonnaise made with a vegetable oil. Beverages Water (mineral or sparkling). Coffee and tea.  Unsweetened ice tea. Diet beverages. Sweets and desserts Sherbet, gelatin, and fruit ice. Small amounts of dark chocolate. Limit all sweets and desserts. Seasonings and condiments All seasonings and condiments. The items listed above may not be a complete list of foods and beverages you can eat. Contact a dietitian for more options. What foods should I avoid? Fruits Canned fruit in heavy syrup. Fruit in cream or butter sauce. Fried fruit. Limit coconut. Vegetables Vegetables cooked in cheese, cream, or butter sauce. Fried vegetables. Grains Breads made with saturated or trans fats, oils, or whole milk. Croissants. Sweet rolls. Donuts. High-fat crackers, such as cheese crackers and chips. Meats and other proteins Fatty meats, such as hot dogs, ribs, sausage, bacon, rib-eye roast or steak. High-fat deli meats, such as salami and bologna. Caviar. Domestic duck and goose. Organ meats, such as liver. Dairy Cream, sour cream, cream cheese, and creamed cottage cheese. Whole-milk cheeses. Whole or 2% milk (liquid, evaporated, or condensed). Whole buttermilk. Cream sauce or high-fat cheese sauce. Whole-milk yogurt. Fats and oils Meat fat, or shortening. Cocoa butter, hydrogenated oils, palm oil, coconut oil, palm kernel oil. Solid fats and shortenings, including bacon fat, salt pork, lard, and butter. Nondairy cream substitutes. Salad dressings with cheese or sour cream. Beverages Regular sodas and any drinks with added sugar. Sweets and desserts Frosting. Pudding. Cookies. Cakes. Pies. Milk chocolate or white chocolate. Buttered syrups. Full-fat ice cream or ice cream drinks. The items  listed above may not be a complete list of foods and beverages to avoid. Contact a dietitian for more information. Summary Heart-healthy meal planning includes limiting unhealthy fats, increasing healthy fats, limiting salt (sodium) intake and making other diet and lifestyle changes. Lose weight if you are  overweight. Losing just 5-10% of your body weight can help your overall health and prevent diseases such as diabetes and heart disease. Focus on eating a balance of foods, including fruits and vegetables, low-fat or nonfat dairy, lean protein, nuts and legumes, whole grains, and heart-healthy oils and fats. This information is not intended to replace advice given to you by your health care provider. Make sure you discuss any questions you have with your health care provider. Document Revised: 06/05/2021 Document Reviewed: 06/05/2021 Elsevier Patient Education  2024 Else[employer] Inc.  Low-Sodium Eating Plan Salt (sodium) helps you keep a healthy balance of fluids in your body. Too much sodium can raise your blood pressure. It can also cause fluid and waste to be held in your body. Your health care provider or dietitian may recommend a low-sodium eating plan if you have high blood pressure (hypertension), kidney disease, liver disease, or heart failure. Eating less sodium can help lower your blood pressure and reduce swelling. It can also protect your heart, liver, and kidneys. What are tips for following this plan? Reading food labels  Check food labels for the amount of sodium per serving. If you eat more than one serving, you must multiply the listed amount by the number of servings. Choose foods with less than 140 milligrams (mg) of sodium per serving. Avoid foods with 300 mg of sodium or more per serving. Always check how much sodium is in a product, even if the label says "unsalted" or "no salt added." Shopping  Buy products labeled as "low-sodium" or "no salt added." Buy fresh foods. Avoid canned foods and pre-made or frozen meals. Avoid canned, cured, or processed meats. Buy breads that have less than 80 mg of sodium per slice. Cooking  Eat more home-cooked food. Try to eat less restaurant, buffet, and fast food. Try not to add salt when you cook. Use salt-free seasonings or herbs  instead of table salt or sea salt. Check with your provider or pharmacist before using salt substitutes. Cook with plant-based oils, such as canola, sunflower, or olive oil. Meal planning When eating at a restaurant, ask if your food can be made with less salt or no salt. Avoid dishes labeled as brined, pickled, cured, or smoked. Avoid dishes made with soy sauce, miso, or teriyaki sauce. Avoid foods that have monosodium glutamate (MSG) in them. MSG may be added to some restaurant food, sauces, soups, bouillon, and canned foods. Make meals that can be grilled, baked, poached, roasted, or steamed. These are often made with less sodium. General information Try to limit your sodium intake to 1,500-2,300 mg each day, or the amount told by your provider. What foods should I eat? Fruits Fresh, frozen, or canned fruit. Fruit juice. Vegetables Fresh or frozen vegetables. "No salt added" canned vegetables. "No salt added" tomato sauce and paste. Low-sodium or reduced-sodium tomato and vegetable juice. Grains Low-sodium cereals, such as oats, puffed wheat and rice, and shredded wheat. Low-sodium crackers. Unsalted rice. Unsalted pasta. Low-sodium bread. Whole grain breads and whole grain pasta. Meats and other proteins Fresh or frozen meat, poultry, seafood, and fish. These should have no added salt. Low-sodium canned tuna and salmon. Unsalted nuts. Dried peas, beans, and lentils without added salt.  Unsalted canned beans. Eggs. Unsalted nut butters. Dairy Milk. Soy milk. Cheese that is naturally low in sodium, such as ricotta cheese, fresh mozzarella, or Swiss cheese. Low-sodium or reduced-sodium cheese. Cream cheese. Yogurt. Seasonings and condiments Fresh and dried herbs and spices. Salt-free seasonings. Low-sodium mustard and ketchup. Sodium-free salad dressing. Sodium-free light mayonnaise. Fresh or refrigerated horseradish. Lemon juice. Vinegar. Other foods Homemade, reduced-sodium, or low-sodium  soups. Unsalted popcorn and pretzels. Low-salt or salt-free chips. The items listed above may not be all the foods and drinks you can have. Talk to a dietitian to learn more. What foods should I avoid? Vegetables Sauerkraut, pickled vegetables, and relishes. Olives. [place] fries. Onion rings. Regular canned vegetables, except low-sodium or reduced-sodium items. Regular canned tomato sauce and paste. Regular tomato and vegetable juice. Frozen vegetables in sauces. Grains Instant hot cereals. Bread stuffing, pancake, and biscuit mixes. Croutons. Seasoned rice or pasta mixes. Noodle soup cups. Boxed or frozen macaroni and cheese. Regular salted crackers. Self-rising flour. Meats and other proteins Meat or fish that is salted, canned, smoked, spiced, or pickled. Precooked or cured meat, such as sausages or meat loaves. Tomasa Blase. Ham. Pepperoni. Hot dogs. Corned beef. Chipped beef. Salt pork. Jerky. Pickled herring, anchovies, and sardines. Regular canned tuna. Salted nuts. Dairy Processed cheese and cheese spreads. Hard cheeses. Cheese curds. Blue cheese. Feta cheese. String cheese. Regular cottage cheese. Buttermilk. Canned milk. Fats and oils Salted butter. Regular margarine. Ghee. Bacon fat. Seasonings and condiments Onion salt, garlic salt, seasoned salt, table salt, and sea salt. Canned and packaged gravies. Worcestershire sauce. Tartar sauce. Barbecue sauce. Teriyaki sauce. Soy sauce, including reduced-sodium soy sauce. Steak sauce. Fish sauce. Oyster sauce. Cocktail sauce. Horseradish that you find on the shelf. Regular ketchup and mustard. Meat flavorings and tenderizers. Bouillon cubes. Hot sauce. Pre-made or packaged marinades. Pre-made or packaged taco seasonings. Relishes. Regular salad dressings. Salsa. Other foods Salted popcorn and pretzels. Corn chips and puffs. Potato and tortilla chips. Canned or dried soups. Pizza. Frozen entrees and pot pies. The items listed above may not be all the  foods and drinks you should avoid. Talk to a dietitian to learn more. This information is not intended to replace advice given to you by your health care provider. Make sure you discuss any questions you have with your health care provider. Document Revised: 05/17/2022 Document Reviewed: 05/17/2022 Elsevier Patient Education  2024 [employer].

## 2022-11-16 ENCOUNTER — Encounter: Payer: Self-pay | Admitting: *Deleted

## 2022-11-16 NOTE — Progress Notes (Signed)
[facility] Quality Team Note  Name: Joshua Howell Date of Birth: [birth year] MRN: [id number] Date: 11/16/2022  [facility] Quality Team has reviewed this patient's chart, please see recommendations below:  [facility] Quality Other; Pt has open gap for A1C.  Last one out of range.  Would need before end of year.  If compliant, then it would close gap.  Would pt need to have ov or could this be ordered and completed without ov?

## 2022-11-19 NOTE — Progress Notes (Signed)
Pt scheduled for October for a visit. He is busy in August and September

## 2022-11-21 ENCOUNTER — Other Ambulatory Visit: Payer: Self-pay | Admitting: Medical

## 2022-11-26 ENCOUNTER — Encounter: Payer: Self-pay | Admitting: Internal Medicine

## 2023-01-10 ENCOUNTER — Other Ambulatory Visit: Payer: Self-pay | Admitting: Medical

## 2023-02-26 ENCOUNTER — Encounter: Payer: Self-pay | Admitting: Medical

## 2023-02-26 ENCOUNTER — Ambulatory Visit: Payer: BC Managed Care – PPO | Admitting: Medical

## 2023-02-26 VITALS — BP 120/70 | HR 89 | Wt 276.0 lb

## 2023-02-26 DIAGNOSIS — Z2820 Immunization not carried out because of patient decision for unspecified reason: Secondary | ICD-10-CM

## 2023-02-26 DIAGNOSIS — E1169 Type 2 diabetes mellitus with other specified complication: Secondary | ICD-10-CM | POA: Diagnosis not present

## 2023-02-26 DIAGNOSIS — Z951 Presence of aortocoronary bypass graft: Secondary | ICD-10-CM

## 2023-02-26 DIAGNOSIS — J4520 Mild intermittent asthma, uncomplicated: Secondary | ICD-10-CM | POA: Diagnosis not present

## 2023-02-26 DIAGNOSIS — E782 Mixed hyperlipidemia: Secondary | ICD-10-CM | POA: Diagnosis not present

## 2023-02-26 LAB — POCT GLYCOSYLATED HEMOGLOBIN (HGB A1C): Hemoglobin A1C: 6.6 % — AB (ref 4.0–5.6)

## 2023-02-26 NOTE — Progress Notes (Signed)
Subjective:  Joshua Howell is a 62 y.o. male who presents for Chief Complaint  Patient presents with   Medical Management of Chronic Issues    Med check. Declines flu and covid, tdap and shingles. No concerns, no eye exam in the last year     Here for med check.  Diabetes-checks blood sugar sometimes.  Lately numbers have been ranging from low 100s up to 130.  Sometimes if he is eating more unhealthy he will see higher numbers but lately his numbers have looked good.  No particular symptoms of concern.  Compliant with metformin  Hyperlipidemia-compliant with Lipitor 80 mg without complaint along with aspirin 81 mg daily  Asthma-no recent problems.  He does have an inhaler at home if needed  He eats on average 2 meals a day.  In the morning he drinks a Coke 0 and has some crackers.  In the evening he does a link using meal.  He does get some vegetables but not every single day  No other aggravating or relieving factors.    No other c/o.  Past Medical History:  Diagnosis Date   CAD (coronary artery disease)    a. NSTEMI 01/2017 s/p CABG.   Carotid arterial disease (HCC)    a. mild 1-39% 01/2017.   Dyslipidemia    Hx of CABG    Hyperlipidemia    Migraine    "only in [employer] School" (01/23/2017)   Morbid obesity (HCC)    NSTEMI (non-ST elevated myocardial infarction) (HCC) 01/23/2017   type 2 diabetes 07/2016   Current Outpatient Medications on File Prior to Visit  Medication Sig Dispense Refill   albuterol (VENTOLIN HFA) 108 (90 Base) MCG/ACT inhaler USE 2 INHALATIONS BY MOUTH EVERY 6 HOURS AS NEEDED FOR WHEEZING  OR SHORTNESS OF BREATH 34 g 0   aspirin EC (ASPIRIN LOW DOSE) 81 MG tablet Take 1 tablet (81 mg total) by mouth daily. 90 tablet 3   atorvastatin (LIPITOR) 80 MG tablet TAKE 1 TABLET BY MOUTH  DAILY AT 6 PM. 90 tablet 3   metFORMIN (GLUCOPHAGE-XR) 750 MG 24 hr tablet TAKE 1 TABLET BY MOUTH IN THE  MORNING AND AT BEDTIME 180 tablet 0   tiZANidine (ZANAFLEX) 4 MG  tablet Take 1 tablet (4 mg total) by mouth at bedtime as needed for muscle spasms. 20 tablet 0   ibuprofen (ADVIL) 800 MG tablet Take 1 tablet (800 mg total) by mouth 2 (two) times daily as needed. 30 tablet 0   nitroGLYCERIN (NITROSTAT) 0.4 MG SL tablet Place 1 tablet (0.4 mg total) under the tongue every 5 (five) minutes as needed for chest pain. 25 tablet 3   No current facility-administered medications on file prior to visit.     The following portions of the patient's history were reviewed and updated as appropriate: allergies, current medications, past family history, past medical history, past social history, past surgical history and problem list.  ROS Otherwise as in subjective above  Objective: BP 120/70   Pulse 89   Wt 276 lb (125.2 kg)   BMI 37.43 kg/m   General appearance: alert, no distress, well developed, well nourished Heart: RRR, normal S1, S2, no murmurs Lungs: CTA bilaterally, no wheezes, rhonchi, or rales Pulses: 2+ radial pulses, 2+ pedal pulses, normal cap refill Ext: no edema   Assessment: Encounter Diagnoses  Name Primary?   Type 2 diabetes mellitus with other specified complication, unspecified whether long term insulin use (HCC) Yes   Mild intermittent asthma,  unspecified whether complicated    Mixed dyslipidemia    S/P CABG x 4    Vaccine refused by patient      Plan: Diabetes-hemoglobin A1c 6.6% today thankfully, improved.  Continue metformin XR 750 mg 2 tablets daily  Hyperlipidemia-continue Lipitor 80 mg and aspirin 81 mg daily.  Labs reviewed from earlier this year that were at goal  Asthma-no recent problems.  Continue albuterol as needed  He declines vaccines today  Counseled on diet, encouraged him to get more fresh fruits and vegetables in general, more water intake in general  Joshua Howell was seen today for medical management of chronic issues.  Diagnoses and all orders for this visit:  Type 2 diabetes mellitus with other  specified complication, unspecified whether long term insulin use (HCC) -     HgB A1c  Mild intermittent asthma, unspecified whether complicated  Mixed dyslipidemia  S/P CABG x 4  Vaccine refused by patient    Follow up: 13mo

## 2023-03-25 ENCOUNTER — Ambulatory Visit: Payer: BC Managed Care – PPO | Admitting: Internal Medicine

## 2023-04-04 ENCOUNTER — Other Ambulatory Visit: Payer: Self-pay | Admitting: Medical

## 2023-06-04 ENCOUNTER — Encounter: Payer: Self-pay | Admitting: Physician Assistant

## 2023-06-04 ENCOUNTER — Ambulatory Visit: Payer: BC Managed Care – PPO | Attending: Physician Assistant | Admitting: Physician Assistant

## 2023-06-04 VITALS — BP 138/72 | HR 79 | Ht 72.0 in | Wt 284.0 lb

## 2023-06-04 DIAGNOSIS — E782 Mixed hyperlipidemia: Secondary | ICD-10-CM | POA: Diagnosis not present

## 2023-06-04 DIAGNOSIS — I5020 Unspecified systolic (congestive) heart failure: Secondary | ICD-10-CM | POA: Diagnosis not present

## 2023-06-04 DIAGNOSIS — I2510 Atherosclerotic heart disease of native coronary artery without angina pectoris: Secondary | ICD-10-CM

## 2023-06-04 DIAGNOSIS — R9439 Abnormal result of other cardiovascular function study: Secondary | ICD-10-CM

## 2023-06-04 MED ORDER — NITROGLYCERIN 0.4 MG SL SUBL: 0.4000 mg | SUBLINGUAL_TABLET | SUBLINGUAL | 3 refills | Status: AC | PRN
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
## 2023-06-04 NOTE — Patient Instructions (Signed)
Medication Instructions:  Your physician recommends that you continue on your current medications as directed. Please refer to the Current Medication list given to you today. *If you need a refill on your cardiac medications before your next appointment, please call your pharmacy*   Testing/Procedures: Echocardiogram Your physician has requested that you have an echocardiogram. Echocardiography is a painless test that uses sound waves to create images of your heart. It provides your doctor with information about the size and shape of your heart and how well your heart's chambers and valves are working. This procedure takes approximately one hour. There are no restrictions for this procedure. Please do NOT wear cologne, perfume, aftershave, or lotions (deodorant is allowed). Please arrive 15 minutes prior to your appointment time.  Please note: We ask at that you not bring children with you during ultrasound (echo/ vascular) testing. Due to room size and safety concerns, children are not allowed in the ultrasound rooms during exams. Our front office staff cannot provide observation of children in our lobby area while testing is being conducted. An adult accompanying a patient to their appointment will only be allowed in the ultrasound room at the discretion of the ultrasound technician under special circumstances. We apologize for any inconvenience.  Lexiscan Myoview Your physician has requested that you have a lexiscan myoview. For further information please visit [email or web address] Please follow instruction sheet, as given.    Follow-Up: At [facility], you and your health needs are our priority.  As part of our continuing mission to provide you with exceptional heart care, we have created designated Provider Care Teams.  These Care Teams include your primary Cardiologist (physician) and Advanced Practice Providers (APPs -  Physician Assistants and Nurse Practitioners) who all work  together to provide you with the care you need, when you need it.  We recommend signing up for the patient portal called "MyChart".  Sign up information is provided on this After Visit Summary.  MyChart is used to connect with patients for Virtual Visits (Telemedicine).  Patients are able to view lab/test results, encounter notes, upcoming appointments, etc.  Non-urgent messages can be sent to your provider as well.   To learn more about what you can do with MyChart, go to [email or web address].    Your next appointment:   6 week(s)  Provider:   Jari Favre, PA

## 2023-06-05 ENCOUNTER — Encounter (HOSPITAL_COMMUNITY): Payer: Self-pay

## 2023-06-12 ENCOUNTER — Ambulatory Visit (HOSPITAL_COMMUNITY): Payer: BC Managed Care – PPO

## 2023-06-12 ENCOUNTER — Ambulatory Visit (HOSPITAL_COMMUNITY): Payer: BC Managed Care – PPO | Attending: Physician Assistant

## 2023-06-12 DIAGNOSIS — R9439 Abnormal result of other cardiovascular function study: Secondary | ICD-10-CM | POA: Diagnosis not present

## 2023-06-12 DIAGNOSIS — I5020 Unspecified systolic (congestive) heart failure: Secondary | ICD-10-CM | POA: Insufficient documentation

## 2023-06-12 DIAGNOSIS — I2510 Atherosclerotic heart disease of native coronary artery without angina pectoris: Secondary | ICD-10-CM | POA: Insufficient documentation

## 2023-06-12 MED ORDER — TECHNETIUM TC 99M TETROFOSMIN IV KIT
32.2000 | PACK | Freq: Once | INTRAVENOUS | Status: AC | PRN
  Administered 2023-06-12: 32.2 via INTRAVENOUS

## 2023-06-12 MED ORDER — REGADENOSON 0.4 MG/5ML IV SOLN
0.4000 mg | Freq: Once | INTRAVENOUS | Status: AC
  Administered 2023-06-12: 0.4 mg via INTRAVENOUS

## 2023-06-13 ENCOUNTER — Ambulatory Visit (HOSPITAL_COMMUNITY): Payer: BC Managed Care – PPO

## 2023-06-13 LAB — MYOCARDIAL PERFUSION IMAGING
LV dias vol: 117 mL (ref 62–150)
LV sys vol: 62 mL
Nuc Stress EF: 47 %
Peak HR: 88 {beats}/min
Rest HR: 71 {beats}/min
Rest Nuclear Isotope Dose: 32.8 mCi
SDS: 3
SRS: 3
SSS: 7
ST Depression (mm): 0 mm
Stress Nuclear Isotope Dose: 32.2 mCi
TID: 1.09

## 2023-06-13 MED ORDER — TECHNETIUM TC 99M TETROFOSMIN IV KIT
32.8000 | PACK | Freq: Once | INTRAVENOUS | Status: AC | PRN
  Administered 2023-06-13: 32.8 via INTRAVENOUS

## 2023-06-21 ENCOUNTER — Other Ambulatory Visit: Payer: Self-pay | Admitting: Medical

## 2023-06-24 ENCOUNTER — Ambulatory Visit (HOSPITAL_COMMUNITY): Payer: BC Managed Care – PPO | Attending: Physician Assistant

## 2023-06-24 DIAGNOSIS — I5020 Unspecified systolic (congestive) heart failure: Secondary | ICD-10-CM | POA: Insufficient documentation

## 2023-06-24 DIAGNOSIS — R9439 Abnormal result of other cardiovascular function study: Secondary | ICD-10-CM | POA: Diagnosis not present

## 2023-06-24 DIAGNOSIS — I2510 Atherosclerotic heart disease of native coronary artery without angina pectoris: Secondary | ICD-10-CM | POA: Insufficient documentation

## 2023-06-24 LAB — ECHOCARDIOGRAM COMPLETE
Area-P 1/2: 3.83 cm2
S' Lateral: 3.5 cm

## 2023-06-24 MED ORDER — PERFLUTREN LIPID MICROSPHERE
1.0000 mL | INTRAVENOUS | Status: AC | PRN
  Administered 2023-06-24: 2 mL via INTRAVENOUS

## 2023-06-26 ENCOUNTER — Encounter: Payer: Self-pay | Admitting: Physician Assistant

## 2023-07-19 ENCOUNTER — Other Ambulatory Visit: Payer: Self-pay | Admitting: Medical

## 2023-08-05 ENCOUNTER — Encounter: Payer: Self-pay | Admitting: Physician Assistant

## 2023-08-05 ENCOUNTER — Ambulatory Visit: Payer: BC Managed Care – PPO | Attending: Physician Assistant | Admitting: Physician Assistant

## 2023-08-05 VITALS — BP 124/72 | HR 103 | Ht 72.0 in | Wt 288.4 lb

## 2023-08-05 DIAGNOSIS — R9439 Abnormal result of other cardiovascular function study: Secondary | ICD-10-CM

## 2023-08-05 DIAGNOSIS — I2510 Atherosclerotic heart disease of native coronary artery without angina pectoris: Secondary | ICD-10-CM

## 2023-08-05 DIAGNOSIS — E782 Mixed hyperlipidemia: Secondary | ICD-10-CM | POA: Diagnosis not present

## 2023-08-05 DIAGNOSIS — I5020 Unspecified systolic (congestive) heart failure: Secondary | ICD-10-CM | POA: Diagnosis not present

## 2023-08-05 DIAGNOSIS — I214 Non-ST elevation (NSTEMI) myocardial infarction: Secondary | ICD-10-CM

## 2023-08-05 NOTE — Patient Instructions (Signed)
 Medication Instructions:  Your physician recommends that you continue on your current medications as directed. Please refer to the Current Medication list given to you today.  *If you need a refill on your cardiac medications before your next appointment, please call your pharmacy*   Lab Work: NONE If you have labs (blood work) drawn today and your tests are completely normal, you will receive your results only by: MyChart Message (if you have MyChart) OR A paper copy in the mail If you have any lab test that is abnormal or we need to change your treatment, we will call you to review the results.   Testing/Procedures: NONE   Follow-Up: At [facility], you and your health needs are our priority.  As part of our continuing mission to provide you with exceptional heart care, we have created designated Provider Care Teams.  These Care Teams include your primary Cardiologist (physician) and Advanced Practice Providers (APPs -  Physician Assistants and Nurse Practitioners) who all work together to provide you with the care you need, when you need it.  We recommend signing up for the patient portal called "MyChart".  Sign up information is provided on this After Visit Summary.  MyChart is used to connect with patients for Virtual Visits (Telemedicine).  Patients are able to view lab/test results, encounter notes, upcoming appointments, etc.  Non-urgent messages can be sent to your provider as well.   To learn more about what you can do with MyChart, go to [email or web address].    Your next appointment:   6 month(s)  Provider:   Jari Favre, PA-C  Other Instructions   1st Floor: - Lobby - Registration  - Pharmacy  - Lab - Cafe  2nd Floor: - PV Lab - Diagnostic Testing (echo, CT, nuclear med)  3rd Floor: - Vacant  4th Floor: - TCTS (cardiothoracic surgery) - AFib Clinic - Structural Heart Clinic - Vascular Surgery  - Vascular Ultrasound  5th Floor: -  HeartCare Cardiology (general and EP) - Clinical Pharmacy for coumadin, hypertension, lipid, weight-loss medications, and med management appointments    Valet parking services will be available as well.
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
## 2023-10-18 ENCOUNTER — Other Ambulatory Visit: Payer: Self-pay | Admitting: Medical

## 2024-01-16 ENCOUNTER — Other Ambulatory Visit: Payer: Self-pay | Admitting: Medical

## 2024-03-05 NOTE — Progress Notes (Signed)
 Joshua Howell                                          MRN: [id number]   03/05/2024   The [facility]BCI Quality Team Specialist reviewed this patient medical record for the purposes of chart review for care gap closure. The following were reviewed: chart review for care gap closure-glycemic status assessment.    [facility]BCI Quality Team

## 2024-03-24 ENCOUNTER — Encounter: Payer: Self-pay | Admitting: Medical

## 2024-03-24 ENCOUNTER — Ambulatory Visit: Admitting: Medical

## 2024-03-24 VITALS — BP 118/72 | HR 76 | Ht 72.0 in | Wt 288.6 lb

## 2024-03-24 DIAGNOSIS — Z125 Encounter for screening for malignant neoplasm of prostate: Secondary | ICD-10-CM | POA: Diagnosis not present

## 2024-03-24 DIAGNOSIS — Z2820 Immunization not carried out because of patient decision for unspecified reason: Secondary | ICD-10-CM

## 2024-03-24 DIAGNOSIS — R29898 Other symptoms and signs involving the musculoskeletal system: Secondary | ICD-10-CM

## 2024-03-24 DIAGNOSIS — E1169 Type 2 diabetes mellitus with other specified complication: Secondary | ICD-10-CM | POA: Diagnosis not present

## 2024-03-24 DIAGNOSIS — T148XXA Other injury of unspecified body region, initial encounter: Secondary | ICD-10-CM

## 2024-03-24 DIAGNOSIS — M542 Cervicalgia: Secondary | ICD-10-CM

## 2024-03-24 DIAGNOSIS — J4520 Mild intermittent asthma, uncomplicated: Secondary | ICD-10-CM | POA: Diagnosis not present

## 2024-03-24 DIAGNOSIS — E782 Mixed hyperlipidemia: Secondary | ICD-10-CM | POA: Diagnosis not present

## 2024-03-24 DIAGNOSIS — G8929 Other chronic pain: Secondary | ICD-10-CM

## 2024-03-24 DIAGNOSIS — Z951 Presence of aortocoronary bypass graft: Secondary | ICD-10-CM

## 2024-03-24 NOTE — Patient Instructions (Signed)
Please go to [facility] for your neck xray.   Their hours are 8am - 4:30 pm Monday - Friday.  Take your insurance card with you.  [facility] [phone]   [address] [place], [place] [address]

## 2024-03-24 NOTE — Progress Notes (Addendum)
 Subjective:  Joshua Howell is a 63 y.o. male who presents for Chief Complaint  Patient presents with   Medical Management of Chronic Issues    Med check, non fasting, declines all shots     Here for med check.  Diabetes - Compliant with metformin  XR 750 mg twice daily.  No recent issues or symptoms.  Hyperlipidemia-compliant with Lipitor  80 mg without complaint along with aspirin  81 mg daily  Asthma-no recent problems.  He does have an inhaler at home if needed.  Spring allergy season can flare things up.  Still has some crick in neck, doesn't seem muscular.  Fingers tingling from time to time.  Has hx/o prior neck surgery.  Pain intermittent  Nonfasting.  Crackers this morning.  Few fritos earlier.    No other aggravating or relieving factors.    No other c/o.  Past Medical History:  Diagnosis Date   CAD (coronary artery disease)    a. NSTEMI 01/2017 s/p CABG.   Carotid arterial disease    a. mild 1-39% 01/2017.   Dyslipidemia    Hx of CABG    Hyperlipidemia    Migraine    only in [employer] (01/23/2017)   Morbid obesity (HCC)    NSTEMI (non-ST elevated myocardial infarction) (HCC) 01/23/2017   type 2 diabetes 07/2016   Current Outpatient Medications on File Prior to Visit  Medication Sig Dispense Refill   albuterol  (VENTOLIN  HFA) 108 (90 Base) MCG/ACT inhaler USE 2 INHALATIONS BY MOUTH EVERY 6 HOURS AS NEEDED FOR WHEEZING  OR SHORTNESS OF BREATH 34 g 0   aspirin  EC (ASPIRIN  LOW DOSE) 81 MG tablet Take 1 tablet (81 mg total) by mouth daily. 90 tablet 3   atorvastatin  (LIPITOR ) 80 MG tablet TAKE 1 TABLET BY MOUTH DAILY AT  6 PM. 90 tablet 0   metFORMIN  (GLUCOPHAGE -XR) 750 MG 24 hr tablet TAKE 1 TABLET BY MOUTH IN THE  MORNING AND AT BEDTIME 180 tablet 0   nitroGLYCERIN  (NITROSTAT ) 0.4 MG SL tablet Place 1 tablet (0.4 mg total) under the tongue every 5 (five) minutes as needed for chest pain. 25 tablet 3   No current facility-administered medications on file  prior to visit.     The following portions of the patient's history were reviewed and updated as appropriate: allergies, current medications, past family history, past medical history, past social history, past surgical history and problem list.  ROS Otherwise as in subjective above  Objective: BP 118/72   Pulse 76   Ht 6' (1.829 m)   Wt 288 lb 9.6 oz (130.9 kg)   BMI 39.14 kg/m   Wt Readings from Last 3 Encounters:  03/24/24 288 lb 9.6 oz (130.9 kg)  08/05/23 288 lb 6.4 oz (130.8 kg)  06/12/23 284 lb (128.8 kg)   BP Readings from Last 3 Encounters:  03/24/24 118/72  08/05/23 124/72  06/04/23 138/72    General appearance: alert, no distress, well developed, well nourished Heart: RRR, normal S1, S2, no murmurs Lungs: CTA bilaterally, no wheezes, rhonchi, or rales Pulses: 2+ radial pulses, 2+ pedal pulses, normal cap refill Ext: no edema Neck: decreased right rotation and ext, otherwise normal ROM, no mass, no lymphadenopathy Skin: Numerous pinkish patches throughout both forearms from excoriations that are healing   Diabetic Foot Exam - Simple   Simple Foot Form Diabetic Foot exam was performed with the following findings: Yes 03/24/2024  4:18 PM  Visual Inspection See comments: Yes Sensation Testing Intact to touch and  monofilament testing bilaterally: Yes Pulse Check Posterior Tibialis and Dorsalis pulse intact bilaterally: Yes Comments Flat feet       Assessment: Encounter Diagnoses  Name Primary?   Type 2 diabetes mellitus with other specified complication, without long-term current use of insulin  (HCC) Yes   Vaccine refused by patient    Mixed dyslipidemia    Screening for prostate cancer    S/P CABG x 4    Mild intermittent asthma, unspecified whether complicated    Chronic neck pain    Decreased ROM of neck    Scratch      Plan: Diabetes-hemoglobin A1c 6.6% today thankfully, improved.  Continue metformin  XR 750 mg 1 tablet  BID  Hyperlipidemia-continue Lipitor  80 mg and aspirin  81 mg daily.  Labs reviewed from earlier this year that were at goal  Asthma-no recent problems.  Continue albuterol  as needed  He declines vaccines today  History of CAD with prior MI and CABG x 4 -reviewed echo from 06/2023, due f/u with cardiology now -no current symptoms, BP controlled -continue current medication  Neck pain, decreased ROM  -consider massage therapy chiropractor or physical therapy consult -Scheduled for updated x-ray  Several healing scratches on both arms from new puppy.  He is wearing more long sleeves and trying to protect his skin  Joshua Howell was seen today for medical management of chronic issues.  Diagnoses and all orders for this visit:  Type 2 diabetes mellitus with other specified complication, without long-term current use of insulin  (HCC) -     CBC -     Lipid panel -     TSH -     Hemoglobin A1c -     Microalbumin/Creatinine Ratio, Urine -     Urinalysis, Routine w reflex microscopic  Vaccine refused by patient  Mixed dyslipidemia -     Comprehensive metabolic panel with GFR -     Lipid panel -     Urinalysis, Routine w reflex microscopic  Screening for prostate cancer -     PSA -     Urinalysis, Routine w reflex microscopic  S/P CABG x 4  Mild intermittent asthma, unspecified whether complicated -     CBC  Chronic neck pain -     DG Cervical Spine Complete; Future  Decreased ROM of neck -     DG Cervical Spine Complete; Future  Scratch  Spent > 45 minutes face to face with patient in discussion of symptoms, evaluation, plan and recommendations.     Follow up: 80mo

## 2024-03-25 ENCOUNTER — Ambulatory Visit: Payer: Self-pay | Admitting: Medical

## 2024-03-25 DIAGNOSIS — R29898 Other symptoms and signs involving the musculoskeletal system: Secondary | ICD-10-CM

## 2024-03-25 DIAGNOSIS — G8929 Other chronic pain: Secondary | ICD-10-CM

## 2024-03-25 LAB — COMPREHENSIVE METABOLIC PANEL WITH GFR
ALT: 22 IU/L (ref 0–44)
AST: 28 IU/L (ref 0–40)
Albumin: 4.3 g/dL (ref 3.9–4.9)
Alkaline Phosphatase: 78 IU/L (ref 47–123)
BUN/Creatinine Ratio: 16 (ref 10–24)
BUN: 16 mg/dL (ref 8–27)
Bilirubin Total: 1.4 mg/dL — ABNORMAL HIGH (ref 0.0–1.2)
CO2: 18 mmol/L — ABNORMAL LOW (ref 20–29)
Calcium: 9.5 mg/dL (ref 8.6–10.2)
Chloride: 103 mmol/L (ref 96–106)
Creatinine, Ser: 1.02 mg/dL (ref 0.76–1.27)
Globulin, Total: 3.1 g/dL (ref 1.5–4.5)
Glucose: 165 mg/dL — ABNORMAL HIGH (ref 70–99)
Potassium: 4.4 mmol/L (ref 3.5–5.2)
Sodium: 139 mmol/L (ref 134–144)
Total Protein: 7.4 g/dL (ref 6.0–8.5)
eGFR: 82 mL/min/1.73 (ref >59)

## 2024-03-25 LAB — CBC
Hematocrit: 45.3 % (ref 37.5–51.0)
Hemoglobin: 15 g/dL (ref 13.0–17.7)
MCH: 32.2 pg (ref 26.6–33.0)
MCHC: 33.1 g/dL (ref 31.5–35.7)
MCV: 97 fL (ref 79–97)
Platelets: 162 x10E3/uL (ref 150–450)
RBC: 4.66 x10E6/uL (ref 4.14–5.80)
RDW: 12.8 % (ref 11.6–15.4)
WBC: 6.9 x10E3/uL (ref 3.4–10.8)

## 2024-03-25 LAB — HEMOGLOBIN A1C
Est. average glucose Bld gHb Est-mCnc: 194 mg/dL
Hgb A1c MFr Bld: 8.4 % — ABNORMAL HIGH (ref 4.8–5.6)

## 2024-03-25 LAB — URINALYSIS, ROUTINE W REFLEX MICROSCOPIC
Bilirubin, UA: NEGATIVE
Ketones, UA: NEGATIVE
Leukocytes,UA: NEGATIVE
Nitrite, UA: NEGATIVE
Protein,UA: NEGATIVE
RBC, UA: NEGATIVE
Specific Gravity, UA: 1.028 (ref 1.005–1.030)
Urobilinogen, Ur: 1 mg/dL (ref 0.2–1.0)
pH, UA: 5 (ref 5.0–7.5)

## 2024-03-25 LAB — MICROALBUMIN / CREATININE URINE RATIO
Creatinine, Urine: 159.8 mg/dL
Microalb/Creat Ratio: 6 mg/g{creat} (ref 0–29)
Microalbumin, Urine: 10.3 ug/mL

## 2024-03-25 LAB — LIPID PANEL
Chol/HDL Ratio: 2.5 ratio (ref 0.0–5.0)
Cholesterol, Total: 102 mg/dL (ref 100–199)
HDL: 41 mg/dL (ref >39)
LDL Chol Calc (NIH): 36 mg/dL (ref 0–99)
Triglycerides: 143 mg/dL (ref 0–149)
VLDL Cholesterol Cal: 25 mg/dL (ref 5–40)

## 2024-03-25 LAB — PSA: Prostate Specific Ag, Serum: 0.8 ng/mL (ref 0.0–4.0)

## 2024-03-25 LAB — TSH: TSH: 2.67 u[IU]/mL (ref 0.450–4.500)

## 2024-03-25 NOTE — Progress Notes (Signed)
 Results through MyChart

## 2024-03-25 NOTE — Progress Notes (Signed)
 Diabetes marker not at goal at 8.4%  Thyroid  lab is okay, prostate lab okay, cholesterol looks good, blood count okay, liver and kidney and electrolytes okay.  Still pending microalbumin marker  Go for neck x-ray  Continue your current medicines but I recommend adding a weekly injectable such as Mounjaro or Ozempic to help lower sugars and help with weight loss..  This is not insulin .  This works pretty effective.  I strongly recommend limiting sugary drinks, junk food, large portions, and excessive amounts of bread Posta and rice.  If not agreeable to the weekly injection and I will need to add something else along with the metformin  to lower your blood sugars

## 2024-04-14 ENCOUNTER — Ambulatory Visit
Admission: RE | Admit: 2024-04-14 | Discharge: 2024-04-14 | Disposition: A | Source: Ambulatory Visit | Attending: Medical | Admitting: Medical

## 2024-04-14 DIAGNOSIS — R29898 Other symptoms and signs involving the musculoskeletal system: Secondary | ICD-10-CM

## 2024-04-14 DIAGNOSIS — G8929 Other chronic pain: Secondary | ICD-10-CM

## 2024-04-14 DIAGNOSIS — M542 Cervicalgia: Secondary | ICD-10-CM | POA: Diagnosis not present

## 2024-04-18 ENCOUNTER — Other Ambulatory Visit: Payer: Self-pay | Admitting: Medical

## 2024-04-21 NOTE — Progress Notes (Signed)
 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  I would recommend trial of physical therapy.  If agreeable we will refer to physical therapy.  If you are having worsening or severe arm pain numbness or tingling then I would recommend referral to orthopedist

## 2024-06-03 ENCOUNTER — Encounter: Payer: Self-pay | Admitting: *Deleted

## 2024-06-04 ENCOUNTER — Ambulatory Visit: Admitting: Internal Medicine

## 2024-06-04 ENCOUNTER — Encounter: Payer: Self-pay | Admitting: Internal Medicine

## 2024-06-04 VITALS — BP 138/62 | HR 77 | Ht 72.0 in | Wt 282.0 lb

## 2024-06-04 DIAGNOSIS — Z951 Presence of aortocoronary bypass graft: Secondary | ICD-10-CM | POA: Diagnosis not present

## 2024-06-04 DIAGNOSIS — I2510 Atherosclerotic heart disease of native coronary artery without angina pectoris: Secondary | ICD-10-CM | POA: Diagnosis not present

## 2024-06-04 DIAGNOSIS — R9439 Abnormal result of other cardiovascular function study: Secondary | ICD-10-CM | POA: Diagnosis not present

## 2024-06-04 DIAGNOSIS — I5020 Unspecified systolic (congestive) heart failure: Secondary | ICD-10-CM

## 2024-06-04 DIAGNOSIS — E782 Mixed hyperlipidemia: Secondary | ICD-10-CM | POA: Diagnosis not present

## 2024-06-04 NOTE — Progress Notes (Signed)
 " Cardiology Office Note:  .   Date:  06/04/2024  ID:  Joshua Howell, DOB 1961/03/26, MRN [id number] PCP: Bulah Alm GORMAN DEVONNA  [facility] Providers Cardiologist:  Powell FORBES Sorrow, MD (Inactive)    History of Present Illness: .     Discussed the use of AI scribe software for clinical note transcription with the patient, who gave verbal consent to proceed.  History of Present Illness Joshua Howell is a 64 year old male with coronary artery disease who presents for an overdue follow-up.  Exertional dyspnea - Increased shortness of breath with exertion, particularly in cold weather - Dyspnea relieved by use of inhaler for asthma - No chest pain, peripheral edema, or orthopnea - Able to sleep on his side without difficulty  Status post-CABG in 2019 with LIMA to LAD, SVG to ramus intermediate, and sequential SVG to PDA and PLB - History of coronary artery disease - Underwent coronary artery bypass grafting in 2019 - No current chest pain - Lexiscan  Myoview  in January 2025 showing findings consistent with LAD infarction and mildly decreased EF at 47%  Hyperlipidemia - Currently taking atorvastatin  - Recent lipid panel (November 2025): total cholesterol 102 mg/dL, HDL 41 mg/dL, LDL 36 mg/dL, triglycerides 856 mg/dL  Type 2 diabetes mellitus - Currently taking metformin  - Recent hemoglobin A1c 8.4%  Chronic cervicalgia and limited range of motion - Chronic neck pain with difficulty turning head, ongoing for over a year - X-rays show significant disc degeneration at C2-C3 and C3-C4 - History of cervical disc fusion 30 years ago - Participating in physical therapy, including arm bike and neck exercises with towel - Finds neck exercises confusing and painful  Functional status - Employed in physically demanding job handling [employer] - 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, manufacturing - Remains active and continues  to work daily despite symptoms          ROS: Remaining review of systems negative  Studies Reviewed: SABRA   EKG Interpretation Date/Time:  Thursday June 04 2024 15:49:46 EST Ventricular Rate:  77 PR Interval:  146 QRS Duration:  144 QT Interval:  408 QTC Calculation: 461 R Axis:   34  Text Interpretation: Sinus rhythm with marked sinus arrhythmia Right bundle branch block T wave abnormality, consider inferior ischemia When compared with ECG of 04-Jun-2023 14:26, Premature atrial complexes are no longer Present Nonspecific T wave abnormality now evident in Anterolateral leads Confirmed by Kriste Hicks [phone]) on 06/04/2024 4:03:32 PM    Results Labs Lipid panel (03/24/2024): Total cholesterol 102, HDL 41, LDL 36, triglycerides 143 Hemoglobin A1c (03/24/2024): 8.4  Diagnostic Echocardiogram (06/24/2023): Left ventricular ejection fraction 50-55%, distal septal apical hypokinesis, moderate biatrial enlargement, mild mitral regurgitation, findings consistent with infarct and high risk infarction in the left anterior descending territory  06/13/2023 Lexiscan  Myoview :   Findings are consistent with infarction. The study is high risk.   No ST deviation was noted.   Left ventricular function is abnormal. Global function is mildly reduced. Nuclear stress EF: 47%. The left ventricular ejection fraction is mildly decreased (45-54%). End diastolic cavity size is normal. End systolic cavity size is normal.   Prior study available for comparison from 08/28/2022. Risk Assessment/Calculations:             Physical Exam:   VS:  BP 138/62   Pulse 77   Ht 6' (1.829 m)   Wt 282 lb (127.9 kg)   SpO2 94%  BMI 38.25 kg/m    Wt Readings from Last 3 Encounters:  06/04/24 282 lb (127.9 kg)  03/24/24 288 lb 9.6 oz (130.9 kg)  08/05/23 288 lb 6.4 oz (130.8 kg)    GEN: Well nourished, well developed in no acute distress NECK: No JVD; No carotid bruits CARDIAC:  RRR, no murmurs, no rubs, no  gallops RESPIRATORY:  Clear to auscultation without rales, wheezing or rhonchi  ABDOMEN: Soft, non-tender, non-distended EXTREMITIES:  No edema; No deformity   ASSESSMENT AND PLAN: .    Assessment and Plan Assessment & Plan Coronary artery disease, post-CABG Status post-CABG in 2019 with LIMA to LAD, SVG to ramus intermediate, and sequential SVG to PDA and PLB. No current chest pain or significant symptoms. Shortness of breath with exertion, particularly in cold weather, managed with inhaler and likely noncardiac related. Blood pressure slightly elevated at 138/62 but worked with physical therapy just prior to coming to the office today and is well-controlled on prior readings. - Continue current medications including aspirin , atorvastatin  - BP goal less than 130/80.  Can consider adding on ACE inhibitor/ARB if needed  Documented history of heart failure with mildly reduced ejection fraction Echocardiogram from November 2025 shows EF 50-55% with distal septal apical hypokinesis, moderate biatrial enlargement, and mild MR. No echocardiogram noting reduced EF on personal review.  EF of 47% on nuclear study which is not the most accurate EF evaluation.  Currently euvolemic.  Shortness of breath with exertion, managed with inhaler. - Continue current management and monitor symptoms.  Mixed hyperlipidemia Lipid panel from November 2025 shows total cholesterol 102, HDL 41, LDL 36, and triglycerides 856. Lipid levels well controlled with current medication regimen. - Continue atorvastatin  for lipid management. - Goal LDL less than 55  Type 2 diabetes mellitus Hemoglobin A1c is 8.4, indicating suboptimal control. Currently managed with metformin . - Continue metformin  for diabetes management. - Would consider starting on ACE inhibitor/ARB            Follow up: 6 months  Signed, Emeline FORBES Calender, DO  06/04/2024 4:20 PM    [facility] "

## 2024-06-04 NOTE — Patient Instructions (Signed)
 Medication Instructions:    Your physician recommends that you continue on your current medications as directed. Please refer to the Current Medication list given to you today.   *If you need a refill on your cardiac medications before your next appointment, please call your pharmacy*   Lab Work:  NONE ORDERED  TODAY    If you have labs (blood work) drawn today and your tests are completely normal, you will receive your results only by: MyChart Message (if you have MyChart) OR A paper copy in the mail If you have any lab test that is abnormal or we need to change your treatment, we will call you to review the results.    Testing/Procedures:  NONE ORDERED  TODAY     Follow-Up: At [facility], you and your health needs are our priority.  As part of our continuing mission to provide you with exceptional heart care, our providers are all part of one team.  This team includes your primary Cardiologist (physician) and Advanced Practice Providers or APPs (Physician Assistants and Nurse Practitioners) who all work together to provide you with the care you need, when you need it.  Your next appointment:   6 month(s)  Provider:  Dr Kriste  We recommend signing up for the patient portal called MyChart.  Sign up information is provided on this After Visit Summary.  MyChart is used to connect with patients for Virtual Visits (Telemedicine).  Patients are able to view lab/test results, encounter notes, upcoming appointments, etc.  Non-urgent messages can be sent to your provider as well.   To learn more about what you can do with MyChart, go to [email or web address].   Other Instructions

## 2024-09-21 ENCOUNTER — Ambulatory Visit: Admitting: Medical
